# Patient Record
Sex: Female | Born: 1975 | Race: White | Hispanic: No | Smoking: Current every day smoker
Health system: Southern US, Community
[De-identification: ages and names within clinical notes are randomized; demographics above are authoritative.]

## PROBLEM LIST (undated history)

## (undated) DIAGNOSIS — F419 Anxiety disorder, unspecified: Secondary | ICD-10-CM

## (undated) DIAGNOSIS — J449 Chronic obstructive pulmonary disease, unspecified: Secondary | ICD-10-CM

## (undated) DIAGNOSIS — K219 Gastro-esophageal reflux disease without esophagitis: Secondary | ICD-10-CM

## (undated) DIAGNOSIS — J45909 Unspecified asthma, uncomplicated: Secondary | ICD-10-CM

## (undated) DIAGNOSIS — F32A Depression, unspecified: Secondary | ICD-10-CM

## (undated) DIAGNOSIS — I639 Cerebral infarction, unspecified: Secondary | ICD-10-CM

## (undated) HISTORY — PX: BLADDER SURGERY: SHX569

## (undated) HISTORY — PX: CHOLECYSTECTOMY: SHX55

---

## 2002-03-06 ENCOUNTER — Other Ambulatory Visit: Admission: RE | Admit: 2002-03-06 | Discharge: 2002-03-06 | Payer: Self-pay

## 2006-07-25 ENCOUNTER — Ambulatory Visit: Payer: Self-pay | Admitting: Cardiology

## 2006-12-26 ENCOUNTER — Inpatient Hospital Stay (HOSPITAL_COMMUNITY): Admission: EM | Admit: 2006-12-26 | Discharge: 2006-12-30 | Payer: Self-pay | Admitting: Emergency Medicine

## 2006-12-27 ENCOUNTER — Encounter (INDEPENDENT_AMBULATORY_CARE_PROVIDER_SITE_OTHER): Payer: Self-pay | Admitting: Cardiology

## 2006-12-27 ENCOUNTER — Encounter: Payer: Self-pay | Admitting: Vascular Surgery

## 2007-07-04 ENCOUNTER — Ambulatory Visit: Payer: Self-pay | Admitting: Cardiology

## 2010-03-17 ENCOUNTER — Emergency Department (HOSPITAL_COMMUNITY): Admission: EM | Admit: 2010-03-17 | Discharge: 2010-03-17 | Payer: Self-pay | Admitting: Emergency Medicine

## 2010-04-19 ENCOUNTER — Emergency Department (HOSPITAL_COMMUNITY): Admission: EM | Admit: 2010-04-19 | Discharge: 2010-04-19 | Payer: Self-pay | Admitting: Emergency Medicine

## 2010-05-23 ENCOUNTER — Emergency Department (HOSPITAL_COMMUNITY): Admission: EM | Admit: 2010-05-23 | Discharge: 2010-05-23 | Payer: Self-pay | Admitting: Emergency Medicine

## 2010-06-02 ENCOUNTER — Emergency Department (HOSPITAL_COMMUNITY): Admission: EM | Admit: 2010-06-02 | Discharge: 2010-06-02 | Payer: Self-pay | Admitting: Emergency Medicine

## 2010-07-21 ENCOUNTER — Emergency Department (HOSPITAL_COMMUNITY): Admission: EM | Admit: 2010-07-21 | Discharge: 2010-07-21 | Payer: Self-pay | Admitting: Emergency Medicine

## 2011-02-18 ENCOUNTER — Emergency Department (HOSPITAL_COMMUNITY)
Admission: EM | Admit: 2011-02-18 | Discharge: 2011-02-18 | Disposition: A | Discharge status: Home / Self Care | Payer: Medicaid Other | Attending: Emergency Medicine | Admitting: Emergency Medicine

## 2011-02-18 DIAGNOSIS — K089 Disorder of teeth and supporting structures, unspecified: Secondary | ICD-10-CM | POA: Insufficient documentation

## 2011-02-18 DIAGNOSIS — K029 Dental caries, unspecified: Secondary | ICD-10-CM | POA: Insufficient documentation

## 2011-02-18 DIAGNOSIS — J45909 Unspecified asthma, uncomplicated: Secondary | ICD-10-CM | POA: Insufficient documentation

## 2011-02-18 DIAGNOSIS — R51 Headache: Secondary | ICD-10-CM | POA: Insufficient documentation

## 2011-04-30 NOTE — H&P (Signed)
[name], [name] NO.:  [id number]   MEDICAL RECORD NO.:  [id number]          PATIENT TYPE:  EMS   LOCATION:  ED                           FACILITY:  [facility]   PHYSICIAN:  Marcellus Scott, MD     DATE OF BIRTH:  [birth year]   DATE OF ADMISSION:  12/26/2006  DATE OF DISCHARGE:                              HISTORY & PHYSICAL   PRIMARY CARE PHYSICIAN:  Dr. Garner Nash in [place]; hence, is unassigned to  the [facility].   CHIEF COMPLAINT:  Left-sided numbness, weakness, and headache.   HISTORY OF PRESENT ILLNESS:  [name] is a 35 year old pleasant  Caucasian female with a past medical history of asthma and anxiety  disorder, who was in her usual state of health until approximately  midday yesterday.  She noticed a sudden onset of numbness beginning  initially in her left fingers, then gradual progression to involve the  entire left half of the body, neck down.  Initially, this was  intermittent numbness, but subsequently noticed persistent numbness in  the left side of the body associated with weakness, says the left lower  extremity was weaker than the left upper extremity and was dragging her  feet.  She says she also noticed slurring of the speech but denies any  twisting of her mouth.  Also, since last night, she has noticed a severe  10/10 headache of the vertex, which is nonradiating.  It is associated  with nausea.  Patient denies any visual disturbances, difficulty  swallowing.   PAST MEDICAL HISTORY:  1. Asthma/chronic bronchitis.  2. Anxiety disorder.   PAST SURGICAL HISTORY:  1. Status post cholecystectomy in 2006.  2. Bladder lift surgery.   ALLERGIES:  No known drug allergies.   MEDICATIONS:  1. Multivitamins.  2. Advair 1 puff daily.  3. Albuterol inhaler p.r.n.   FAMILY HISTORY:  Dad with history of diabetes.  Mother with history of  CVA and seizure disorder.   SOCIAL HISTORY:  Patient works at [employer] as a Conservation officer, nature.  She  lives  with her boyfriend and two sons, 5 and 45 years old.  The patient  smokes one pack per day since her age of 68.  Social alcohol intake.  Patient denies any substance abuse; however, her urine tox is positive  for tetrahydrocannabinol.   REVIEW OF SYSTEMS:  HEENT:  Patient with headache.  Denies any earache,  sore throat, dysphagia.  No visual disturbances.  The patient with  slurred speech but no mouth twisting.  Patient with no numbness or  tingling of the face.  GENERAL:  Patient feels cold but no fever or  rigors.  RESPIRATORY:  Patient without any dyspnea, cough, wheezing,  chest tightness.  Her asthma attacks are infrequent.  CARDIOVASCULAR:  Patient without any chest pain, palpitations, orthopnea, PND.  ABDOMEN/GI:  Patient with nausea but no vomiting.  No abdominal pain,  constipation, or diarrhea.  GENITOURINARY:  Patient without any  frequency, urgency, dysuria.  CENTRAL NERVOUS SYSTEM:  Per the history  of present illness.  EXTREMITIES:  Per the history of present illness.  MUSCULOSKELETAL:  Without any pain.  SKIN:  Without any rashes.  PSYCHIATRIC:  Without any delusions, hallucinations, suicidal or  homicidal ideations.   PHYSICAL EXAMINATION:  GENERAL:  [name] is a young, moderately  built and nourished female.  Patient in no obvious distress.  VITAL SIGNS:  Temperature 97.5 degrees Fahrenheit, pulse 70 per minute  and regular, respirations 20 per minute, blood pressure 126/66.  HEENT:  Head is normocephalic and atraumatic.  Pupils are symmetrical,  round, and 2 mm equally reacting to light and accommodation.  Extraocular muscle movements are intact.  Visual fields are preserved.  Patient's mucosa is pink and moist.  Anicteric.  Oral cavity has few  missing teeth.  No pharyngeal erythema.  Uvula is midline.  Gag reflex  is preserved.  There is a subtle facial asymmetry with right nasolabial  fold more prominent than the left.  NECK:  There is no JVD, carotid  bruit, lymphadenopathy, or goiter.  RESPIRATORY:  Clear to auscultation bilaterally.  CARDIOVASCULAR:  First and second heart sounds heard, regular.  No third  or fourth heart sounds.  No murmurs, rubs, gallops, or clicks.  ABDOMEN:  Lower abdominal striae.  Nondistended, nontender.  No  organomegaly or mass.  Bowel sounds are preserved.  CENTRAL NERVOUS SYSTEM:  Patient is awake, alert and oriented x3.  Cranial nerves, as indicated above.  EXTREMITIES:  There is no clubbing, cyanosis or edema.  Peripheral  pulses are symmetrical felt.  Patient with normal bulk and tone  bilaterally.  Power is bilateral, 5/5.  There are symmetrical 2+  reflexes in the upper extremities and 3+ in the lower extremities.  Bilateral plantars are flexor.  Patient has a subtle left pronator sign.  There is diminished sensation in the left half of the body from the neck  down, to crude touch as well as fine touch.  SKIN:  Without any rashes.   LAB DATA:  CBC is with hemoglobin of 12.8, hematocrit 40.6, white cells  10.1, platelets 312, MCV 67.  Complete metabolic panel, which is  essentially normal.  Urine toxicology screen, positive for  benzodiazepines and tetrahydrocannabinol.   CT scan of the head, noncontrast, with no acute abnormalities.   ASSESSMENT/PLAN:  Ms. Dalby is a 35 year old female with a history of  asthma, anxiety disorder, who presents with complaints of fairly acute  onset followed by a gradual progression of left-sided numbness and  weakness, with some slurred speech and persistent headache.  1. Rule out cerebrovascular accident:  Will admit the patient to      telemetry.  Will obtain an MRI of the head.  Will obtain neurology      consult.  Will place the patient on aspirin.  Will have neuro      checks q.4h.  Will obtain an echo, carotid doppler, PT & OT eval      and speech therapy eval. 2. Asthma, which is stable.  Continue patient's Advair and p.r.n.      bronchodilator nebs.   3. Anxiety disorder:  Patient at this time does not look anxious.  To      continue her Xanax p.r.n.  4. Substance abuse:  For substance abuse counseling.  5. Tobacco abuse, for nicotine patch as well as tobacco cessation      counseling.  6. Microcytosis: Iron studies.      Marcellus Scott, MD  Electronically Signed     AH/MEDQ  D:  12/26/2006  T:  12/26/2006  Job:  [id number]  cc:   Donzetta Sprung  Fax: [phone]

## 2011-04-30 NOTE — Discharge Summary (Signed)
NAMEGRACYN, [name] NO.:  [id number]   MEDICAL RECORD NO.:  [id number]          PATIENT TYPE:  INP   LOCATION:  1433                         FACILITY:  [facility]   PHYSICIAN:  Michaelyn Barter, M.D. DATE OF BIRTH:  1976/08/28   DATE OF ADMISSION:  12/26/2006  DATE OF DISCHARGE:  12/30/2006                               DISCHARGE SUMMARY   PRIMARY CARE PHYSICIAN:  Dr. Donzetta Sprung of [place].   FINAL DIAGNOSIS:  1. Acute onset left upper and lower extremity weakness with slight      dysarthria most likely secondary to a small-vessel ischemic      cerebrovascular accident.  2. Headache acute on chronic.  3. Episodes of emesis.   PROCEDURES:  1. A CT scan of the head without contrast completed on December 26, 2006.  2. Carotid duplex studies completed December 27, 2006.  3. A 2-D echocardiogram completed December 27, 2006.  4. MRI MRA of the brain completed December 27, 2006.  5. MRI of the cervical spine with and without contrast December 28, 2006.   CONSULTATIONS:  Neurology with Dr. Deneen Harts.   HISTORY OF PRESENT ILLNESS:  [name] is a 35 year old female stated  that she was in a usual state of health until the day prior to this  admission.  She developed sudden onset numbness that started in her left  fingers; and progressed to involve the entire left half of her body from  her neck downwards.  The numbness was intermittent initially, but became  persistent.  She also noticed slurring of her speech.  She went on to  develop a severe headache which was accompanied by nausea.   PAST MEDICAL HISTORY:  Please see that dictated by Dr. Marcellus Scott.   HOSPITAL COURSE BY PROBLEM:  Problem #1:  LEFT SIDE HEMIPLEGIA.  The  etiology of this is most likely secondary to a probable small vessel  CVA/ischemia.  A CT scan of the patient's head was completed on January  14.  It revealed no acute or focal intracranial abnormalities.  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  A MRI and MRA were  completed on December 27, 2006.  The MRI revealed a solitary 5-mm  hyperintensity in the right frontal matter.  This was noted to be  nonspecific; and could be seen with migraine headaches or chronic  ischemia.  The lesion was not typical from MS.  Chronic sinusitis was  also seen.  The MRA revealed no large vessel occlusion or stenosis.  No  aneurysm was detected.  Carotid duplex studies were done on January 15.  They revealed bilateral no significant plaque visualized.  A 60-80% ICA  stenosis by velocities.  Plaque morphology was not consistent with  stenosis.  A 3-D echocardiogram was completed on December 27, 2006.  It  revealed overall left ventricular systolic function to be normal.  Left  ventricular EF was estimated to range between 60-70%.  There was no  diagnostic evidence of left ventricular regional wall motion  abnormalities.  Finally an MRI of the cervical spine without and with  contrast was done on January 16.  This was a negative MRI of cervical  spinal stenosis.   Again, Dr. Nash Shearer, the neurologist, followed the patient.  His  impression on January 18 was that the patient probably had a small  vessel CVA/ischemia.  The patient had been started on aspirin during the  course of this hospitalization.  Her symptoms have improved over the  course of her hospitalization.  Physical therapy has been following the  patient during the course of her hospitalization.  She has regained a  significant amount of strength back on the left side of her body; and  she has actually been noted to be walking up and down the halls; without  a significant amount of difficulties, although she has been using a cane  occasionally.   Problem #2.  HEADACHES.  The etiology of this is questionable.  The  patient has received pain medication on a p.r.n. basis throughout the  course of the hospitalization.  Her headaches have been accompanied by   nausea and vomiting; but, again, these symptoms have been treated with a  course of pain medication and with Phenergan.   CONDITION AT THE TIME OF DISCHARGE:  The patient currently indicates  that she feels better.  Her headaches are better, although not  completely resolved.  Her vitals:  Temperature is 98.3; heart rate 90-  94; respirations 16-20; blood pressure 95/59.  O2 saturation 97% on room  air.   LABS:  Sodium 139, potassium 3.6, chloride 105, CO2 26, BUN 10,  creatinine 0.58, glucose 94, calcium 8.7.   The decision has been made to discharge the patient from the hospital.   DISCHARGED HOME ON THE FOLLOWING MEDICATIONS:  1. Advair 1 puff b.i.d.  2. Combivent 2 puffs q.6 h.  3. Dilaudid 2 mg 1 tablet p.o. q.8 h. p.r.n.  4. Phenergan 25 mg 1 tablet p.o. q.8 h.   The patient will be told to followup with her primary care physician  within the next 1-2 weeks.  She will also be told to call Dr. Nash Shearer  for a followup appointment at [phone] within the next 1-2 months.  If  her headaches do not completely resolve, [facility].  Will  provide the patient with home health PT as well as a cane.      Michaelyn Barter, M.D.  Electronically Signed     OR/MEDQ  D:  12/30/2006  T:  12/30/2006  Job:  [id number]   cc:   Donzetta Sprung  Fax: [phone]

## 2011-04-30 NOTE — Consult Note (Signed)
NAMEAERILYNN, [name] NO.:  [id number]   MEDICAL RECORD NO.:  [id number]          PATIENT TYPE:  INP   LOCATION:  0104                         FACILITY:  [facility]   PHYSICIAN:  Bevelyn Buckles. Champey, M.D.DATE OF BIRTH:  22-Aug-1976   DATE OF CONSULTATION:  DATE OF DISCHARGE:                                 CONSULTATION   REQUESTING PHYSICIAN:  Marcellus Scott, MD.   REASON FOR CONSULTATION:  Stroke.   HISTORY OF PRESENT ILLNESS:  [name] is a 35 year old Caucasian  female with a past medical history of asthma and smoking, who presents  with an overall one day history of left upper extremity greater than  lower extremity weakness and numbness.  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  She also felt that her speech was slurry at that time and had  a slight bifrontal headache.  She denies any visual changes, swallowing  problems, chewing problems, dizziness, vertigo, falls, or loss of  consciousness.   PAST MEDICAL HISTORY:  Positive for asthma.   CURRENT MEDICATIONS:  Include albuterol and Advair.   ALLERGIES:  No known drug allergies.   FAMILY HISTORY:  Positive for stroke, diabetes, and seizures.   SOCIAL HISTORY:  Patient lives with boyfriend and children.  Smokes a  half pack of cigarettes per day.  Denies any alcohol or drug use.   REVIEW OF SYSTEMS:  Positive as per HPI, also for shortness of breath.  Review of systems is negative as per HPI and greater than eight other  organ systems.   PHYSICAL EXAMINATION:  VITALS:  Temperature is 97.5, pulse 70,  respirations 20, blood pressure 126/66.  HEENT:  Normocephalic and atraumatic.  Extraocular muscles are intact.  Pupils are equal, round and reactive to light.  NECK:  Supple.  No carotid  bruits.  HEART:  Regular.  LUNGS:  Clear.  ABDOMEN:  Soft and nontender.  EXTREMITIES:  Good pulses.  NEUROLOGIC:  Patient is awake, alert and oriented.  Language is fluent.  Memory and knowledge are within normal limits.  Cranial nerves II-XII  are grossly intact.  Motor examination shows 5/5 strength on the right,  +4/5 strength on the left.  Patient has normal tone and a slight left  drift.  Sensory examination:  Patient has decreased sensation, left, to  light touch.  When compared to the right, reflexes are +2-3 throughout.  Toes are neutral bilaterally.  Cerebellar function is within normal  limits on finger to nose.  Gait is not assessed secondary to safety.   CT of the head showed no acute findings.   LABS:  CBC, CMET all negative.  Urine drug screen was positive for  benzodiazepines and tetrahydrocannabinol.   IMPRESSION:  This is a 35 year old with left-sided numbness and  weakness, possible small lacunar infarct in the right hemisphere.  Patient is not a candidate for intravenous t-PA, as she  is greater than  three hours out from her onset of symptoms.  I recommend getting an  MRI/MRA of the brain, 2D echocardiogram, carotid Dopplers.  Recommend  starting an aspirin a day, check lipids, homocysteine, and hemoglobin  A1C.  Will get PT/OT, and speech consults.  We will follow patient while  she is in the hospital.      Bevelyn Buckles. Nash Shearer, M.D.  Electronically Signed     DRC/MEDQ  D:  12/26/2006  T:  12/26/2006  Job:  [id number]

## 2011-10-14 ENCOUNTER — Emergency Department (HOSPITAL_COMMUNITY): Payer: Self-pay

## 2011-10-14 ENCOUNTER — Emergency Department (HOSPITAL_COMMUNITY)
Admission: EM | Admit: 2011-10-14 | Discharge: 2011-10-15 | Disposition: A | Discharge status: Home / Self Care | Payer: Self-pay | Attending: Emergency Medicine | Admitting: Emergency Medicine

## 2011-10-14 ENCOUNTER — Encounter: Payer: Self-pay | Admitting: *Deleted

## 2011-10-14 DIAGNOSIS — F17200 Nicotine dependence, unspecified, uncomplicated: Secondary | ICD-10-CM | POA: Insufficient documentation

## 2011-10-14 DIAGNOSIS — R109 Unspecified abdominal pain: Secondary | ICD-10-CM | POA: Insufficient documentation

## 2011-10-14 DIAGNOSIS — R319 Hematuria, unspecified: Secondary | ICD-10-CM | POA: Insufficient documentation

## 2011-10-14 DIAGNOSIS — Z87442 Personal history of urinary calculi: Secondary | ICD-10-CM | POA: Insufficient documentation

## 2011-10-14 DIAGNOSIS — M129 Arthropathy, unspecified: Secondary | ICD-10-CM | POA: Insufficient documentation

## 2011-10-14 DIAGNOSIS — Z8673 Personal history of transient ischemic attack (TIA), and cerebral infarction without residual deficits: Secondary | ICD-10-CM | POA: Insufficient documentation

## 2011-10-14 HISTORY — DX: Cerebral infarction, unspecified: I63.9

## 2011-10-14 LAB — URINALYSIS, ROUTINE W REFLEX MICROSCOPIC
Glucose, UA: NEGATIVE mg/dL
Ketones, ur: NEGATIVE mg/dL
pH: 5.5 (ref 5.0–8.0)

## 2011-10-14 LAB — URINE MICROSCOPIC-ADD ON

## 2011-10-14 LAB — PREGNANCY, URINE: Preg Test, Ur: NEGATIVE

## 2011-10-14 MED ORDER — FENTANYL CITRATE 0.05 MG/ML IJ SOLN
100.0000 ug | Freq: Once | INTRAMUSCULAR | Status: AC
  Administered 2011-10-14: 100 ug via INTRAVENOUS
  Filled 2011-10-14: qty 2

## 2011-10-14 MED ORDER — SODIUM CHLORIDE 0.9 % IJ SOLN
3.0000 mL | Freq: Two times a day (BID) | INTRAMUSCULAR | Status: DC
  Administered 2011-10-14: 3 mL via INTRAVENOUS
  Filled 2011-10-14: qty 3

## 2011-10-14 MED ORDER — KETOROLAC TROMETHAMINE 30 MG/ML IJ SOLN
30.0000 mg | Freq: Once | INTRAMUSCULAR | Status: AC
  Administered 2011-10-14: 30 mg via INTRAVENOUS
  Filled 2011-10-14: qty 1

## 2011-10-14 MED ORDER — HYDROMORPHONE HCL PF 1 MG/ML IJ SOLN
1.0000 mg | Freq: Once | INTRAMUSCULAR | Status: AC
  Administered 2011-10-14: 1 mg via INTRAVENOUS
  Filled 2011-10-14: qty 1

## 2011-10-14 MED ORDER — OXYCODONE-ACETAMINOPHEN 5-325 MG PO TABS: 2.0000 | ORAL_TABLET | ORAL | Status: AC | PRN

## 2011-10-14 MED ORDER — ONDANSETRON HCL 4 MG/2ML IJ SOLN
4.0000 mg | Freq: Once | INTRAMUSCULAR | Status: AC
  Administered 2011-10-14: 4 mg via INTRAVENOUS
  Filled 2011-10-14: qty 2

## 2011-10-14 NOTE — ED Notes (Signed)
Pt reports burning w/ urination & a sharp pain in her back. Pt denies any n/v or fevers.

## 2011-10-14 NOTE — ED Provider Notes (Addendum)
Scribed for Nicole Shi, MD, the patient was seen in room APA16A/APA16A . This chart was scribed by Ellie Lunch.   CSN: [id number] Arrival date & time: 10/14/2011  9:45 PM   First MD Initiated Contact with Patient 10/14/11 2204      Chief Complaint  Patient presents with  . Flank Pain    (Consider location/radiation/quality/duration/timing/severity/associated sxs/prior treatment) HPI [name] is a 35 y.o. female who presents to the Emergency Department complaining of left flank pain starting 3 days ago. Pain is described as constant and rated 10/10 in severity.  Denies fever, n/v/s.  H/o of kidney stone. Pt has treated with 1200 mg of IB profen with no improvement at 16:00 ~ 6 hours ago. Pt denies any abdominal issues.    Past Medical History  Diagnosis Date  . Arthritis   . Stroke     Past Surgical History  Procedure Date  . Cholecystectomy     No family history on file.  History  Substance Use Topics  . Smoking status: Current Everyday Smoker  . Smokeless tobacco: Not on file  . Alcohol Use: No    Review of Systems 10 Systems reviewed and are negative for acute change except as noted in the HPI.   Allergies  Review of patient's allergies indicates no known allergies.  Home Medications   Current Outpatient Rx  Name Route Sig Dispense Refill  . COUGH DM PO Oral Take 5-10 mLs by mouth as needed. For cold--NAME UNKNOWN       BP 128/73  Pulse 92  Temp(Src) 97.9 F (36.6 C) (Oral)  Ht 4\' 9"  (1.448 m)  Wt 114 lb (51.71 kg)  BMI 24.67 kg/m2  SpO2 99%  LMP 10/10/2011  Physical Exam  Nursing note and vitals reviewed. Constitutional: She is oriented to person, place, and time. She appears well-developed and well-nourished.  HENT:  Head: Normocephalic and atraumatic.  Eyes: Conjunctivae are normal. No scleral icterus.  Neck: Neck supple.  Cardiovascular: Normal rate, regular rhythm and normal heart sounds.   Pulmonary/Chest: Effort normal and  breath sounds normal.  Abdominal: Soft. There is no tenderness.       No localized tenderness.   Musculoskeletal: Normal range of motion. She exhibits no tenderness.  Neurological: She is alert and oriented to person, place, and time.  Skin: Skin is warm and dry.  Psychiatric: She has a normal mood and affect.    ED Course  Procedures (including critical care time) OTHER DATA REVIEWED: Nursing notes, vital signs, and past medical records reviewed.   DIAGNOSTIC STUDIES: Oxygen Saturation is 99% on room air, normal by my interpretation.     Labs Reviewed  URINALYSIS, ROUTINE W REFLEX MICROSCOPIC - Abnormal; Notable for the following:    Specific Gravity, Urine >1.030 (*)    Hgb urine dipstick LARGE (*)    Leukocytes, UA TRACE (*)    All other components within normal limits  URINE MICROSCOPIC-ADD ON - Abnormal; Notable for the following:    Squamous Epithelial / LPF MANY (*)    Bacteria, UA FEW (*)    All other components within normal limits  PREGNANCY, URINE   No results found.  ED MEDICATIONS  Medications  fentaNYL (SUBLIMAZE) injection 100 mcg   ondansetron (ZOFRAN) injection 4 mg   sodium chloride 0.9 % injection 3 mL   ketorolac (TORADOL) 30 MG/ML injection 30 mg    No diagnosis found.    MDM  I personally performed the services described in this  documentation, which was scribed in my presence. The recorded information has been reviewed and considered.        Nicole Shi, MD 10/14/11 [id number]  Nicole Shi, MD 10/14/11 2224

## 2011-10-14 NOTE — ED Notes (Signed)
Pt states pain medication has not helped at this time.

## 2011-10-14 NOTE — ED Notes (Signed)
Pt reports bilateral flank pain x 2 days

## 2011-10-15 NOTE — ED Notes (Signed)
Pt given discharge instructions, paperwork & prescription(s), pt verbalized understanding.   

## 2011-11-23 ENCOUNTER — Emergency Department (HOSPITAL_COMMUNITY): Payer: Self-pay

## 2011-11-23 ENCOUNTER — Emergency Department (HOSPITAL_COMMUNITY)
Admission: EM | Admit: 2011-11-23 | Discharge: 2011-11-23 | Disposition: A | Discharge status: Home / Self Care | Payer: Self-pay | Attending: Emergency Medicine | Admitting: Emergency Medicine

## 2011-11-23 ENCOUNTER — Encounter (HOSPITAL_COMMUNITY): Payer: Self-pay

## 2011-11-23 DIAGNOSIS — F17200 Nicotine dependence, unspecified, uncomplicated: Secondary | ICD-10-CM | POA: Insufficient documentation

## 2011-11-23 DIAGNOSIS — Z8673 Personal history of transient ischemic attack (TIA), and cerebral infarction without residual deficits: Secondary | ICD-10-CM | POA: Insufficient documentation

## 2011-11-23 DIAGNOSIS — R109 Unspecified abdominal pain: Secondary | ICD-10-CM | POA: Insufficient documentation

## 2011-11-23 LAB — URINALYSIS, ROUTINE W REFLEX MICROSCOPIC: Leukocytes, UA: NEGATIVE

## 2011-11-23 LAB — URINE MICROSCOPIC-ADD ON

## 2011-11-23 MED ORDER — HYDROMORPHONE HCL PF 1 MG/ML IJ SOLN
1.0000 mg | Freq: Once | INTRAMUSCULAR | Status: AC
  Administered 2011-11-23: 1 mg via INTRAVENOUS
  Filled 2011-11-23: qty 1

## 2011-11-23 MED ORDER — SODIUM CHLORIDE 0.9 % IV SOLN
INTRAVENOUS | Status: DC
  Administered 2011-11-23: 15:00:00 via INTRAVENOUS

## 2011-11-23 MED ORDER — ONDANSETRON HCL 4 MG PO TABS: 4.0000 mg | ORAL_TABLET | Freq: Four times a day (QID) | ORAL | Status: AC

## 2011-11-23 MED ORDER — SODIUM CHLORIDE 0.9 % IV BOLUS (SEPSIS)
1000.0000 mL | Freq: Once | INTRAVENOUS | Status: AC
  Administered 2011-11-23: 1000 mL via INTRAVENOUS

## 2011-11-23 MED ORDER — OXYCODONE-ACETAMINOPHEN 5-325 MG PO TABS: 2.0000 | ORAL_TABLET | ORAL | Status: AC | PRN

## 2011-11-23 MED ORDER — ONDANSETRON HCL 4 MG/2ML IJ SOLN
4.0000 mg | Freq: Once | INTRAMUSCULAR | Status: AC
  Administered 2011-11-23: 4 mg via INTRAVENOUS
  Filled 2011-11-23: qty 2

## 2011-11-23 MED ORDER — KETOROLAC TROMETHAMINE 30 MG/ML IJ SOLN
30.0000 mg | Freq: Once | INTRAMUSCULAR | Status: AC
  Administered 2011-11-23: 30 mg via INTRAVENOUS
  Filled 2011-11-23: qty 1

## 2011-11-23 NOTE — ED Provider Notes (Signed)
History    This chart was scribed for Nicole Hutching, MD, MD by Smitty Pluck. The patient was seen in room APA11 and the patient's care was started at 12:06PM.   CSN: [id number] Arrival date & time: 11/23/2011 11:33 AM   First MD Initiated Contact with Patient 11/23/11 1138      Chief Complaint  Patient presents with  . Flank Pain    (Consider location/radiation/quality/duration/timing/severity/associated sxs/prior treatment) Patient is a 35 y.o. female presenting with flank pain. The history is provided by the patient.  Flank Pain   FEDORA KNISELY is a 35 y.o. female who presents to the Emergency Department complaining of moderate left flank pain onset 1 day but pain is worse today. Pt reports having dysuria and decreased urination. Pt denies nausea, vomiting and diarrhea.  Nothing makes pain better or worse.  Pain is cramping    Past Medical History  Diagnosis Date  . Stroke     Past Surgical History  Procedure Date  . Cholecystectomy     No family history on file.  History  Substance Use Topics  . Smoking status: Current Everyday Smoker  . Smokeless tobacco: Not on file  . Alcohol Use: No    OB History    Grav Para Term Preterm Abortions TAB SAB Ect Mult Living                  Review of Systems  Genitourinary: Positive for flank pain.  All other systems reviewed and are negative.    Allergies  Review of patient's allergies indicates no known allergies.  Home Medications   Current Outpatient Rx  Name Route Sig Dispense Refill  . COUGH DM PO Oral Take 5-10 mLs by mouth as needed. For cold--NAME UNKNOWN       BP 120/70  Pulse 140  Temp(Src) 98.3 F (36.8 C) (Oral)  Resp 20  Ht 4\' 9"  (1.448 m)  Wt 114 lb (51.71 kg)  BMI 24.67 kg/m2  SpO2 100%  LMP 11/03/2011  Physical Exam  Nursing note and vitals reviewed. Constitutional: She is oriented to person, place, and time. She appears well-developed and well-nourished.  HENT:  Head: Normocephalic  and atraumatic.  Eyes: Conjunctivae and EOM are normal. Pupils are equal, round, and reactive to light.  Neck: Normal range of motion. Neck supple.  Cardiovascular: Normal rate and regular rhythm.   Pulmonary/Chest: Effort normal and breath sounds normal.  Abdominal: Soft. Bowel sounds are normal. There is no tenderness.  Musculoskeletal: Normal range of motion. Tenderness: left flank.  Neurological: She is alert and oriented to person, place, and time.  Skin: Skin is warm and dry.  Psychiatric: She has a normal mood and affect.    ED Course  Procedures (including critical care time)  DIAGNOSTIC STUDIES: Oxygen Saturation is 100% on room air, normal by my interpretation.    COORDINATION OF CARE: Pt will receive CT abdomen, pain medication and lab tests. Ddx kidney stones  2:08 PM: recheck. Pt is still feeling pain. EDP ordered CT abd  Labs Reviewed  URINALYSIS, ROUTINE W REFLEX MICROSCOPIC - Abnormal; Notable for the following:    Hgb urine dipstick MODERATE (*)    Ketones, ur TRACE (*)    Protein, ur TRACE (*)    All other components within normal limits  URINE MICROSCOPIC-ADD ON - Abnormal; Notable for the following:    Squamous Epithelial / LPF MANY (*)    All other components within normal limits  PREGNANCY, URINE   Ct  Abdomen Pelvis Wo Contrast  11/23/2011  *RADIOLOGY REPORT*  Clinical Data: Rule out left kidney stone.  Left flank pain for 1 day.  No prior history of stones.  CT ABDOMEN AND PELVIS WITHOUT CONTRAST  Technique:  Multidetector CT imaging of the abdomen and pelvis was performed following the standard protocol without intravenous contrast.  Comparison: 10/14/2011  Findings: The lung bases are negative.  No renal or intrarenal calculi identified.  No focal abnormality identified within the liver, spleen, pancreas, adrenal glands, or kidneys.  The patient has had a cholecystectomy. No evidence for dilated intrahepatic biliary ducts.  Stomach and visualized bowel  loops are normal in caliber and wall thickness.  No evidence for aortic aneurysm. The appendix is well seen and has a normal appearance.  The uterus is present.  There are bilateral fallopian tube clips. No evidence for free pelvic fluid or pelvic adenopathy. Visualized osseous structures have a normal appearance.  IMPRESSION:  1.  No evidence for urinary tract obstruction. 2.  No evidence for acute abnormality in the abdomen pelvis.  Original Report Authenticated By: Patterson Hammersmith, M.D.     No diagnosis found.    MDM  History and physical consistent with kidney stone. Urinalysis shows hemoglobin.  See CT scan shows no obvious stone.  Feeling better after pain medicine. I personally performed the services described in this documentation, which was scribed in my presence. The recorded information has been reviewed and considered.       Nicole Hutching, MD 11/23/11 [phone]

## 2011-11-23 NOTE — ED Notes (Signed)
Pt presents with left flank pain and decreased urination x 2 days. Pt denies n/v/d.

## 2011-12-21 ENCOUNTER — Emergency Department (HOSPITAL_COMMUNITY)
Admission: EM | Admit: 2011-12-21 | Discharge: 2011-12-21 | Disposition: A | Discharge status: Home / Self Care | Payer: Self-pay | Attending: Emergency Medicine | Admitting: Emergency Medicine

## 2011-12-21 ENCOUNTER — Emergency Department (HOSPITAL_COMMUNITY): Payer: Self-pay

## 2011-12-21 ENCOUNTER — Encounter (HOSPITAL_COMMUNITY): Payer: Self-pay

## 2011-12-21 DIAGNOSIS — R10812 Left upper quadrant abdominal tenderness: Secondary | ICD-10-CM | POA: Insufficient documentation

## 2011-12-21 DIAGNOSIS — F17200 Nicotine dependence, unspecified, uncomplicated: Secondary | ICD-10-CM | POA: Insufficient documentation

## 2011-12-21 DIAGNOSIS — Z87442 Personal history of urinary calculi: Secondary | ICD-10-CM | POA: Insufficient documentation

## 2011-12-21 DIAGNOSIS — R319 Hematuria, unspecified: Secondary | ICD-10-CM | POA: Insufficient documentation

## 2011-12-21 DIAGNOSIS — Z9889 Other specified postprocedural states: Secondary | ICD-10-CM | POA: Insufficient documentation

## 2011-12-21 DIAGNOSIS — Z7982 Long term (current) use of aspirin: Secondary | ICD-10-CM | POA: Insufficient documentation

## 2011-12-21 DIAGNOSIS — R109 Unspecified abdominal pain: Secondary | ICD-10-CM | POA: Insufficient documentation

## 2011-12-21 DIAGNOSIS — Z8673 Personal history of transient ischemic attack (TIA), and cerebral infarction without residual deficits: Secondary | ICD-10-CM | POA: Insufficient documentation

## 2011-12-21 LAB — BASIC METABOLIC PANEL
BUN: 11 mg/dL (ref 6–23)
Calcium: 9.7 mg/dL (ref 8.4–10.5)
GFR calc non Af Amer: 82 mL/min — ABNORMAL LOW (ref >90)
Glucose, Bld: 96 mg/dL (ref 70–99)

## 2011-12-21 LAB — DIFFERENTIAL
Eosinophils Absolute: 0.3 10*3/uL (ref 0.0–0.7)
Eosinophils Relative: 4 % (ref 0–5)
Lymphs Abs: 3 10*3/uL (ref 0.7–4.0)
Monocytes Absolute: 0.7 10*3/uL (ref 0.1–1.0)
Monocytes Relative: 8 % (ref 3–12)

## 2011-12-21 LAB — URINALYSIS, ROUTINE W REFLEX MICROSCOPIC
Bilirubin Urine: NEGATIVE
Ketones, ur: NEGATIVE mg/dL
Leukocytes, UA: NEGATIVE
Protein, ur: NEGATIVE mg/dL
Urobilinogen, UA: 0.2 mg/dL (ref 0.0–1.0)

## 2011-12-21 LAB — CBC
Hemoglobin: 10.6 g/dL — ABNORMAL LOW (ref 12.0–15.0)
MCH: 20.7 pg — ABNORMAL LOW (ref 26.0–34.0)
MCV: 63.9 fL — ABNORMAL LOW (ref 78.0–100.0)
RBC: 5.12 MIL/uL — ABNORMAL HIGH (ref 3.87–5.11)

## 2011-12-21 MED ORDER — TRAMADOL HCL 50 MG PO TABS: 50.0000 mg | ORAL_TABLET | Freq: Four times a day (QID) | ORAL | Status: AC | PRN

## 2011-12-21 MED ORDER — PROMETHAZINE HCL 25 MG PO TABS: 25.0000 mg | ORAL_TABLET | Freq: Four times a day (QID) | ORAL | Status: DC | PRN

## 2011-12-21 MED ORDER — MORPHINE SULFATE 4 MG/ML IJ SOLN
4.0000 mg | Freq: Once | INTRAMUSCULAR | Status: AC
  Administered 2011-12-21: 4 mg via INTRAVENOUS
  Filled 2011-12-21: qty 1

## 2011-12-21 MED ORDER — ONDANSETRON HCL 4 MG/2ML IJ SOLN
4.0000 mg | Freq: Once | INTRAMUSCULAR | Status: AC
  Administered 2011-12-21: 4 mg via INTRAVENOUS
  Filled 2011-12-21: qty 2

## 2011-12-21 MED ORDER — SODIUM CHLORIDE 0.9 % IV BOLUS (SEPSIS)
500.0000 mL | Freq: Once | INTRAVENOUS | Status: AC
  Administered 2011-12-21: 500 mL via INTRAVENOUS

## 2011-12-21 MED ORDER — KETOROLAC TROMETHAMINE 30 MG/ML IJ SOLN
30.0000 mg | Freq: Once | INTRAMUSCULAR | Status: AC
  Administered 2011-12-21: 30 mg via INTRAVENOUS
  Filled 2011-12-21: qty 1

## 2011-12-21 NOTE — ED Notes (Signed)
Patient with no complaints at this time. Respirations even and unlabored. Skin warm/dry. Discharge instructions reviewed with patient at this time. Patient given opportunity to voice concerns/ask questions. IV removed per policy and band-aid applied to site. Patient discharged at this time and left Emergency Department with steady gait.  

## 2011-12-21 NOTE — ED Notes (Signed)
Patient c/o 8/10 pain on NPS after medications. EDP aware. Order for morphine obtained.

## 2011-12-21 NOTE — ED Notes (Signed)
Patient to CT at this time. Will reassess pain when patient returns.

## 2011-12-21 NOTE — ED Provider Notes (Signed)
History   Scribed for Nicholes Stairs, MD, the patient was seen in APA05/APA05. The chart was scribed by Gilman Schmidt. The patients care was started at 7:54 AM.   CSN: [id number]  Arrival date & time 12/21/11  0743   First MD Initiated Contact with Patient 12/21/11 0750      Chief Complaint  Patient presents with  . Flank Pain    (Consider location/radiation/quality/duration/timing/severity/associated sxs/prior treatment) HPI [name] is a 36 y.o. female with a history of stroke and kidney stones who presents to the Emergency Department complaining of left sided flank pain onset last night. Pt reports being seen in the ED on 12/11 with kidney stone. States she passed it here but now has another one. Notes passing kidney stone yesterday morning. Also notes hematuria. Denies any nausea, vomiting, or fever. Denies any other medical conditions. There are no other associated symptoms and no other alleviating or aggravating factors.   Past Medical History  Diagnosis Date  . Stroke     Past Surgical History  Procedure Date  . Cholecystectomy     No family history on file.  History  Substance Use Topics  . Smoking status: Current Everyday Smoker  . Smokeless tobacco: Not on file  . Alcohol Use: No    OB History    Grav Para Term Preterm Abortions TAB SAB Ect Mult Living                  Review of Systems  Constitutional: Negative for fever.  Gastrointestinal: Negative for nausea and vomiting.  Genitourinary: Positive for hematuria and flank pain.  All other systems reviewed and are negative.    Allergies  Review of patient's allergies indicates no known allergies.  Home Medications   Current Outpatient Rx  Name Route Sig Dispense Refill  . ALBUTEROL SULFATE HFA 108 (90 BASE) MCG/ACT IN AERS Inhalation Inhale 2 puffs into the lungs every 6 (six) hours as needed. Asthma     . ASPIRIN EC 81 MG PO TBEC Oral Take 81 mg by mouth daily.      . IBUPROFEN 200 MG PO  TABS Oral Take 800 mg by mouth every 6 (six) hours as needed. Pain     . THERA M PLUS PO TABS Oral Take 1 tablet by mouth daily.        LMP 11/03/2011  Physical Exam  Nursing note and vitals reviewed. Constitutional: She is oriented to person, place, and time. She appears well-developed and well-nourished.  Non-toxic appearance. She does not have a sickly appearance.  HENT:  Head: Normocephalic and atraumatic.  Eyes: Conjunctivae, EOM and lids are normal. Pupils are equal, round, and reactive to light. No scleral icterus.  Neck: Trachea normal and normal range of motion. Neck supple.  Cardiovascular: Normal rate, regular rhythm and normal heart sounds.   No murmur heard. Pulmonary/Chest: Effort normal and breath sounds normal.  Abdominal: Soft. Normal appearance. There is tenderness (mild) in the left upper quadrant. There is no rebound, no guarding and no CVA tenderness.  Musculoskeletal: Normal range of motion.  Neurological: She is alert and oriented to person, place, and time. She has normal strength.  Skin: Skin is warm, dry and intact. No rash noted.    ED Course  Procedures (including critical care time) The patient is a 36 year old female, with a history of kidney stones.  She presents with left-sided flank pain, hematuria, nausea since yesterday.  She appears to be mildly uncomfortable with mild left-sided abdominal  pain.  We will establish an IV give her analgesics, and perform laboratory testing, and a CAT scan  Labs Reviewed - No data to display No results found.   No diagnosis found.  DIAGNOSTIC STUDIES: Oxygen Saturation is 99% on room air, normal by my interpretation.    Results for orders placed during the hospital encounter of 12/21/11  URINALYSIS, ROUTINE W REFLEX MICROSCOPIC      Component Value Range   Color, Urine YELLOW  YELLOW    APPearance CLEAR  CLEAR    Specific Gravity, Urine 1.010  1.005 - 1.030    pH 6.0  5.0 - 8.0    Glucose, UA NEGATIVE   NEGATIVE (mg/dL)   Hgb urine dipstick MODERATE (*) NEGATIVE    Bilirubin Urine NEGATIVE  NEGATIVE    Ketones, ur NEGATIVE  NEGATIVE (mg/dL)   Protein, ur NEGATIVE  NEGATIVE (mg/dL)   Urobilinogen, UA 0.2  0.0 - 1.0 (mg/dL)   Nitrite NEGATIVE  NEGATIVE    Leukocytes, UA NEGATIVE  NEGATIVE   CBC      Component Value Range   WBC 8.2  4.0 - 10.5 (K/uL)   RBC 5.12 (*) 3.87 - 5.11 (MIL/uL)   Hemoglobin 10.6 (*) 12.0 - 15.0 (g/dL)   HCT 16.1 (*) 09.6 - 46.0 (%)   MCV 63.9 (*) 78.0 - 100.0 (fL)   MCH 20.7 (*) 26.0 - 34.0 (pg)   MCHC 32.4  30.0 - 36.0 (g/dL)   RDW 04.5  40.9 - 81.1 (%)   Platelets 316  150 - 400 (K/uL)  DIFFERENTIAL      Component Value Range   Neutrophils Relative 51  43 - 77 (%)   Neutro Abs 4.2  1.7 - 7.7 (K/uL)   Lymphocytes Relative 37  12 - 46 (%)   Lymphs Abs 3.0  0.7 - 4.0 (K/uL)   Monocytes Relative 8  3 - 12 (%)   Monocytes Absolute 0.7  0.1 - 1.0 (K/uL)   Eosinophils Relative 4  0 - 5 (%)   Eosinophils Absolute 0.3  0.0 - 0.7 (K/uL)   Basophils Relative 1  0 - 1 (%)   Basophils Absolute 0.0  0.0 - 0.1 (K/uL)  BASIC METABOLIC PANEL      Component Value Range   Sodium 138  135 - 145 (mEq/L)   Potassium 3.4 (*) 3.5 - 5.1 (mEq/L)   Chloride 102  96 - 112 (mEq/L)   CO2 28  19 - 32 (mEq/L)   Glucose, Bld 96  70 - 99 (mg/dL)   BUN 11  6 - 23 (mg/dL)   Creatinine, Ser 9.14  0.50 - 1.10 (mg/dL)   Calcium 9.7  8.4 - 78.2 (mg/dL)   GFR calc non Af Amer 82 (*) >90 (mL/min)   GFR calc Af Amer >90  >90 (mL/min)  PREGNANCY, URINE      Component Value Range   Preg Test, Ur NEGATIVE    URINE MICROSCOPIC-ADD ON      Component Value Range   Squamous Epithelial / LPF MANY (*) RARE    WBC, UA 3-6  <3 (WBC/hpf)   RBC / HPF 21-50  <3 (RBC/hpf)   Bacteria, UA FEW (*) RARE    Radiology: CT Abdomen Pelvis Wo Contrast. Reviewed by me. IMPRESSION: No renal, ureteral, or bladder calculi. No hydronephrosis. No CT findings to account for the patient's abdominal  pain. Original Report Authenticated By: Charline Bills, M.D.   COORDINATION OF CARE: 7:54am:  Patient evaluated by ED  physician, Zofran, Morphine, Toradol, CT Abdomen, Pregnancy urine, CBC, Diff, BMP, UA ordered  9:41 AM sxs improved.    MDM  Ureteral colic No stones, remained.  No signs of urinary tract infection.  Symptoms improved, not toxic.  No renal insufficiency  I personally performed the services described in this documentation, which was scribed in my presence. The recorded information has been reviewed and considered.         Nicholes Stairs, MD 12/21/11 [phone]

## 2011-12-21 NOTE — ED Notes (Signed)
Patient medicated for pain per MD orders. Patient informed of CT scan. Denies any other needs at present. Will reassess pain at 0845.

## 2011-12-21 NOTE — ED Notes (Signed)
Pt reports being here on 12/11 with kidney stone.  Pt reports "passing" it here but now has another one.  Pt reports severe pain to left flank and hematuria since last night.

## 2013-09-18 ENCOUNTER — Emergency Department (HOSPITAL_COMMUNITY)
Admission: EM | Admit: 2013-09-18 | Discharge: 2013-09-18 | Disposition: A | Discharge status: Home / Self Care | Payer: Self-pay | Attending: Emergency Medicine | Admitting: Emergency Medicine

## 2013-09-18 ENCOUNTER — Encounter (HOSPITAL_COMMUNITY): Payer: Self-pay | Admitting: *Deleted

## 2013-09-18 DIAGNOSIS — Z8673 Personal history of transient ischemic attack (TIA), and cerebral infarction without residual deficits: Secondary | ICD-10-CM | POA: Insufficient documentation

## 2013-09-18 DIAGNOSIS — K029 Dental caries, unspecified: Secondary | ICD-10-CM | POA: Insufficient documentation

## 2013-09-18 DIAGNOSIS — Z87891 Personal history of nicotine dependence: Secondary | ICD-10-CM | POA: Insufficient documentation

## 2013-09-18 DIAGNOSIS — K0889 Other specified disorders of teeth and supporting structures: Secondary | ICD-10-CM

## 2013-09-18 DIAGNOSIS — Z7982 Long term (current) use of aspirin: Secondary | ICD-10-CM | POA: Insufficient documentation

## 2013-09-18 DIAGNOSIS — Z79899 Other long term (current) drug therapy: Secondary | ICD-10-CM | POA: Insufficient documentation

## 2013-09-18 DIAGNOSIS — H9209 Otalgia, unspecified ear: Secondary | ICD-10-CM | POA: Insufficient documentation

## 2013-09-18 MED ORDER — HYDROCODONE-ACETAMINOPHEN 5-325 MG PO TABS: 1.0000 | ORAL_TABLET | ORAL | Status: DC | PRN

## 2013-09-18 MED ORDER — PENICILLIN V POTASSIUM 500 MG PO TABS: 1000.0000 mg | ORAL_TABLET | Freq: Two times a day (BID) | ORAL | Status: DC

## 2013-09-18 NOTE — ED Provider Notes (Signed)
CSN: [id number]     Arrival date & time 09/18/13  7829 History   This chart was scribed for Audree Camel, MD, by Yevette Edwards, ED Scribe. This patient was seen in room APA03/APA03 and the patient's care was started at 7:25 AM. First MD Initiated Contact with Patient 09/18/13 [phone]     Chief Complaint  Patient presents with  . Dental Pain    The history is provided by the patient. No language interpreter was used.   HPI Comments: [name] is a 37 y.o. female who presents to the Emergency Department complaining of constant, "aching" right-sided dental pain to her lower teeth which began two days ago. She rates the pain as 10/10 stating the pain has interfered with her ability to eat. However, she states she is able to open her mouth without difficulty. She reports the pain seems to radiate to her right ear. She denies any fever, chills, drainage from the site, or facial swelling. The pt has a h/o abscesses to her teeth and this feels the same. She is a former smoker.   She does not have a primary dentist.    Past Medical History  Diagnosis Date  . Stroke    Past Surgical History  Procedure Laterality Date  . Cholecystectomy    . Bladder surgery     No family history on file. History  Substance Use Topics  . Smoking status: Former Smoker    Quit date: 06/18/2013  . Smokeless tobacco: Not on file  . Alcohol Use: No   No OB history provided.  Review of Systems  Constitutional: Negative for fever and chills.  HENT: Positive for ear pain and dental problem. Negative for facial swelling, trouble swallowing and voice change.   Respiratory: Negative for shortness of breath.   All other systems reviewed and are negative.    Allergies  Review of patient's allergies indicates no known allergies.  Home Medications   Current Outpatient Rx  Name  Route  Sig  Dispense  Refill  . albuterol (PROVENTIL HFA;VENTOLIN HFA) 108 (90 BASE) MCG/ACT inhaler   Inhalation   Inhale 2  puffs into the lungs every 6 (six) hours as needed. For shortness of breath         . aspirin EC 81 MG tablet   Oral   Take 81 mg by mouth daily.           Marland Kitchen ibuprofen (ADVIL,MOTRIN) 200 MG tablet   Oral   Take 800 mg by mouth every 6 (six) hours as needed. Pain          . Multiple Vitamin (MULITIVITAMIN WITH MINERALS) TABS   Oral   Take 1 tablet by mouth daily.            Triage Vitals: BP 120/80  Pulse 95  Temp(Src) 97.7 F (36.5 C) (Oral)  Resp 16  Ht 4\' 9"  (1.448 m)  Wt 125 lb (56.7 kg)  BMI 27.04 kg/m2  SpO2 100%  LMP 09/16/2013  Physical Exam  Nursing note and vitals reviewed. Constitutional: She is oriented to person, place, and time. She appears well-developed and well-nourished. No distress.  HENT:  Head: Normocephalic and atraumatic.  Right Ear: External ear normal.  Left Ear: External ear normal.  Nose: Nose normal.  Mouth/Throat: Uvula is midline and oropharynx is clear and moist. No trismus in the jaw. Abnormal dentition. Dental caries present. No dental abscesses or edematous. No oropharyngeal exudate.  All maxillary teeth have been removed.  She has had all of her mandibular teeth removed. She has tenderness to right mandibular gum line near her 2 most posterior teet, but no swelling, induration or fluctuance. No signs of drainable abscess  Eyes: EOM are normal.  Neck: Neck supple. No tracheal deviation present.  Cardiovascular: Normal rate, regular rhythm and normal heart sounds.   No murmur heard. Pulmonary/Chest: Effort normal and breath sounds normal. No respiratory distress. She has no wheezes.  Abdominal: She exhibits no distension.  Lymphadenopathy:    She has no cervical adenopathy.  Neurological: She is alert and oriented to person, place, and time.  Skin: Skin is warm and dry.  Psychiatric: She has a normal mood and affect. Her behavior is normal.    ED Course  Procedures (including critical care time)  DIAGNOSTIC STUDIES: Oxygen  Saturation is  100% on room air, normal by my interpretation.    COORDINATION OF CARE:  7:31 AM- Discussed treatment plan with patient, and the patient agreed to the plan.   Labs Review Labs Reviewed - No data to display Imaging Review No results found.  MDM   1. Pain, dental   2. Dental caries    Patient is describing symptoms of an early dental abscess, but no drainable collection. She has no trismus, or change, or signs of Ludwig angina. Denies systemic symptoms and is otherwise well appearing. Discussed the importance of getting a dentist. Will cover with antibiotics and short course of pain medicines. I offered her a dental block which she declined.  I personally performed the services described in this documentation, which was scribed in my presence. The recorded information has been reviewed and is accurate.    Audree Camel, MD 09/18/13 [phone]

## 2013-09-18 NOTE — ED Notes (Signed)
Dental pain and swelling to right lower side since yesterday.

## 2013-09-18 NOTE — Discharge Instructions (Signed)
Dental Assistance If unable to pay or uninsured, contact:  [facility]. to become qualified for the adult dental clinic.  Patients with Medicaid: [facility] [phone] [address], [phone] [address] [phone]  If unable to pay, or uninsured, contact [facility] [phone] in [place], [phone] in [facility]) to become qualified for the adult dental clinic  [facility] [address] [place], [place] [address] [phone] [email or web address]  Other [employer] Services: - [facility]- [address] [place], [place], [place], [address], [phone], Ext. [phone], 2nd and 4th Thursday of the month at 6:30am.  10 clients each day by appointment, can sometimes see walk-in patients if someone does not show for an appointment. [facility]- [address] [place], [place], [address], [phone] - [facility] [address] [place], [place], [address], [phone] - [place]- [phone] [facility] Department- [phone] [facility] Department- [phone]    [facility]  [employer] [facility] Dept. [address]                 [address]         [address] [place] [address]  Blondell Reveal Phone:  [phone]                                  Phone:  [phone]                   Phone:  [phone]  [facility], [phone] - [facility]- [phone]       -     [facility] in [place], [address],             [phone], Insurance  [place] Child Abuse Hotline 5171876477 or [phone] (After Hours)    Dental Pain A tooth ache may be caused by cavities (tooth decay). Cavities expose the nerve of the  tooth to air and hot or cold temperatures. It may come from an infection or abscess (also called a boil or furuncle) around your tooth. It is also often caused by dental caries (tooth decay). This causes the pain you are having. DIAGNOSIS  Your caregiver can diagnose this problem by exam. TREATMENT   If caused by an infection, it may be treated with medications which kill germs (antibiotics) and pain medications as prescribed by your caregiver. Take medications as directed.  Only take over-the-counter or prescription medicines for pain, discomfort, or fever as directed by your caregiver.  Whether the tooth ache today is caused by infection or dental disease, you should see your dentist as soon as possible for further care. SEEK MEDICAL CARE IF: The exam and treatment you received today has been provided on an emergency basis only. This is not a substitute for complete medical or  dental care. If your problem worsens or new problems (symptoms) appear, and you are unable to meet with your dentist, call or return to this location. SEEK IMMEDIATE MEDICAL CARE IF:   You have a fever.  You develop redness and swelling of your face, jaw, or neck.  You are unable to open your mouth.  You have severe pain uncontrolled by pain medicine. MAKE SURE YOU:   Understand these instructions.  Will watch your condition.  Will get help right away if you are not doing well or get worse. Document Released: 11/29/2005 Document Revised: 02/21/2012 Document Reviewed: 07/17/2008 [facility] Patient Information 2014 [place], [place].

## 2014-02-22 ENCOUNTER — Emergency Department (HOSPITAL_COMMUNITY)
Admission: EM | Admit: 2014-02-22 | Discharge: 2014-02-22 | Disposition: A | Discharge status: Home / Self Care | Payer: Self-pay | Attending: Emergency Medicine | Admitting: Emergency Medicine

## 2014-02-22 ENCOUNTER — Encounter (HOSPITAL_COMMUNITY): Payer: Self-pay | Admitting: Emergency Medicine

## 2014-02-22 DIAGNOSIS — K0889 Other specified disorders of teeth and supporting structures: Secondary | ICD-10-CM

## 2014-02-22 DIAGNOSIS — Z8673 Personal history of transient ischemic attack (TIA), and cerebral infarction without residual deficits: Secondary | ICD-10-CM | POA: Insufficient documentation

## 2014-02-22 DIAGNOSIS — K1370 Unspecified lesions of oral mucosa: Secondary | ICD-10-CM | POA: Insufficient documentation

## 2014-02-22 DIAGNOSIS — J45909 Unspecified asthma, uncomplicated: Secondary | ICD-10-CM | POA: Insufficient documentation

## 2014-02-22 DIAGNOSIS — K089 Disorder of teeth and supporting structures, unspecified: Secondary | ICD-10-CM | POA: Insufficient documentation

## 2014-02-22 DIAGNOSIS — K029 Dental caries, unspecified: Secondary | ICD-10-CM | POA: Insufficient documentation

## 2014-02-22 HISTORY — DX: Unspecified asthma, uncomplicated: J45.909

## 2014-02-22 MED ORDER — AMOXICILLIN 500 MG PO CAPS: 500.0000 mg | ORAL_CAPSULE | Freq: Three times a day (TID) | ORAL | Status: DC

## 2014-02-22 MED ORDER — TRAMADOL HCL 50 MG PO TABS: 50.0000 mg | ORAL_TABLET | Freq: Four times a day (QID) | ORAL | Status: DC | PRN

## 2014-02-22 NOTE — Discharge Instructions (Signed)
Dental Pain °Toothache is pain in or around a tooth. It may get worse with chewing or with cold or heat.  °HOME CARE °· Your dentist may use a numbing medicine during treatment. If so, you may need to avoid eating until the medicine wears off. Ask your dentist about this. °· Only take medicine as told by your dentist or doctor. °· Avoid chewing food near the painful tooth until after all treatment is done. Ask your dentist about this. °GET HELP RIGHT AWAY IF:  °· The problem gets worse or new problems appear. °· You have a fever. °· There is redness and puffiness (swelling) of the face, jaw, or neck. °· You cannot open your mouth. °· There is pain in the jaw. °· There is very bad pain that is not helped by medicine. °MAKE SURE YOU:  °· Understand these instructions. °· Will watch your condition. °· Will get help right away if you are not doing well or get worse. °Document Released: 05/17/2008 Document Revised: 02/21/2012 Document Reviewed: 05/17/2008 °ExitCare® Patient Information ©2014 [employer]. ° ° °Emergency Department Resource Guide °1) Find a Doctor and Pay Out of Pocket °Although you won't have to find out who is covered by your insurance plan, it is a good idea to ask around and get recommendations. You will then need to call the office and see if the doctor you have chosen will accept you as a new patient and what types of options they offer for patients who are self-pay. Some doctors offer discounts or will set up payment plans for their patients who do not have insurance, but you will need to ask so you aren't surprised when you get to your appointment. ° °2) Contact Your Local Health Department °Not all health departments have doctors that can see patients for sick visits, but many do, so it is worth a call to see if yours does. If you don't know where your local health department is, you can check in your phone book. The CDC also has a tool to help you locate your state's health department, and many  state websites also have listings of all of their local health departments. ° °3) Find a Walk-in Clinic °If your illness is not likely to be very severe or complicated, you may want to try a walk in clinic. These are popping up all over the country in pharmacies, drugstores, and shopping centers. They're usually staffed by nurse practitioners or physician assistants that have been trained to treat common illnesses and complaints. They're usually fairly quick and inexpensive. However, if you have serious medical issues or chronic medical problems, these are probably not your best option. ° °No Primary Care Doctor: °- Call [facility] at  832-8000 - they can help you locate a primary care doctor that  accepts your insurance, provides certain services, etc. °- Physician Referral Service- 1-800-533-3463 ° °Chronic Pain Problems: °Organization         Address  Phone   Notes  °[facility]  (336) 297-2271 Patients need to be referred by their primary care doctor.  ° °Medication Assistance: °Organization         Address  Phone   Notes  °[place] [facility] [address] °, [place] [address] (336) 641-8030 --Must be a resident of [place] °-- Must have NO insurance coverage whatsoever (no Medicaid/ Medicare, etc.) °-- The pt. MUST have a primary care doctor that directs their care regularly and follows   them in the community °  °[facility]  (866) 331-1348   °[employer]  (888) 892-1162   ° °Agencies that provide inexpensive medical care: °Organization         Address  Phone   Notes  °[facility]  (336) 832-8035   °[facility]    (336) 832-7272   °[facility] [address] °[place], [place] [address] (336) 832-4777   °[facility] [address], °[place] (336) 271-4999   °[facility]    (336) 373-0678   °[facility]    (336) 272-1050   °[facility] ° [address], [place] Phone:  (336) 832-4444, Fax:  (336) 832-4440 Hours of Operation:  9 am - 6 pm, M-F.  Also accepts Medicaid/Medicare and self-pay.  ° [facility] ° [address], [place] Phone: (336) 832-3150, Fax: (336) 832-3151. Hours of Operation:  8:30 am - 5:30 pm, M-F.  Also accepts Medicaid and self-pay.  °[facility] [place] [address], [place] Phone: (336) 878-6027   °[facility] [address], [place], [place] (336)723-1848, Ext. 123 Mondays & Thursdays: 7-9 AM.  First 15 patients are seen on a first come, first serve basis. °  ° °Medicaid-accepting [place] Providers: ° °Organization         Address  Phone   Notes  °[facility] [address], [place] (336) 641-2100 Also accepts self-pay patients.  °[facility] [address], [place] ° (336) 856-9996   °[facility] [address], [place] (336) 288-8857   °[facility] [address] [place] [address], [place] (336) 299-7000   °Veita Bland [address], [place]  ° (336) 373-1557 Only accepts [place] Access Medicaid patients after they have their name applied to their card.  ° °Self-Pay (no insurance) in [place]: ° °Organization         Address  Phone   Notes  °Sickle Cell Patients, [facility] [address], [place] (336) 832-1970   °[facility] [address], [place] (336) 832-4400   °[facility] ° [address], [place] (336) 992-4800   °[facility]/Dr. Osei-Bonsu ° [address] [place] [address], [place] or [address], [place] (336) 841-8500 Phone number for both [place] and [place] locations is the same.  °[facility] [address], [place] (336) 299-0000   °[facility] [address]  [place] [address], [place] or [address] (336) 852-7530 °(336) 878-2260   °[facility] [address], [place] (336) 350-1642, phone; (336) 294-5005, fax Sees patients 1st and 3rd Saturday of every month.  Must not qualify for public or private insurance (i.e. Medicaid, Medicare,  Health Choice, Veterans' Benefits) • Household income should be no more than 200% of the poverty level •The clinic cannot treat you if you are pregnant or think you are pregnant • Sexually transmitted diseases are not treated at the clinic.  ° ° °Dental Care: °Organization         Address  Phone  Notes  °[facility] [facility] [address],  (336) 641-6152 Accepts children up to age 21 who are enrolled   in Medicaid or [place] Health Choice; pregnant women with a Medicaid card; and children who have applied for Medicaid or [place] Health Choice, but were declined, whose parents can pay a reduced fee at time of service.  °[facility] [place]  [address], [place] (336) 641-7733 Accepts children up to age 21 who are enrolled in Medicaid or [place] Health Choice; pregnant women with a Medicaid card; and children who have applied for Medicaid or [place] Health Choice, but were declined, whose parents can pay a reduced fee at time of service.  °[facility] ° [address], [place] (336) 641-4533 Patients are seen by appointment only. Walk-ins are not accepted. [facility] will see patients 18 years of age and older. °Monday - Tuesday (8am-5pm) °Most Wednesdays (8:30-5pm) °$30 per visit, cash only  °[facility] ° [address], [place] (336) 641-4533 Patients are seen by appointment only. Walk-ins are not accepted. [facility] will see patients 18 years of age and older. °One Wednesday Evening (Monthly: Volunteer Based).  $30 per visit, cash only  °[facility]  (919) 537-3737 for adults; Children under age 4, call Graduate Pediatric Dentistry at (919) [phone]. Children aged 4-14, please call (919) [phone] to request a pediatric application. ° Dental services are provided in all areas of dental care including fillings, crowns and bridges, complete and partial dentures, implants, gum treatment, root canals, and extractions. Preventive care is also provided. Treatment is provided to both adults and children. °Patients are selected via a lottery and there is often a waiting list. °  °[facility] [address], °[place] ° (336) 763-8833 [email or web address] °  °[facility] [address], [place], [place] (336)723-1848, Ext. 123 Second and Fourth Thursday of each month, opens at 6:30 AM; Clinic ends at 9 AM.  Patients are seen on a first-come first-served basis, and a limited number are seen during each clinic.  ° °[facility] ° [address], [place], [place] (336) 723-7904   Eligibility Requirements °You must have lived in [place], [place], or [place] counties for at least the last three months. °  You cannot be eligible for state or federal sponsored healthcare insurance, including Veterans Administration, Medicaid, or Medicare. °  You generally cannot be eligible for healthcare insurance through your employer.  °  How to apply: °Eligibility screenings are held every Tuesday and Wednesday afternoon from 1:00 pm until 4:00 pm. You do not need an appointment for the interview!  °[facility] [address], [place], [place] 336-631-2330   °[facility] [facility]  336-342-8273   °[facility]  336-703-3100   °[facility]  336-570-6415   ° °Behavioral Health Resources in the Community: °Intensive Outpatient Programs °Organization         Address  Phone  Notes  °[facility] [address], [place], [place]  336-878-6098   °[facility] [address], [place], [place] 336-832-9800   °[facility] [address], [place], [place] ° 336-882-2125   °[facility] [address],  °[place], [place] 1-800-853-5163 or 336-641-4981   °Substance Abuse Resources °Organization         Address  Phone  Notes  °[facility]  336-882-2125   °[facility]  336-784-9470   °  [facility]  336-285-9073   °[facility]  336-845-3988   °Residential & Outpatient Substance Abuse Program  1-800-659-3381   °Psychological Services °Organization         Address  Phone  Notes  °[facility]  336- 832-9600   °[facility]  336- 378-7881   °[facility] [address], [place] 1-800-853-5163 or 336-641-4981   ° °Mobile Crisis Teams °Organization         Address  Phone  Notes  °[facility], Mobile Crisis Care Unit  1-877-626-1772   °Assertive °[facility] ° [address] [place], [place] 336-834-9664   °Sharon DeEsch [address] °[place] 336-554-5454   ° °Self-Help/Support Groups °Organization         Address  Phone             Notes  °[facility] - variety of support groups  336- 373-1402 Call for more information  °Narcotics Anonymous (NA), [facility] [address], °[place]  2 meetings at this location  ° °Residential Treatment Programs °Organization         Address  Phone  Notes  °[facility] [address],    °[place]  1-866-801-8205   °[facility] ° [address], [place], [place] 704-293-8524   °[facility] [address], [place] 336-845-3988 Admissions: 8am-3pm M-F  °[facility] [address]    °[place], [place] 336-841-1104   °[facility] [address], [place], [place] 336-379-7146   °[facility] [address]    °[place], [place] 336-285-9073   °[facility] Intensive Outpatient [address], [place], [place] 336-852-3033   °[facility] [address]  °[place], [place] 1-877-615-2722 or 336-784-9470   °[facility] [address] [place], [place] 336-227-7417 Accepts Medicaid  °[facility] [address]  °[place] 1-800-659-3381 Substance Abuse/Addiction Treatment  ° °[place] Behavioral Health Resources °Organization         Address  Phone  Notes  °[facility]  (888) 581-9988   °Julie Brannon, PhD [address] [place], [place]   (336) 349-5553 or (336) 951-0000   °[facility]   [address] °[place], [place] (336) 349-4454   °[facility] [address], [place], [place] (336) 342-8316 Insurance/Medicaid/sponsorship through [facility]  °[facility] [address]                                    [place], [place] (336) 342-8316 Therapy/tele-psych/case  °[facility] [address]  ° [place], [place] (336) 349-2233    °Dr. Arfeen  (336) 349-4544   °[facility] [place]  [employer] [place] [facility] 1) [address], [place] °2) [address], [place] °3)  [address], [place] (336) 349-3220 °(336) 342-7768 ° °(336) 342-8140   °[place] [facility] (336) 342-1394 or (336) 342-3537 (After Hours)    ° ° ° °

## 2014-02-22 NOTE — ED Provider Notes (Signed)
CSN: [id number]     Arrival date & time 02/22/14  1149 History   First MD Initiated Contact with Patient 02/22/14 1207     Chief Complaint  Patient presents with  . Dental Pain     (Consider location/radiation/quality/duration/timing/severity/associated sxs/prior Treatment) Patient is a 38 y.o. female presenting with tooth pain. The history is provided by the patient.  Dental Pain Location:  Lower Lower teeth location:  24/LL central incisor, 25/RL central incisor, 27/RL cuspid, 26/RL lateral incisor, 22/LL cuspid and 23/LL lateral incisor Quality:  Throbbing and aching Severity:  Moderate Onset quality:  Gradual Duration:  3 days Timing:  Constant Progression:  Unchanged Chronicity:  New Context: dental caries and poor dentition   Context: not abscess and not trauma   Relieved by:  Nothing Worsened by:  Hot food/drink and cold food/drink Ineffective treatments:  NSAIDs Associated symptoms: gum swelling   Associated symptoms: no congestion, no difficulty swallowing, no facial pain, no facial swelling, no fever, no headaches, no neck pain, no neck swelling, no oral bleeding and no trismus   Risk factors: lack of dental care and periodontal disease   Risk factors: no diabetes and no smoking     Past Medical History  Diagnosis Date  . Stroke   . Asthma    Past Surgical History  Procedure Laterality Date  . Cholecystectomy    . Bladder surgery     History reviewed. No pertinent family history. History  Substance Use Topics  . Smoking status: Former Smoker    Quit date: 06/18/2013  . Smokeless tobacco: Not on file  . Alcohol Use: No   OB History   Grav Para Term Preterm Abortions TAB SAB Ect Mult Living                 Review of Systems  Constitutional: Negative for fever and appetite change.  HENT: Positive for dental problem. Negative for congestion, facial swelling, sore throat and trouble swallowing.   Eyes: Negative for pain and visual disturbance.   Musculoskeletal: Negative for neck pain and neck stiffness.  Neurological: Negative for dizziness, facial asymmetry and headaches.  Hematological: Negative for adenopathy.  All other systems reviewed and are negative.      Allergies  Review of patient's allergies indicates no known allergies.  Home Medications   Current Outpatient Rx  Name  Route  Sig  Dispense  Refill  . ibuprofen (ADVIL,MOTRIN) 200 MG tablet   Oral   Take 800 mg by mouth every 6 (six) hours as needed. Pain           BP 143/93  Pulse 74  Temp(Src) 97.8 F (36.6 C) (Oral)  Resp 18  Wt 124 lb (56.246 kg)  SpO2 100%  LMP 02/16/2014 Physical Exam  Nursing note and vitals reviewed. Constitutional: She is oriented to person, place, and time. She appears well-developed and well-nourished. No distress.  HENT:  Head: Normocephalic and atraumatic.  Right Ear: Tympanic membrane and ear canal normal.  Left Ear: Tympanic membrane and ear canal normal.  Mouth/Throat: Uvula is midline, oropharynx is clear and moist and mucous membranes are normal. No trismus in the jaw. Dental caries present. No dental abscesses or uvula swelling.  Patient is edentulous except for #21-27 teeth.  Remaining teeth are all black in color and enamel is eroding.  No facial swelling, obvious dental abscess, trismus, or sublingual abnml.    Neck: Normal range of motion. Neck supple.  Cardiovascular: Normal rate, regular rhythm and normal heart sounds.  Exam reveals no friction rub.   No murmur heard. Pulmonary/Chest: Effort normal and breath sounds normal. No respiratory distress.  Musculoskeletal: Normal range of motion.  Lymphadenopathy:    She has no cervical adenopathy.  Neurological: She is alert and oriented to person, place, and time. She exhibits normal muscle tone. Coordination normal.  Skin: Skin is warm and dry.    ED Course  Procedures (including critical care time) Labs Review Labs Reviewed - No data to display Imaging  Review No results found.   EKG Interpretation None      MDM   Final diagnoses:  None    patietn is well appearing.  No concerning sx's for dental abscess or infection to the floor of the mouth.  [facility].  Appears stable for discharge.  Agrees to amoxil and ultram  The patient appears reasonably screened and/or stabilized for discharge and I doubt any other medical condition or other [facility] requiring further screening, evaluation, or treatment in the ED at this time prior to discharge.    Angela Vazguez L. Trisha Mangleriplett, PA-C 02/22/14 1311

## 2014-02-22 NOTE — ED Notes (Signed)
Toothache times 3 days.  Called dentist yesterday and has appointment for April.

## 2014-02-23 NOTE — ED Provider Notes (Signed)
Medical screening examination/treatment/procedure(s) were performed by non-physician practitioner and as supervising physician I was immediately available for consultation/collaboration.   EKG Interpretation None        Ledarrius Beauchaine M Iran Rowe, DO 02/23/14 1250 

## 2014-02-28 ENCOUNTER — Emergency Department (HOSPITAL_COMMUNITY): Payer: Self-pay

## 2014-02-28 ENCOUNTER — Encounter (HOSPITAL_COMMUNITY): Payer: Self-pay | Admitting: Emergency Medicine

## 2014-02-28 ENCOUNTER — Emergency Department (HOSPITAL_COMMUNITY)
Admission: EM | Admit: 2014-02-28 | Discharge: 2014-02-28 | Disposition: A | Discharge status: Home / Self Care | Payer: Self-pay | Attending: Emergency Medicine | Admitting: Emergency Medicine

## 2014-02-28 DIAGNOSIS — Z87891 Personal history of nicotine dependence: Secondary | ICD-10-CM | POA: Insufficient documentation

## 2014-02-28 DIAGNOSIS — R209 Unspecified disturbances of skin sensation: Secondary | ICD-10-CM | POA: Insufficient documentation

## 2014-02-28 DIAGNOSIS — R5381 Other malaise: Secondary | ICD-10-CM | POA: Insufficient documentation

## 2014-02-28 DIAGNOSIS — M7989 Other specified soft tissue disorders: Secondary | ICD-10-CM | POA: Insufficient documentation

## 2014-02-28 DIAGNOSIS — J45909 Unspecified asthma, uncomplicated: Secondary | ICD-10-CM | POA: Insufficient documentation

## 2014-02-28 DIAGNOSIS — R51 Headache: Secondary | ICD-10-CM | POA: Insufficient documentation

## 2014-02-28 DIAGNOSIS — Z8673 Personal history of transient ischemic attack (TIA), and cerebral infarction without residual deficits: Secondary | ICD-10-CM | POA: Insufficient documentation

## 2014-02-28 DIAGNOSIS — R519 Headache, unspecified: Secondary | ICD-10-CM

## 2014-02-28 DIAGNOSIS — Z792 Long term (current) use of antibiotics: Secondary | ICD-10-CM | POA: Insufficient documentation

## 2014-02-28 DIAGNOSIS — K529 Noninfective gastroenteritis and colitis, unspecified: Secondary | ICD-10-CM

## 2014-02-28 DIAGNOSIS — R109 Unspecified abdominal pain: Secondary | ICD-10-CM | POA: Insufficient documentation

## 2014-02-28 DIAGNOSIS — M549 Dorsalgia, unspecified: Secondary | ICD-10-CM | POA: Insufficient documentation

## 2014-02-28 DIAGNOSIS — R200 Anesthesia of skin: Secondary | ICD-10-CM

## 2014-02-28 DIAGNOSIS — K5289 Other specified noninfective gastroenteritis and colitis: Secondary | ICD-10-CM | POA: Insufficient documentation

## 2014-02-28 DIAGNOSIS — R5383 Other fatigue: Secondary | ICD-10-CM

## 2014-02-28 LAB — COMPREHENSIVE METABOLIC PANEL
ALBUMIN: 4.6 g/dL (ref 3.5–5.2)
ALT: 29 U/L (ref 0–35)
AST: 25 U/L (ref 0–37)
Alkaline Phosphatase: 76 U/L (ref 39–117)
BILIRUBIN TOTAL: 0.6 mg/dL (ref 0.3–1.2)
BUN: 7 mg/dL (ref 6–23)
CALCIUM: 9.9 mg/dL (ref 8.4–10.5)
CHLORIDE: 100 meq/L (ref 96–112)
CO2: 30 meq/L (ref 19–32)
CREATININE: 0.72 mg/dL (ref 0.50–1.10)
GFR calc Af Amer: >90 mL/min (ref >90)
Glucose, Bld: 109 mg/dL — ABNORMAL HIGH (ref 70–99)
Potassium: 3.7 mEq/L (ref 3.7–5.3)
Sodium: 142 mEq/L (ref 137–147)
Total Protein: 8.2 g/dL (ref 6.0–8.3)

## 2014-02-28 LAB — CBC WITH DIFFERENTIAL/PLATELET
BASOS ABS: 0 10*3/uL (ref 0.0–0.1)
BASOS PCT: 0 % (ref 0–1)
EOS ABS: 0 10*3/uL (ref 0.0–0.7)
Eosinophils Relative: 0 % (ref 0–5)
HCT: 36.7 % (ref 36.0–46.0)
HEMOGLOBIN: 11.5 g/dL — AB (ref 12.0–15.0)
Lymphocytes Relative: 28 % (ref 12–46)
Lymphs Abs: 2.4 10*3/uL (ref 0.7–4.0)
MCH: 20 pg — AB (ref 26.0–34.0)
MCHC: 31.3 g/dL (ref 30.0–36.0)
MCV: 63.8 fL — ABNORMAL LOW (ref 78.0–100.0)
MONO ABS: 0.4 10*3/uL (ref 0.1–1.0)
Monocytes Relative: 5 % (ref 3–12)
NEUTROS PCT: 67 % (ref 43–77)
Neutro Abs: 5.9 10*3/uL (ref 1.7–7.7)
Platelets: 387 10*3/uL (ref 150–400)
RBC: 5.75 MIL/uL — ABNORMAL HIGH (ref 3.87–5.11)
RDW: 14.7 % (ref 11.5–15.5)
WBC: 8.7 10*3/uL (ref 4.0–10.5)

## 2014-02-28 LAB — LIPASE, BLOOD: LIPASE: 20 U/L (ref 11–59)

## 2014-02-28 MED ORDER — HYDROMORPHONE HCL PF 1 MG/ML IJ SOLN
1.0000 mg | Freq: Once | INTRAMUSCULAR | Status: AC
  Administered 2014-02-28: 1 mg via INTRAVENOUS
  Filled 2014-02-28: qty 1

## 2014-02-28 MED ORDER — SODIUM CHLORIDE 0.9 % IV BOLUS (SEPSIS)
1000.0000 mL | Freq: Once | INTRAVENOUS | Status: AC
  Administered 2014-02-28: 1000 mL via INTRAVENOUS

## 2014-02-28 MED ORDER — IOHEXOL 300 MG/ML  SOLN
100.0000 mL | Freq: Once | INTRAMUSCULAR | Status: AC | PRN
  Administered 2014-02-28: 100 mL via INTRAVENOUS

## 2014-02-28 MED ORDER — ONDANSETRON 4 MG PO TBDP: 4.0000 mg | ORAL_TABLET | Freq: Three times a day (TID) | ORAL | Status: DC | PRN

## 2014-02-28 MED ORDER — ONDANSETRON HCL 4 MG/2ML IJ SOLN
4.0000 mg | Freq: Once | INTRAMUSCULAR | Status: AC
  Administered 2014-02-28: 4 mg via INTRAVENOUS
  Filled 2014-02-28: qty 2

## 2014-02-28 MED ORDER — PROMETHAZINE HCL 25 MG PO TABS: 25.0000 mg | ORAL_TABLET | Freq: Four times a day (QID) | ORAL | Status: DC | PRN

## 2014-02-28 MED ORDER — SODIUM CHLORIDE 0.9 % IV SOLN
INTRAVENOUS | Status: DC
  Administered 2014-02-28: 16:00:00 via INTRAVENOUS

## 2014-02-28 MED ORDER — HYDROCODONE-ACETAMINOPHEN 5-325 MG PO TABS: 1.0000 | ORAL_TABLET | Freq: Four times a day (QID) | ORAL | Status: DC | PRN

## 2014-02-28 NOTE — ED Notes (Signed)
Patient transported to MRI 

## 2014-02-28 NOTE — ED Notes (Signed)
[facility]

## 2014-02-28 NOTE — ED Provider Notes (Addendum)
CSN: [id number]     Arrival date & time 02/28/14  1211 History   This chart was scribed for Nicole Jakes, MD by Tana Conch, ED Scribe. This patient was seen in room [facility]04/[facility]04 and the patient's care was started at 1:50 PM .     Chief Complaint  Patient presents with  . Emesis     Patient is a 38 y.o. female presenting with vomiting. The history is provided by the patient. No language interpreter was used.  Emesis Severity:  Moderate Duration:  3 days Timing:  Rare Number of daily episodes:  3 Quality:  Unable to specify Progression:  Worsening Chronicity:  Recurrent Context: not post-tussive and not self-induced   Relieved by:  Nothing Worsened by:  Nothing tried Ineffective treatments:  None tried Associated symptoms: abdominal pain, diarrhea and headaches (started this morning)   Associated symptoms: no sore throat     HPI Comments: [name] is a 38 y.o. female who presents to the Emergency Department complaining of n/v/d that began Tuesday . Pt reports that she has vomited 3 times today and had one BM this morning. No melena or hematemesis. Pt reports associated abdominal pain that began in the past 24 hours. The abdominal pain is rated 8/10 and is a sharp pain that radiates. Pt reports that nothing relieves pain. Pt is on amoxicillin, she did not take any today.   Past Medical History  Diagnosis Date  . Stroke   . Asthma    Past Surgical History  Procedure Laterality Date  . Cholecystectomy    . Bladder surgery     History reviewed. No pertinent family history. History  Substance Use Topics  . Smoking status: Former Smoker    Quit date: 06/18/2013  . Smokeless tobacco: Not on file  . Alcohol Use: No   OB History   Grav Para Term Preterm Abortions TAB SAB Ect Mult Living                 Review of Systems  Constitutional: Negative for fever and appetite change.  HENT: Negative for sore throat.   Eyes: Negative for visual disturbance.   Respiratory: Negative for cough.   Cardiovascular: Positive for chest pain and leg swelling.  Gastrointestinal: Positive for nausea, vomiting, abdominal pain and diarrhea.  Genitourinary: Negative for hematuria and decreased urine volume.  Musculoskeletal: Positive for back pain and gait problem (started last night, left foot weakness). Negative for joint swelling.  Skin: Negative for rash.  Neurological: Positive for weakness (left foot), numbness (left hand) and headaches (started this morning).  Psychiatric/Behavioral: Negative for confusion.      Allergies  Review of patient's allergies indicates no known allergies.  Home Medications   Current Outpatient Rx  Name  Route  Sig  Dispense  Refill  . amoxicillin (AMOXIL) 500 MG capsule   Oral   Take 1 capsule (500 mg total) by mouth 3 (three) times daily. For 10 days   30 capsule   0   . ibuprofen (ADVIL,MOTRIN) 200 MG tablet   Oral   Take 800 mg by mouth every 6 (six) hours as needed. Pain          . traMADol (ULTRAM) 50 MG tablet   Oral   Take 1 tablet (50 mg total) by mouth every 6 (six) hours as needed.   20 tablet   0   . HYDROcodone-acetaminophen (NORCO/VICODIN) 5-325 MG per tablet   Oral   Take 1-2 tablets by mouth  every 6 (six) hours as needed for moderate pain.   20 tablet   0   . ondansetron (ZOFRAN ODT) 4 MG disintegrating tablet   Oral   Take 1 tablet (4 mg total) by mouth every 8 (eight) hours as needed.   12 tablet   1   . EXPIRED: promethazine (PHENERGAN) 25 MG tablet   Oral   Take 1 tablet (25 mg total) by mouth every 6 (six) hours as needed for nausea.   10 tablet   0   . promethazine (PHENERGAN) 25 MG tablet   Oral   Take 1 tablet (25 mg total) by mouth every 6 (six) hours as needed for nausea or vomiting.   12 tablet   1    BP 150/86  Pulse 95  Temp(Src) 98.3 F (36.8 C) (Oral)  Resp 18  Ht 4\' 9"  (1.448 m)  Wt 124 lb (56.246 kg)  BMI 26.83 kg/m2  SpO2 100%  LMP  02/16/2014 Physical Exam  Vitals reviewed. Constitutional: She is oriented to person, place, and time.  Cardiovascular: Normal rate, regular rhythm and normal heart sounds.   Pulmonary/Chest: Effort normal and breath sounds normal.  Abdominal: Bowel sounds are normal. There is tenderness (epigastric RUQ and LUQ). There is no guarding.  Neurological: She is alert and oriented to person, place, and time. No cranial nerve deficit. She exhibits normal muscle tone. Coordination normal.    ED Course  Procedures (including critical care time)  DIAGNOSTIC STUDIES: Oxygen Saturation is 100% on RA, normal by my interpretation.    COORDINATION OF CARE:  1:55 PM-Discussed treatment plan which includes labs,EKG, CT scan of head and abdomen with pt at bedside and pt agreed to plan. Medications  0.9 %  sodium chloride infusion ( Intravenous New Bag/Given 02/28/14 1550)  sodium chloride 0.9 % bolus 1,000 mL (0 mLs Intravenous Stopped 02/28/14 1550)  ondansetron (ZOFRAN) injection 4 mg (4 mg Intravenous Given 02/28/14 1432)  HYDROmorphone (DILAUDID) injection 1 mg (1 mg Intravenous Given 02/28/14 1433)  iohexol (OMNIPAQUE) 300 MG/ML solution 100 mL (100 mLs Intravenous Contrast Given 02/28/14 1505)  HYDROmorphone (DILAUDID) injection 1 mg (1 mg Intravenous Given 02/28/14 1548)  HYDROmorphone (DILAUDID) injection 1 mg (1 mg Intravenous Given 02/28/14 1735)  ondansetron (ZOFRAN) injection 4 mg (4 mg Intravenous Given 02/28/14 1735)       Results for orders placed during the hospital encounter of 02/28/14  CBC WITH DIFFERENTIAL      Result Value Ref Range   WBC 8.7  4.0 - 10.5 K/uL   RBC 5.75 (*) 3.87 - 5.11 MIL/uL   Hemoglobin 11.5 (*) 12.0 - 15.0 g/dL   HCT 16.1  09.6 - 04.5 %   MCV 63.8 (*) 78.0 - 100.0 fL   MCH 20.0 (*) 26.0 - 34.0 pg   MCHC 31.3  30.0 - 36.0 g/dL   RDW 40.9  81.1 - 91.4 %   Platelets 387  150 - 400 K/uL   Neutrophils Relative % 67  43 - 77 %   Lymphocytes Relative 28  12 - 46 %    Monocytes Relative 5  3 - 12 %   Eosinophils Relative 0  0 - 5 %   Basophils Relative 0  0 - 1 %   Neutro Abs 5.9  1.7 - 7.7 K/uL   Lymphs Abs 2.4  0.7 - 4.0 K/uL   Monocytes Absolute 0.4  0.1 - 1.0 K/uL   Eosinophils Absolute 0.0  0.0 - 0.7 K/uL  Basophils Absolute 0.0  0.0 - 0.1 K/uL   RBC Morphology POLYCHROMASIA PRESENT     WBC Morphology ATYPICAL LYMPHOCYTES     Smear Review LARGE PLATELETS PRESENT    COMPREHENSIVE METABOLIC PANEL      Result Value Ref Range   Sodium 142  137 - 147 mEq/L   Potassium 3.7  3.7 - 5.3 mEq/L   Chloride 100  96 - 112 mEq/L   CO2 30  19 - 32 mEq/L   Glucose, Bld 109 (*) 70 - 99 mg/dL   BUN 7  6 - 23 mg/dL   Creatinine, Ser 1.61  0.50 - 1.10 mg/dL   Calcium 9.9  8.4 - 09.6 mg/dL   Total Protein 8.2  6.0 - 8.3 g/dL   Albumin 4.6  3.5 - 5.2 g/dL   AST 25  0 - 37 U/L   ALT 29  0 - 35 U/L   Alkaline Phosphatase 76  39 - 117 U/L   Total Bilirubin 0.6  0.3 - 1.2 mg/dL   GFR calc non Af Amer >90  >90 mL/min   GFR calc Af Amer >90  >90 mL/min  LIPASE, BLOOD      Result Value Ref Range   Lipase 20  11 - 59 U/L     Results for orders placed during the hospital encounter of 02/28/14  CBC WITH DIFFERENTIAL      Result Value Ref Range   WBC 8.7  4.0 - 10.5 K/uL   RBC 5.75 (*) 3.87 - 5.11 MIL/uL   Hemoglobin 11.5 (*) 12.0 - 15.0 g/dL   HCT 04.5  40.9 - 81.1 %   MCV 63.8 (*) 78.0 - 100.0 fL   MCH 20.0 (*) 26.0 - 34.0 pg   MCHC 31.3  30.0 - 36.0 g/dL   RDW 91.4  78.2 - 95.6 %   Platelets 387  150 - 400 K/uL   Neutrophils Relative % 67  43 - 77 %   Lymphocytes Relative 28  12 - 46 %   Monocytes Relative 5  3 - 12 %   Eosinophils Relative 0  0 - 5 %   Basophils Relative 0  0 - 1 %   Neutro Abs 5.9  1.7 - 7.7 K/uL   Lymphs Abs 2.4  0.7 - 4.0 K/uL   Monocytes Absolute 0.4  0.1 - 1.0 K/uL   Eosinophils Absolute 0.0  0.0 - 0.7 K/uL   Basophils Absolute 0.0  0.0 - 0.1 K/uL   RBC Morphology POLYCHROMASIA PRESENT     WBC Morphology ATYPICAL  LYMPHOCYTES     Smear Review LARGE PLATELETS PRESENT    COMPREHENSIVE METABOLIC PANEL      Result Value Ref Range   Sodium 142  137 - 147 mEq/L   Potassium 3.7  3.7 - 5.3 mEq/L   Chloride 100  96 - 112 mEq/L   CO2 30  19 - 32 mEq/L   Glucose, Bld 109 (*) 70 - 99 mg/dL   BUN 7  6 - 23 mg/dL   Creatinine, Ser 2.13  0.50 - 1.10 mg/dL   Calcium 9.9  8.4 - 08.6 mg/dL   Total Protein 8.2  6.0 - 8.3 g/dL   Albumin 4.6  3.5 - 5.2 g/dL   AST 25  0 - 37 U/L   ALT 29  0 - 35 U/L   Alkaline Phosphatase 76  39 - 117 U/L   Total Bilirubin 0.6  0.3 - 1.2 mg/dL  GFR calc non Af Amer >90  >90 mL/min   GFR calc Af Amer >90  >90 mL/min  LIPASE, BLOOD      Result Value Ref Range   Lipase 20  11 - 59 U/L   Ct Head Wo Contrast  02/28/2014   CLINICAL DATA:  Headache, ongoing attempts to withdrawal from pain 10 L  EXAM: CT HEAD WITHOUT CONTRAST  TECHNIQUE: Contiguous axial images were obtained from the base of the skull through the vertex without intravenous contrast.  COMPARISON:  CT HEAD W/O CM dated 03/06/2012  FINDINGS: The ventricles are normal in size and position. There is no intracranial hemorrhage or intracranial mass effect. There is no evidence of an evolving ischemic infarction. There are no abnormal intracranial calcifications. There is a stable a 5 mm hypodensity just posterior and to the left of the fourth ventricle on image 8.  At bone window settings the observed portions of the paranasal sinuses and mastoid air cells are clear with exception of chronic inflammatory change in the sphenoid sinuses. There is no evidence of an acute skull fracture.  IMPRESSION: 1. There is no acute intracranial abnormality. 2. There is persistent mucoperiosteal thickening within the sphenoid sinus consistent with chronic inflammation.   Electronically Signed   By: David  [place]   On: 02/28/2014 15:17   Mr Brain Wo Contrast  02/28/2014   CLINICAL DATA:  Left arm numbness. Left leg dragging. History of stroke in the  past. Emesis.  EXAM: MRI HEAD WITHOUT CONTRAST  TECHNIQUE: Multiplanar, multiecho pulse sequences of the brain and surrounding structures were obtained without intravenous contrast.  COMPARISON:  CT HEAD W/O CM dated 02/28/2014; MR HEAD W/O CM dated 12/27/2006  FINDINGS: There is no acute infarct or intracranial hemorrhage. There are approximately 10 small foci of T2 hyperintensity within the subcortical and deep cerebral white matter bilaterally, increased from prior. There is no evidence of mass, midline shaft, or extra-axial fluid collection. Ventricles and sulci are within normal limits.  Orbits are unremarkable. Left maxillary sinus mucous retention cyst is present. There is moderate circumferential right sphenoid sinus mucosal thickening. Mastoid air cells are clear. Major intracranial vascular flow voids are preserved.  IMPRESSION: Scattered foci of white matter T2 signal abnormality, increased from the prior study. These are nonspecific, with considerations including small vessel ischemia, migraines, demyelinating disease, sequelae of trauma, hypercoagulable state, vasculitis, and prior infection.   Electronically Signed   By: Sebastian Ache   On: 02/28/2014 17:08   Ct Abdomen Pelvis W Contrast  02/28/2014   CLINICAL DATA:  Vomiting for 3 days history of previous stroke, possible new CNS deficits  EXAM: CT ABDOMEN AND PELVIS WITH CONTRAST  TECHNIQUE: Multidetector CT imaging of the abdomen and pelvis was performed using the standard protocol following bolus administration of intravenous contrast.  CONTRAST:  OMNIPAQUE IOHEXOL 300 MG/ML SOLN intravenously ; the patient also received oral contrast material.  COMPARISON:  CT ABD/PELV WO CM dated 12/21/2011  FINDINGS: There is mild stable intrahepatic ductal dilation. There is no focal hepatic mass. There is marked dilation of the common bile duct which measures 12 mm which is stable. The pancreas exhibits no focal mass. The pancreatic duct is not abnormally  dilated. The spleen, adrenal glands, and kidneys are normal in appearance. The caliber of the abdominal aorta is normal. There is no periaortic or pericaval lymphadenopathy.  The stomach is distended with oral contrast. Contrast has traversed the duodenum and loops of the upper small bowel but has  not reached the ileum or colon. There is no evidence of a small bowel obstruction nor of an ileus type pattern. The unopacified loops of ileum exhibit no abnormal wall thickening. The stool and gas pattern within the colon is normal in appearance. The appendix is not discretely demonstrated. No pericecal inflammatory change is demonstrated. There is no evidence of acute diverticulitis. There is no inguinal or umbilical hernia.  The uterus and adnexal structures exhibit no definite acute abnormality. There may be a small cyst in the right adnexal region on image 65 of series 2. There are metallic clips likely from previous tubal ligation. The urinary bladder is partially distended and grossly normal. There is free fluid in the pelvis.  The lumbar vertebral bodies are preserved in height. The bony pelvis exhibits no acute abnormality.  The lung bases are clear.  IMPRESSION: 1. There is no evidence of a small or large bowel obstruction. There is no objective evidence of enteritis or colitis. 2. There is a small amount of free fluid in the pelvis. I cannot exclude a cystic left correction right adnexal process. Pelvic ultrasound would be of value. 3. There is chronic dilation of the common bile ducts and intrahepatic ducts. There is history of previous cholecystectomy. 4. There is no acute urinary tract abnormality. 5. There is no intra-abdominal or pelvic lymphadenopathy.   Electronically Signed   By: David  [place]   On: 02/28/2014 15:26        EKG Interpretation   Date/Time:  Thursday February 28 2014 17:45:06 EDT Ventricular Rate:  70 PR Interval:  156 QRS Duration: 82 QT Interval:  450 QTC Calculation: 486 R  Axis:   59 Text Interpretation:  Normal sinus rhythm Prolonged QT Abnormal ECG When  compared with ECG of 26-Dec-2006 13:50, Nonspecific T wave abnormality now  evident in Lateral leads Confirmed by Karron Alvizo  MD, Nuri Larmer ([id number]) on  02/28/2014 5:53:09 PM      MDM   Final diagnoses:  Gastroenteritis  Headache  Numbness    I personally performed the services described in this documentation, which was scribed in my presence. The recorded information has been reviewed and is accurate.   Patient's abdominal pain and the vomiting and diarrhea most likely a gastroenteritis most likely viral. Patient's headache and left arm numbness in the left foot weakness negative for evidence of stroke patient had MRI and head CT. Will treat with antinausea medicine and pain medication. Referral to neurology for the neuro symptoms. Again no evidence of acute stroke. Patient without a significant leukocytosis no sniffing or electrolyte abnormalities no evidence of pancreatitis no significant anemia.    Nicole JakesScott W. Gwendloyn Forsee, MD 02/28/14 1753  Nicole JakesScott W. Hartwell Vandiver, MD 02/28/14 1754

## 2014-02-28 NOTE — ED Notes (Addendum)
Vomiting for 3 days, diarrhea.  Pt is taking amoxicillin for dental infection.  Alert,    Lt arm has been numb since yesterday.   Pt says her "lt leg is dragging " and feels  Her speech is different than normal.    Pt has hx of stroke affecting lt side.  2010.  Abd pain.

## 2014-02-28 NOTE — Care Management Note (Signed)
ED/CM noted patient did not have health insurance and/or PCP listed in the computer.  Patient was given the [place] resource handout with information on the clinics, food pantries, and the handout for new health insurance sign-up.  Patient expressed appreciation for information received. 

## 2014-02-28 NOTE — Discharge Instructions (Signed)
Head CT and MRI here today was negative. Abdominal symptoms most likely related to a gastroenteritis. CT scan of the abdomen without any significant findings. If symptoms persist would consider ultrasound of pelvis. Return if not improved in the next 2-3 days. Return earlier if worse. For the numbness in the left arm in the weakness in the left foot which does not appear to be consistent with a stroke based on MRI. Would recommend followup in neurology. Referral information provided. Close also resource guide to help you find a record Dr. Zachery Conch pain medicine and antinausea medicine as directed.   Emergency Department Resource Guide 1) Find a Doctor and Pay Out of Pocket Although you won't have to find out who is covered by your insurance plan, it is a good idea to ask around and get recommendations. You will then need to call the office and see if the doctor you have chosen will accept you as a new patient and what types of options they offer for patients who are self-pay. Some doctors offer discounts or will set up payment plans for their patients who do not have insurance, but you will need to ask so you aren't surprised when you get to your appointment.  2) Contact Your Local Health Department Not all health departments have doctors that can see patients for sick visits, but many do, so it is worth a call to see if yours does. If you don't know where your local health department is, you can check in your phone book. The CDC also has a tool to help you locate your state's health department, and many state websites also have listings of all of their local health departments.  3) Find a Walk-in Clinic If your illness is not likely to be very severe or complicated, you may want to try a walk in clinic. These are popping up all over the country in pharmacies, drugstores, and shopping centers. They're usually staffed by nurse practitioners or physician assistants that have been trained to treat common  illnesses and complaints. They're usually fairly quick and inexpensive. However, if you have serious medical issues or chronic medical problems, these are probably not your best option.  No Primary Care Doctor: - Call Health Connect at  [phone] - they can help you locate a primary care doctor that  accepts your insurance, provides certain services, etc. - Physician Referral Service- [phone]  Chronic Pain Problems: Organization         Address  Phone   Notes  [facility]  [phone] Patients need to be referred by their primary care doctor.   Medication Assistance: Organization         Address  Phone   Notes  [facility] Med[facility] [address] [place] [place], [place] [address] [phone] --Must be a resident of [facility] -- Must have NO insurance coverage whatsoever (no Medicaid/ Medicare, etc.) -- The pt. MUST have a primary care doctor that directs their care regularly and follows them in the community   [facility]ist  [phone]   [employer]  [phone]    Agencies that provide inexpensive medical care: Organization         Address  Phone   Notes  [facility]  [phone]   [facility]    [phone]   [facility] [address] [place], [place] [address] [phone]   [facility] [address]  [address] [place] [phone]   Planned Parenthood    [phone]   [facility]    [phone]   Community [facility] and [facility]  [address], [place] Phone:  [phone], Fax:  [phone] Hours of Operation:  9 am - 6 pm, M-F.  Also accepts Medicaid/Medicare and self-pay.  [facility]  [address], [place] Phone: [phone], Fax: [phone]. Hours of Operation:  8:30 am - 5:30 pm, M-F.  Also accepts Medicaid and self-pay.  [facility] [place]  [address], [place] Phone: [phone]   [facility] [address] Natasha Bence [place], [place] [phone], Ext. [phone] Mondays & Thursdays: 7-9 AM.  First 15 patients are seen on a first come, first serve basis.    Medicaid-accepting [facility] Providers:  Organization         Address  Phone   Notes  [facility] [address], [place] [phone] Also accepts self-pay patients.  [facility] [address] Laurell Josephs [place], [place]  [phone]   [facility] [address], [place] [phone]   [facility] [address], [place] [phone]   Renaye Rakers [address], [place]   [phone] Only accepts [place] patients after they have their name applied to their card.   Self-Pay (no insurance) in [facility]:  Organization         Address  Phone   Notes  Sickle Cell Patients, [facility] [address] [place], [place] [phone]   [facility] [address] [place], [place] [phone]   [facility] [place]  [address], [place] [phone]   [facility]/Dr. Osei-Bonsu  [address], [place] or [address], [place] [phone] Phone number for both [place] and [place] locations is the same.  [facility] [address] [place] [phone]   [facility] [address] [place] or [address] [phone]   [facility] [address], [place] [phone], phone; [phone], fax Sees patients 1st and 3rd Saturday of every month.  Must not qualify for public or private insurance (i.e. Medicaid, Medicare, [place] Health Choice, Veterans' Benefits)  Household income should be no more than 200% of the  poverty level The clinic cannot treat you if you are pregnant or think you are pregnant  Sexually transmitted diseases are not treated at the clinic.    Dental Care: Organization         Address  Phone  Notes  [facility] of [facility] [address] [place], [place] [phone] Accepts children up to age 51 who are enrolled in [place] or [place] Health Choice; pregnant women with a Medicaid card; and children who have applied for Medicaid or [place] Health Choice, but were declined, whose parents can pay a reduced fee at time of service.  [facility] Department of [facility]  [address], [place] [phone] Accepts children up to age 10 who are enrolled in [place] or [place] Health Choice; pregnant women with a Medicaid card; and children who have applied for Medicaid  or [place] Health Choice, but were declined, whose parents can pay a reduced fee at time of service.  [facility]  [address] [place], [place] [phone] Patients are seen by appointment only. Walk-ins are not accepted. [facility] will see patients 78 years of age and older. Monday - Tuesday (8am-5pm) Most Wednesdays (8:30-5pm) $30 per visit, cash only  [facility]  [address], [facility] [phone] Patients are seen by appointment only. Walk-ins are not accepted. [facility] will see patients 50 years of age and older. One Wednesday Evening (Monthly: Volunteer Based).  $30 per visit, cash only  [employer]  [phone] for adults; Children under age 68, call Graduate Pediatric Dentistry at [phone]. Children aged 52-14, please call [phone] to request a pediatric application.  Dental services are provided in all areas of dental care including fillings, crowns and bridges, complete and partial dentures, implants, gum treatment, root canals, and extractions.  Preventive care is also provided. Treatment is provided to both adults and children. Patients are selected via a lottery and there is often a waiting list.   [facility] [address], [place]  [phone] [email or web address]   [facility] [address] [place], [place] [phone], Ext. [phone] Second and Fourth Thursday of each month, opens at 6:30 AM; Clinic ends at 9 AM.  Patients are seen on a first-come first-served basis, and a limited number are seen during each clinic.   [facility]  [address] Ether Griffins [place], [place] [phone]   Eligibility Requirements You must have lived in [place], [place], or [place] counties for at least the last three months.   You cannot be eligible for state or federal sponsored [place], including [employer], [place], or [employer].   You generally cannot be eligible for healthcare insurance through your employer.    How to apply: Eligibility screenings are held every Tuesday and Wednesday afternoon from 1:00 pm until 4:00 pm. You do not need an appointment for the interview!  [facility] [address], [place], [place] [phone]   [facility] Health Department  [phone]   [facility] Health Department  [phone]   [facility] Health Department  [phone]    Behavioral Health Resources in the Community: Intensive Outpatient Programs Organization         Address  Phone  Notes  [facility] [address], [place], [place] [phone]   [facility] [address] [place], [place] [phone]   [facility] [address] [place], [place]  [phone]   [facility] [address]  [place], [place] [phone] or [phone]   Substance Abuse Resources Organization         Address  Phone  Notes  [facility] Drug Services  [phone]   Addiction  [facility]  [phone]   [facility]  [phone]   Floydene Flock  [phone]   Residential & Outpatient Substance Abuse Program  [phone]   Psychological Services Organization         Address  Phone  Notes  [facility]  [phone]   [facility]  [phone]   [facility] [address], [place] [phone] or [phone]    Mobile Crisis Teams Organization         Address  Phone  Notes  [facility], Mobile Crisis Care Unit  [phone]   [facility]  [address] [place], [place] [phone]   [facility] [address] [place] [phone]    Self-Help/Support Groups Organization         Address  Phone             Notes  [facility] [place] - variety of support groups  [phone] Call for more information  Narcotics Anonymous (NA), [facility] [address], [place]  2 meetings at this location   Statisticianesidential Treatment Programs Organization         Address  Phone  Notes  [facility] [address],    [place]  [phone]   [facility]  [address], [place] [phone], [place], [place] [phone]   [facility] [address], [place] [phone] Admissions: 8am-3pm M-F  [facility] [address]    [place], [place] [phone]   [facility] [address], [place], [place] [phone]   [facility] [address]  [place], [place] [phone]   [facility] - Intensive Outpatient [address], [place], [place] [phone]   [facility] ([facility] [address]  [place], [place] [phone] or [phone]   [facility] ([facility]) [address] [place], [place] [phone] Accepts Medicaid  [facility] [address]  [place]  [phone] Substance Abuse/Addiction Treatment   [facility]al Health Resources Organization         Address  Phone  Notes  [facility]  [phone]   Angie FavaJulie Brannon, PhD [address] [place], [place]   [phone] or [phone]   [facility]   [address] [place], [place] [phone]) [phone]   [facility] [address], [place], [place] [phone]) [phone] Insurance/Medicaid/sponsorship through [facility]  [facility] [address]                                    [place], [place] [phone](336) [phone] Therapy/tele-psych/case  [facility] [address]   [place], [place] [phone](336) [phone]    Dr. Lolly MustacheArfeen  [phone]   [facility]  [employer] [facility] 1) [address], [place] 2) [address], [place] 3)  [address], [place] [phone](336) [phone](336) [phone]  [phone](336) [phone]   [facility] Child Abuse Hotline [phone]) [phone] or [phone]) [phone] (After Hours)

## 2014-02-28 NOTE — ED Notes (Signed)
Pt remains in MRI 

## 2014-04-07 ENCOUNTER — Inpatient Hospital Stay (HOSPITAL_COMMUNITY)
Admission: EM | Admit: 2014-04-07 | Discharge: 2014-04-14 | DRG: 640 | Disposition: A | Discharge status: Home / Self Care | Payer: Self-pay | Attending: Family Medicine | Admitting: Family Medicine

## 2014-04-07 ENCOUNTER — Encounter (HOSPITAL_COMMUNITY): Payer: Self-pay | Admitting: Emergency Medicine

## 2014-04-07 ENCOUNTER — Emergency Department (HOSPITAL_COMMUNITY): Payer: Self-pay

## 2014-04-07 DIAGNOSIS — Z8249 Family history of ischemic heart disease and other diseases of the circulatory system: Secondary | ICD-10-CM

## 2014-04-07 DIAGNOSIS — Z833 Family history of diabetes mellitus: Secondary | ICD-10-CM

## 2014-04-07 DIAGNOSIS — Z8673 Personal history of transient ischemic attack (TIA), and cerebral infarction without residual deficits: Secondary | ICD-10-CM

## 2014-04-07 DIAGNOSIS — D539 Nutritional anemia, unspecified: Secondary | ICD-10-CM

## 2014-04-07 DIAGNOSIS — G8929 Other chronic pain: Secondary | ICD-10-CM | POA: Diagnosis present

## 2014-04-07 DIAGNOSIS — K859 Acute pancreatitis without necrosis or infection, unspecified: Secondary | ICD-10-CM

## 2014-04-07 DIAGNOSIS — R112 Nausea with vomiting, unspecified: Secondary | ICD-10-CM

## 2014-04-07 DIAGNOSIS — Z800 Family history of malignant neoplasm of digestive organs: Secondary | ICD-10-CM

## 2014-04-07 DIAGNOSIS — K219 Gastro-esophageal reflux disease without esophagitis: Secondary | ICD-10-CM | POA: Diagnosis present

## 2014-04-07 DIAGNOSIS — Z23 Encounter for immunization: Secondary | ICD-10-CM

## 2014-04-07 DIAGNOSIS — K2960 Other gastritis without bleeding: Secondary | ICD-10-CM | POA: Diagnosis present

## 2014-04-07 DIAGNOSIS — R197 Diarrhea, unspecified: Secondary | ICD-10-CM

## 2014-04-07 DIAGNOSIS — Z9089 Acquired absence of other organs: Secondary | ICD-10-CM

## 2014-04-07 DIAGNOSIS — Z823 Family history of stroke: Secondary | ICD-10-CM

## 2014-04-07 DIAGNOSIS — J45909 Unspecified asthma, uncomplicated: Secondary | ICD-10-CM

## 2014-04-07 DIAGNOSIS — E876 Hypokalemia: Secondary | ICD-10-CM

## 2014-04-07 DIAGNOSIS — D509 Iron deficiency anemia, unspecified: Secondary | ICD-10-CM | POA: Diagnosis present

## 2014-04-07 DIAGNOSIS — Z87891 Personal history of nicotine dependence: Secondary | ICD-10-CM

## 2014-04-07 DIAGNOSIS — J45901 Unspecified asthma with (acute) exacerbation: Secondary | ICD-10-CM | POA: Diagnosis present

## 2014-04-07 DIAGNOSIS — J9600 Acute respiratory failure, unspecified whether with hypoxia or hypercapnia: Secondary | ICD-10-CM | POA: Diagnosis present

## 2014-04-07 DIAGNOSIS — R109 Unspecified abdominal pain: Secondary | ICD-10-CM

## 2014-04-07 DIAGNOSIS — E860 Dehydration: Principal | ICD-10-CM | POA: Diagnosis present

## 2014-04-07 LAB — URINALYSIS, ROUTINE W REFLEX MICROSCOPIC
BILIRUBIN URINE: NEGATIVE
Glucose, UA: NEGATIVE mg/dL
Ketones, ur: NEGATIVE mg/dL
Leukocytes, UA: NEGATIVE
Nitrite: NEGATIVE
Protein, ur: NEGATIVE mg/dL
Urobilinogen, UA: 0.2 mg/dL (ref 0.0–1.0)
pH: 6 (ref 5.0–8.0)

## 2014-04-07 LAB — URINE MICROSCOPIC-ADD ON

## 2014-04-07 LAB — CBC WITH DIFFERENTIAL/PLATELET
Basophils Absolute: 0.1 10*3/uL (ref 0.0–0.1)
Basophils Relative: 1 % (ref 0–1)
Eosinophils Absolute: 1 10*3/uL — ABNORMAL HIGH (ref 0.0–0.7)
Eosinophils Relative: 11 % — ABNORMAL HIGH (ref 0–5)
HCT: 32.7 % — ABNORMAL LOW (ref 36.0–46.0)
Hemoglobin: 10.6 g/dL — ABNORMAL LOW (ref 12.0–15.0)
LYMPHS ABS: 3.4 10*3/uL (ref 0.7–4.0)
LYMPHS PCT: 38 % (ref 12–46)
MCH: 20.7 pg — ABNORMAL LOW (ref 26.0–34.0)
MCHC: 32.4 g/dL (ref 30.0–36.0)
MCV: 63.9 fL — ABNORMAL LOW (ref 78.0–100.0)
Monocytes Absolute: 0.7 10*3/uL (ref 0.1–1.0)
Monocytes Relative: 8 % (ref 3–12)
NEUTROS ABS: 3.9 10*3/uL (ref 1.7–7.7)
NEUTROS PCT: 43 % (ref 43–77)
PLATELETS: 359 10*3/uL (ref 150–400)
RBC: 5.12 MIL/uL — AB (ref 3.87–5.11)
RDW: 15.5 % (ref 11.5–15.5)
WBC: 9 10*3/uL (ref 4.0–10.5)

## 2014-04-07 LAB — COMPREHENSIVE METABOLIC PANEL
ALK PHOS: 69 U/L (ref 39–117)
ALT: 24 U/L (ref 0–35)
AST: 39 U/L — ABNORMAL HIGH (ref 0–37)
Albumin: 4.1 g/dL (ref 3.5–5.2)
BILIRUBIN TOTAL: 0.5 mg/dL (ref 0.3–1.2)
BUN: 10 mg/dL (ref 6–23)
CHLORIDE: 99 meq/L (ref 96–112)
CO2: 27 meq/L (ref 19–32)
Calcium: 9.1 mg/dL (ref 8.4–10.5)
Creatinine, Ser: 0.98 mg/dL (ref 0.50–1.10)
GFR calc non Af Amer: 72 mL/min — ABNORMAL LOW (ref >90)
GFR, EST AFRICAN AMERICAN: 84 mL/min — AB (ref >90)
GLUCOSE: 84 mg/dL (ref 70–99)
POTASSIUM: 2.9 meq/L — AB (ref 3.7–5.3)
SODIUM: 139 meq/L (ref 137–147)
Total Protein: 7.2 g/dL (ref 6.0–8.3)

## 2014-04-07 LAB — PREGNANCY, URINE: Preg Test, Ur: NEGATIVE

## 2014-04-07 LAB — TRIGLYCERIDES: Triglycerides: 141 mg/dL (ref <150)

## 2014-04-07 LAB — MAGNESIUM: MAGNESIUM: 2 mg/dL (ref 1.5–2.5)

## 2014-04-07 LAB — LIPASE, BLOOD: Lipase: 123 U/L — ABNORMAL HIGH (ref 11–59)

## 2014-04-07 MED ORDER — ALBUTEROL SULFATE (2.5 MG/3ML) 0.083% IN NEBU
2.5000 mg | INHALATION_SOLUTION | Freq: Four times a day (QID) | RESPIRATORY_TRACT | Status: DC
  Administered 2014-04-08 – 2014-04-09 (×5): 2.5 mg via RESPIRATORY_TRACT
  Filled 2014-04-07 (×4): qty 3

## 2014-04-07 MED ORDER — ACETAMINOPHEN 650 MG RE SUPP: 650.0000 mg | Freq: Four times a day (QID) | RECTAL | Status: DC | PRN

## 2014-04-07 MED ORDER — PANTOPRAZOLE SODIUM 40 MG IV SOLR
40.0000 mg | Freq: Once | INTRAVENOUS | Status: AC
Start: 2014-04-07 — End: 2014-04-07
  Administered 2014-04-07: 40 mg via INTRAVENOUS
  Filled 2014-04-07: qty 40

## 2014-04-07 MED ORDER — ALBUTEROL SULFATE (2.5 MG/3ML) 0.083% IN NEBU
2.5000 mg | INHALATION_SOLUTION | Freq: Once | RESPIRATORY_TRACT | Status: AC
  Administered 2014-04-07: 2.5 mg via RESPIRATORY_TRACT
  Filled 2014-04-07: qty 3

## 2014-04-07 MED ORDER — ACETAMINOPHEN 325 MG PO TABS: 650.0000 mg | ORAL_TABLET | Freq: Four times a day (QID) | ORAL | Status: DC | PRN

## 2014-04-07 MED ORDER — ASPIRIN 81 MG PO CHEW
81.0000 mg | CHEWABLE_TABLET | Freq: Every day | ORAL | Status: DC
  Administered 2014-04-08 – 2014-04-14 (×7): 81 mg via ORAL
  Filled 2014-04-07 (×7): qty 1

## 2014-04-07 MED ORDER — IOHEXOL 300 MG/ML  SOLN
100.0000 mL | Freq: Once | INTRAMUSCULAR | Status: AC | PRN
  Administered 2014-04-07: 100 mL via INTRAVENOUS

## 2014-04-07 MED ORDER — ONDANSETRON HCL 4 MG/2ML IJ SOLN
4.0000 mg | Freq: Four times a day (QID) | INTRAMUSCULAR | Status: DC | PRN
  Administered 2014-04-07 – 2014-04-12 (×7): 4 mg via INTRAVENOUS
  Filled 2014-04-07 (×6): qty 2

## 2014-04-07 MED ORDER — POTASSIUM CHLORIDE IN NACL 20-0.9 MEQ/L-% IV SOLN
INTRAVENOUS | Status: DC
  Administered 2014-04-07: 23:00:00 via INTRAVENOUS
  Administered 2014-04-08: 75 mL/h via INTRAVENOUS
  Administered 2014-04-09 – 2014-04-11 (×5): via INTRAVENOUS

## 2014-04-07 MED ORDER — ONDANSETRON HCL 4 MG PO TABS
4.0000 mg | ORAL_TABLET | Freq: Four times a day (QID) | ORAL | Status: DC | PRN
  Administered 2014-04-13 – 2014-04-14 (×4): 4 mg via ORAL
  Filled 2014-04-07 (×4): qty 1

## 2014-04-07 MED ORDER — GUAIFENESIN-DM 100-10 MG/5ML PO SYRP: 5.0000 mL | ORAL_SOLUTION | ORAL | Status: DC | PRN

## 2014-04-07 MED ORDER — PANTOPRAZOLE SODIUM 40 MG IV SOLR
40.0000 mg | Freq: Two times a day (BID) | INTRAVENOUS | Status: DC
  Administered 2014-04-08 – 2014-04-10 (×5): 40 mg via INTRAVENOUS
  Filled 2014-04-07 (×5): qty 40

## 2014-04-07 MED ORDER — ZOLPIDEM TARTRATE 5 MG PO TABS
5.0000 mg | ORAL_TABLET | Freq: Every evening | ORAL | Status: DC | PRN
  Administered 2014-04-07 – 2014-04-13 (×4): 5 mg via ORAL
  Filled 2014-04-07 (×4): qty 1

## 2014-04-07 MED ORDER — HYDROMORPHONE HCL PF 1 MG/ML IJ SOLN
1.0000 mg | Freq: Once | INTRAMUSCULAR | Status: AC
  Administered 2014-04-07: 1 mg via INTRAVENOUS
  Filled 2014-04-07: qty 1

## 2014-04-07 MED ORDER — ENOXAPARIN SODIUM 40 MG/0.4ML ~~LOC~~ SOLN
40.0000 mg | SUBCUTANEOUS | Status: DC
  Administered 2014-04-08 – 2014-04-12 (×5): 40 mg via SUBCUTANEOUS
  Filled 2014-04-07 (×6): qty 0.4

## 2014-04-07 MED ORDER — POTASSIUM CHLORIDE 10 MEQ/100ML IV SOLN
10.0000 meq | Freq: Once | INTRAVENOUS | Status: AC
  Administered 2014-04-07: 10 meq via INTRAVENOUS
  Filled 2014-04-07: qty 100

## 2014-04-07 MED ORDER — POTASSIUM CHLORIDE 10 MEQ/100ML IV SOLN
10.0000 meq | INTRAVENOUS | Status: AC
  Administered 2014-04-07 – 2014-04-08 (×4): 10 meq via INTRAVENOUS
  Filled 2014-04-07 (×4): qty 100

## 2014-04-07 MED ORDER — ALBUTEROL SULFATE (2.5 MG/3ML) 0.083% IN NEBU: 2.5000 mg | INHALATION_SOLUTION | RESPIRATORY_TRACT | Status: DC | PRN

## 2014-04-07 MED ORDER — ONDANSETRON HCL 4 MG/2ML IJ SOLN
4.0000 mg | Freq: Once | INTRAMUSCULAR | Status: AC
  Administered 2014-04-07: 4 mg via INTRAVENOUS
  Filled 2014-04-07: qty 2

## 2014-04-07 MED ORDER — OXYCODONE HCL 5 MG PO TABS
5.0000 mg | ORAL_TABLET | ORAL | Status: DC | PRN
  Administered 2014-04-09 – 2014-04-14 (×11): 5 mg via ORAL
  Filled 2014-04-07 (×11): qty 1

## 2014-04-07 MED ORDER — HYDROMORPHONE HCL PF 1 MG/ML IJ SOLN
0.5000 mg | INTRAMUSCULAR | Status: DC | PRN
  Administered 2014-04-07 – 2014-04-12 (×31): 0.5 mg via INTRAVENOUS
  Filled 2014-04-07 (×32): qty 1

## 2014-04-07 MED ORDER — IOHEXOL 300 MG/ML  SOLN
50.0000 mL | Freq: Once | INTRAMUSCULAR | Status: AC | PRN
  Administered 2014-04-07: 50 mL via ORAL

## 2014-04-07 NOTE — ED Provider Notes (Signed)
CSN: [id number]     Arrival date & time 04/07/14  1731 History  This chart was scribed for Nicole LennertJoseph L Adra Shepler, MD by Evon Slackerrance Branch, ED Scribe. This patient was seen in room APA08/APA08 and the patient's care was started at 5:43 PM.      Chief Complaint  Patient presents with  . Abdominal Pain   Patient is a 38 y.o. female presenting with abdominal pain. The history is provided by the patient. No language interpreter was used.  Abdominal Pain Pain location:  Epigastric Pain radiates to:  Does not radiate Pain severity:  Moderate Onset quality:  Gradual Duration:  3 days Timing:  Constant Progression:  Unchanged Chronicity:  New Relieved by:  None tried Worsened by:  Nothing tried Ineffective treatments:  None tried Associated symptoms: diarrhea and vomiting   Associated symptoms: no chest pain, no cough, no fatigue, no hematemesis and no hematuria    HPI Comments: [name] is a 38 y.o. female with h/o cholecystectomy who presents to the Emergency Department complaining of constant  abdominal pain onset 3 days ago. Pt states associated symptoms of 3 episodes of emesis and 2 episodes of diarrhea today. Pt states she has a h/o of similar abdominal pain. She denies any episodes of hematemesis.   Past Medical History  Diagnosis Date  . Stroke   . Asthma    Past Surgical History  Procedure Laterality Date  . Cholecystectomy    . Bladder surgery     History reviewed. No pertinent family history. History  Substance Use Topics  . Smoking status: Former Smoker    Quit date: 06/18/2013  . Smokeless tobacco: Not on file  . Alcohol Use: No   OB History   Grav Para Term Preterm Abortions TAB SAB Ect Mult Living                 Review of Systems  Constitutional: Negative for appetite change and fatigue.  HENT: Negative for congestion, ear discharge and sinus pressure.   Eyes: Negative for discharge.  Respiratory: Negative for cough.   Cardiovascular: Negative for chest  pain.  Gastrointestinal: Positive for vomiting, abdominal pain and diarrhea. Negative for hematemesis.  Genitourinary: Negative for frequency and hematuria.  Musculoskeletal: Negative for back pain.  Skin: Negative for rash.  Neurological: Negative for seizures and headaches.  Psychiatric/Behavioral: Negative for hallucinations.      Allergies  Review of patient's allergies indicates no known allergies.  Home Medications   Prior to Admission medications   Medication Sig Start Date End Date Taking? Authorizing Provider  amoxicillin (AMOXIL) 500 MG capsule Take 1 capsule (500 mg total) by mouth 3 (three) times daily. For 10 days 02/22/14   Tammy L. Triplett, PA-C  HYDROcodone-acetaminophen (NORCO/VICODIN) 5-325 MG per tablet Take 1-2 tablets by mouth every 6 (six) hours as needed for moderate pain. 02/28/14   Shelda JakesScott W. Zackowski, MD  ibuprofen (ADVIL,MOTRIN) 200 MG tablet Take 800 mg by mouth every 6 (six) hours as needed. Pain     Historical Provider, MD  ondansetron (ZOFRAN ODT) 4 MG disintegrating tablet Take 1 tablet (4 mg total) by mouth every 8 (eight) hours as needed. 02/28/14   Shelda JakesScott W. Zackowski, MD  promethazine (PHENERGAN) 25 MG tablet Take 1 tablet (25 mg total) by mouth every 6 (six) hours as needed for nausea. 12/21/11 12/28/11  Cheri GuppyJeffrey Caporossi, MD  promethazine (PHENERGAN) 25 MG tablet Take 1 tablet (25 mg total) by mouth every 6 (six) hours as needed for nausea  or vomiting. 02/28/14   Shelda JakesScott W. Zackowski, MD  traMADol (ULTRAM) 50 MG tablet Take 1 tablet (50 mg total) by mouth every 6 (six) hours as needed. 02/22/14   Tammy L. Triplett, PA-C   Triage Vitals: BP 144/85  Pulse 89  Temp(Src) 97.7 F (36.5 C) (Oral)  Resp 16  Ht 4\' 9"  (1.448 m)  Wt 124 lb (56.246 kg)  BMI 26.83 kg/m2  SpO2 100%  LMP 03/31/2014  Physical Exam  Nursing note and vitals reviewed. Constitutional: She is oriented to person, place, and time. She appears well-developed.  HENT:  Head: Normocephalic.   Eyes: Conjunctivae and EOM are normal. No scleral icterus.  Neck: Neck supple. No thyromegaly present.  Cardiovascular: Normal rate and regular rhythm.  Exam reveals no gallop and no friction rub.   No murmur heard. Pulmonary/Chest: No stridor. She has no wheezes. She has no rales. She exhibits no tenderness.  Abdominal: She exhibits no distension. There is tenderness. There is no rebound.  Moderate epigastric tenderness  Musculoskeletal: Normal range of motion. She exhibits no edema.  Lymphadenopathy:    She has no cervical adenopathy.  Neurological: She is oriented to person, place, and time. She exhibits normal muscle tone. Coordination normal.  Skin: No rash noted. No erythema.  Psychiatric: She has a normal mood and affect. Her behavior is normal.    ED Course  Procedures (including critical care time) DIAGNOSTIC STUDIES: Oxygen Saturation is 100% on RA, normal by my interpretation.    COORDINATION OF CARE: 5:50 PM-Discussed treatment plan which includes medications, X-ray, CBC panel, and CMP with pt at bedside and pt agreed to plan.     Labs Review Labs Reviewed  CBC WITH DIFFERENTIAL - Abnormal; Notable for the following:    RBC 5.12 (*)    Hemoglobin 10.6 (*)    HCT 32.7 (*)    MCV 63.9 (*)    MCH 20.7 (*)    Eosinophils Relative 11 (*)    Eosinophils Absolute 1.0 (*)    All other components within normal limits  COMPREHENSIVE METABOLIC PANEL - Abnormal; Notable for the following:    Potassium 2.9 (*)    AST 39 (*)    GFR calc non Af Amer 72 (*)    GFR calc Af Amer 84 (*)    All other components within normal limits  LIPASE, BLOOD - Abnormal; Notable for the following:    Lipase 123 (*)    All other components within normal limits  URINALYSIS, ROUTINE W REFLEX MICROSCOPIC - Abnormal; Notable for the following:    APPearance CLOUDY (*)    Specific Gravity, Urine >1.030 (*)    Hgb urine dipstick MODERATE (*)    All other components within normal limits  URINE  MICROSCOPIC-ADD ON - Abnormal; Notable for the following:    Squamous Epithelial / LPF MANY (*)    Bacteria, UA MANY (*)    All other components within normal limits  PREGNANCY, URINE    Imaging Review Dg Abd Acute W/chest  04/07/2014   CLINICAL DATA:  Abdominal pain with nausea vomiting and diarrhea for the preceding 5 days  EXAM: ACUTE ABDOMEN SERIES (ABDOMEN 2 VIEW & CHEST 1 VIEW)  COMPARISON:  DG CHEST 1V PORT dated 03/04/2014; CT ABD - PELV W/ CM dated 02/28/2014  FINDINGS: The lungs are well-expanded and clear. The cardiopericardial silhouette is normal in size. The mediastinum is normal in width. There is no pleural effusion. The observed portions of the bony thorax appear normal.  Within the abdomen there is a moderate stool burden within the colon. There is no evidence of a small or large bowel obstruction or ileus. There are surgical clips in the gallbladder fossa. There are calcifications within the pelvis likely reflecting phleboliths. No abnormal calcifications project over either kidney. The lumbar spine and bony pelvis appear normal where visualized.  IMPRESSION: 1. The bowel gas pattern may reflect clinical constipation. There is no evidence of ileus, obstruction, or perforation of the bowel. 2. There is no evidence of active cardiopulmonary disease.   Electronically Signed   By: David  [place]   On: 04/07/2014 18:48     EKG Interpretation None      MDM   Final diagnoses:  None   Admit for abd pain,  Hypokalemia The chart was scribed for me under my direct supervision.  I personally performed the history, physical, and medical decision making and all procedures in the evaluation of this patient.Nicole Lennert, MD 04/07/14 2114

## 2014-04-07 NOTE — ED Notes (Signed)
CRITICAL VALUE ALERT  Critical value received:  Potassium 2.9  Date of notification:  04/07/14  Time of notification:  1852  Critical value read back:yes  Nurse who received alert:  c Kent Riendeau rn  MD notified (1st page):    Time of first page:    MD notified (2nd page):  Time of second page:  Responding MD:  Dr Estell HarpinZammit  Time MD responded:  [phone]

## 2014-04-07 NOTE — ED Notes (Addendum)
Pt reports bilateral upper quadrant abdominal pain x3 days. Pt reports pain began after eating. Pt reports several episodes of v/d in the last 24 hours. Pt reports has not been able to eat/drink since pain began. Pt denies any blood in stool or urine.

## 2014-04-07 NOTE — H&P (Signed)
[facility] History and Physical  [name]:[id number] DOB: [birth year] DOA: 04/07/2014   PCP: She is followed by the health department Specialists: None  Chief Complaint: Upper abdominal pain for 3 days  HPI: [name] is a 38 y.o. 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 There was no radiation. The pain was described as a sharp pain, 8/10 in intensity at its worst. Associated with nausea and multiple episodes of vomiting. She's also had diarrhea for the last 2 days. It has been watery without any blood. The emesis has also been nonbloody. Denies any fever. No chills. No sick contacts. No recent antibiotic use. Has not eaten out anywhere. Denies any weight loss. She's also had a cough and wheezing for the last couple of days. She also tells me that she's had abdominal pain in the same area on and off for the last 6 months. Denies any difficulty swallowing. Denies any history of heartburns. Denies any blood in the stool or black colored stools. Denies any vaginal discharge or bleeding.  Home Medications: Prior to Admission medications   Medication Sig Start Date End Date Taking? Authorizing Provider  acetaminophen (TYLENOL) 500 MG tablet Take 1,000 mg by mouth every 6 (six) hours as needed for mild pain.   Yes Historical Provider, MD  albuterol (PROVENTIL HFA;VENTOLIN HFA) 108 (90 BASE) MCG/ACT inhaler Inhale 2 puffs into the lungs every 6 (six) hours as needed for wheezing or shortness of breath.   Yes Historical Provider, MD  aspirin 81 MG chewable tablet Chew 81 mg by mouth daily.   Yes Historical Provider, MD  ranitidine (ZANTAC) 150 MG tablet Take 150 mg by mouth daily.   Yes Historical Provider, MD    Allergies: No Known Allergies  Past Medical History: Past  Medical History  Diagnosis Date  . Stroke   . Asthma     Past Surgical History  Procedure Laterality Date  . Cholecystectomy    . Bladder surgery      Social History: Lives in [place] with her husband. No smoking, alcohol use illicit drug use. Independent with daily activities usually.  Family History:  Family History  Problem Relation Age of Onset  . Pancreatic cancer Mother   . Stroke Mother   . Diabetes Father   . Hypertension Father      Review of Systems - History obtained from the patient General ROS: positive for  - fatigue Psychological ROS: positive for - anxiety Ophthalmic ROS: negative ENT ROS: negative Allergy and Immunology ROS: negative Hematological and Lymphatic ROS: negative Endocrine ROS: negative Respiratory ROS: as in hpi Cardiovascular ROS: no chest pain or dyspnea on exertion Gastrointestinal ROS: as in hpi Genito-Urinary ROS: no dysuria, trouble voiding, or hematuria Musculoskeletal ROS: negative Neurological ROS: no TIA or stroke symptoms Dermatological ROS: negative  Physical Examination  Filed Vitals:   04/07/14 1736 04/07/14 2010  BP: 144/85 124/74  Pulse: 89 72  Temp: 97.7 F (36.5 C)   TempSrc: Oral   Resp: 16 20  Height: 4' 9"  (1.448 m)   Weight: 56.246 kg (124 lb)   SpO2: 100% 99%    BP 124/74  Pulse 72  Temp(Src) 97.7 F (36.5 C) (Oral)  Resp 20  Ht 4' 9"  (1.448 m)  Wt 56.246 kg (124 lb)  BMI 26.83 kg/m2  SpO2 99%  LMP 03/31/2014  General appearance: alert, cooperative, appears stated age and no distress Head: Normocephalic, without obvious abnormality, atraumatic Eyes: conjunctivae/corneas clear. PERRL, EOM's intact.  Throat: very dry mm without oral lesions Neck: no adenopathy, no carotid bruit, no JVD, supple, symmetrical, trachea midline and thyroid not enlarged, symmetric, no tenderness/mass/nodules Resp: end expiratory wheezing bilaterally, without any crackles. Cardio: regular rate and rhythm, S1, S2 normal, no  murmur, click, rub or gallop GI: Abdomen is soft. Tenderness is present. The epigastric area without any rebound, rigidity, or guarding. No masses, organomegaly. Bowel sounds are present Extremities: extremities normal, atraumatic, no cyanosis or edema Pulses: 2+ and symmetric Skin: Skin color, texture, turgor normal. No rashes or lesions Lymph nodes: Cervical, supraclavicular, and axillary nodes normal. Neurologic: She is alert and oriented x3. No focal neurological deficits are noted.  Laboratory Data: Results for orders placed during the hospital encounter of 04/07/14 (from the past 48 hour(s))  CBC WITH DIFFERENTIAL     Status: Abnormal   Collection Time    04/07/14  5:53 PM      Result Value Ref Range   WBC 9.0  4.0 - 10.5 K/uL   RBC 5.12 (*) 3.87 - 5.11 MIL/uL   Hemoglobin 10.6 (*) 12.0 - 15.0 g/dL   HCT 32.7 (*) 36.0 - 46.0 %   MCV 63.9 (*) 78.0 - 100.0 fL   MCH 20.7 (*) 26.0 - 34.0 pg   MCHC 32.4  30.0 - 36.0 g/dL   RDW 15.5  11.5 - 15.5 %   Platelets 359  150 - 400 K/uL   Neutrophils Relative % 43  43 - 77 %   Neutro Abs 3.9  1.7 - 7.7 K/uL   Lymphocytes Relative 38  12 - 46 %   Lymphs Abs 3.4  0.7 - 4.0 K/uL   Monocytes Relative 8  3 - 12 %   Monocytes Absolute 0.7  0.1 - 1.0 K/uL   Eosinophils Relative 11 (*) 0 - 5 %   Eosinophils Absolute 1.0 (*) 0.0 - 0.7 K/uL   Basophils Relative 1  0 - 1 %   Basophils Absolute 0.1  0.0 - 0.1 K/uL  COMPREHENSIVE METABOLIC PANEL     Status: Abnormal   Collection Time    04/07/14  5:53 PM      Result Value Ref Range   Sodium 139  137 - 147 mEq/L   Potassium 2.9 (*) 3.7 - 5.3 mEq/L   Comment: CRITICAL RESULT CALLED TO, READ BACK BY AND VERIFIED WITH:     C.EDWARDS AT 1852 ON 04/07/14 BY S.VANHOORNE   Chloride 99  96 - 112 mEq/L   CO2 27  19 - 32 mEq/L   Glucose, Bld 84  70 - 99 mg/dL   BUN 10  6 - 23 mg/dL   Creatinine, Ser 0.98  0.50 - 1.10 mg/dL   Calcium 9.1  8.4 - 10.5 mg/dL   Total Protein 7.2  6.0 - 8.3 g/dL   Albumin  4.1  3.5 - 5.2 g/dL   AST 39 (*) 0 - 37 U/L   ALT 24  0 - 35 U/L   Alkaline Phosphatase 69  39 - 117 U/L   Total Bilirubin 0.5  0.3 - 1.2 mg/dL   GFR calc non Af Amer 72 (*) >90 mL/min   GFR calc Af Amer 84 (*) >90 mL/min   Comment: (NOTE)     The eGFR has been calculated using the  CKD EPI equation.     This calculation has not been validated in all clinical situations.     eGFR's persistently <90 mL/min signify possible Chronic Kidney     Disease.  LIPASE, BLOOD     Status: Abnormal   Collection Time    04/07/14  5:53 PM      Result Value Ref Range   Lipase 123 (*) 11 - 59 U/L  URINALYSIS, ROUTINE W REFLEX MICROSCOPIC     Status: Abnormal   Collection Time    04/07/14  7:11 PM      Result Value Ref Range   Color, Urine YELLOW  YELLOW   APPearance CLOUDY (*) CLEAR   Specific Gravity, Urine >1.030 (*) 1.005 - 1.030   pH 6.0  5.0 - 8.0   Glucose, UA NEGATIVE  NEGATIVE mg/dL   Hgb urine dipstick MODERATE (*) NEGATIVE   Bilirubin Urine NEGATIVE  NEGATIVE   Ketones, ur NEGATIVE  NEGATIVE mg/dL   Protein, ur NEGATIVE  NEGATIVE mg/dL   Urobilinogen, UA 0.2  0.0 - 1.0 mg/dL   Nitrite NEGATIVE  NEGATIVE   Leukocytes, UA NEGATIVE  NEGATIVE  PREGNANCY, URINE     Status: None   Collection Time    04/07/14  7:11 PM      Result Value Ref Range   Preg Test, Ur NEGATIVE  NEGATIVE   Comment:            THE SENSITIVITY OF THIS     METHODOLOGY IS >20 mIU/mL.  URINE MICROSCOPIC-ADD ON     Status: Abnormal   Collection Time    04/07/14  7:11 PM      Result Value Ref Range   Squamous Epithelial / LPF MANY (*) RARE   WBC, UA 0-2  <3 WBC/hpf   RBC / HPF 3-6  <3 RBC/hpf   Bacteria, UA MANY (*) RARE    Radiology Reports: Ct Abdomen Pelvis W Contrast  04/07/2014   CLINICAL DATA:  Abdominal pain.  EXAM: CT ABDOMEN AND PELVIS WITH CONTRAST  TECHNIQUE: Multidetector CT imaging of the abdomen and pelvis was performed using the standard protocol following bolus administration of intravenous  contrast.  CONTRAST:  50 mL OMNIPAQUE IOHEXOL 300 MG/ML SOLN, 141m OMNIPAQUE IOHEXOL 300 MG/ML SOLN  COMPARISON:  CT abdomen and pelvis 02/28/2014 and 12/21/2011.  FINDINGS: The lung bases are clear.  No pleural or pericardial effusion.  The patient is status post cholecystectomy. Intra and extrahepatic biliary ductal dilatation seen on prior examinations is stable. No focal liver lesion is identified. The adrenal glands, spleen, pancreas and kidneys appear normal. Uterus, adnexa and urinary bladder are unremarkable. Stomach and small and large bowel appear normal. The appendix is well seen and normal in appearance. There is no lymphadenopathy or fluid. No focal bony abnormality is identified.  IMPRESSION: No acute finding abdomen or pelvis. No finding to explain the patient's symptoms.  Status post cholecystectomy.   Electronically Signed   By: TInge RiseM.D.   On: 04/07/2014 20:37   Dg Abd Acute W/chest  04/07/2014   CLINICAL DATA:  Abdominal pain with nausea vomiting and diarrhea for the preceding 5 days  EXAM: ACUTE ABDOMEN SERIES (ABDOMEN 2 VIEW & CHEST 1 VIEW)  COMPARISON:  DG CHEST 1V PORT dated 03/04/2014; CT ABD - PELV W/ CM dated 02/28/2014  FINDINGS: The lungs are well-expanded and clear. The cardiopericardial silhouette is normal in size. The mediastinum is normal in width. There is no pleural effusion. The observed portions  of the bony thorax appear normal.  Within the abdomen there is a moderate stool burden within the colon. There is no evidence of a small or large bowel obstruction or ileus. There are surgical clips in the gallbladder fossa. There are calcifications within the pelvis likely reflecting phleboliths. No abnormal calcifications project over either kidney. The lumbar spine and bony pelvis appear normal where visualized.  IMPRESSION: 1. The bowel gas pattern may reflect clinical constipation. There is no evidence of ileus, obstruction, or perforation of the bowel. 2. There is no  evidence of active cardiopulmonary disease.   Electronically Signed   By: David  [place]   On: 04/07/2014 18:48    Problem List  Principal Problem:   Acute pancreatitis Active Problems:   Abdominal pain   Nausea and vomiting in adult   Acute diarrhea   Asthma   Assessment: This is a 38 year old, Caucasian female, with a past medical history remarkable for stroke in 2008 without any residual deficits, who presents with upper abdominal pain, which has been worse over the last 3 days. There is a chronic component to this pain as well. She is noted to have elevated lipase, and hypokalemia, and is clearly dehydrated. She could have acute pancreatitis. However, she's also had nausea, vomiting, and diarrhea and could have acute gastroenteritis as well. AST is mildly elevated and will be repeated in the morning. UA is abnormal, but appears to be a contaminated sample.  Plan: #1 abdominal pain with elevated lipase, possible acute pancreatitis: Lipase level will be repeated in the morning. She'll be kept on ice chips tonight. She'll be given IV fluids. Pain medications. Triglycerides will be checked. CT did not show any concerning findings. She is status post cholecystectomy. Etiology for pancreatitis, if present, is not clear. Denies any alcohol intake.  #2 nausea, vomiting, and diarrhea: This could suggest acute gastroenteritis. Stool will be checked for C. difficile.  #3 microcytic anemia: Last menstrual period was a week ago. Does report some heavy bleeding at times. Check anemia panel. Check stool for occult blood.  #4 chronic component to her abdominal pain: We will put her on a PPI for now. Denies any weight loss. CT does not show any concerning findings. This can be further addressed as an outpatient.  #5 history of asthma with mild exacerbation: She'll be placed on albuterol nebulizers. X-ray does not show any abnormality in the lungs. Hold off on steroids for now and consider if she does not  improve with nebulizer treatments.   DVT Prophylaxis: Lovenox Code Status: Full code Family Communication: Discussed with the patient  Disposition Plan: Observe to Medsurg  Further management decisions will depend on results of further testing and patient's response to treatment.   Bonnielee Haff  [facility] Pager [phone]  If 7PM-7AM, please contact night-coverage [email or web address] Password [facility]  04/07/2014, 9:32 PM

## 2014-04-08 ENCOUNTER — Observation Stay (HOSPITAL_COMMUNITY): Payer: Self-pay

## 2014-04-08 DIAGNOSIS — J45909 Unspecified asthma, uncomplicated: Secondary | ICD-10-CM

## 2014-04-08 DIAGNOSIS — E876 Hypokalemia: Secondary | ICD-10-CM

## 2014-04-08 DIAGNOSIS — I509 Heart failure, unspecified: Secondary | ICD-10-CM

## 2014-04-08 DIAGNOSIS — R112 Nausea with vomiting, unspecified: Secondary | ICD-10-CM

## 2014-04-08 DIAGNOSIS — K859 Acute pancreatitis without necrosis or infection, unspecified: Secondary | ICD-10-CM

## 2014-04-08 DIAGNOSIS — R109 Unspecified abdominal pain: Secondary | ICD-10-CM

## 2014-04-08 LAB — COMPREHENSIVE METABOLIC PANEL
ALBUMIN: 3.4 g/dL — AB (ref 3.5–5.2)
ALK PHOS: 61 U/L (ref 39–117)
ALT: 17 U/L (ref 0–35)
AST: 26 U/L (ref 0–37)
BILIRUBIN TOTAL: 0.5 mg/dL (ref 0.3–1.2)
BUN: 7 mg/dL (ref 6–23)
CO2: 28 meq/L (ref 19–32)
Calcium: 8.1 mg/dL — ABNORMAL LOW (ref 8.4–10.5)
Chloride: 105 mEq/L (ref 96–112)
Creatinine, Ser: 0.91 mg/dL (ref 0.50–1.10)
GFR calc Af Amer: >90 mL/min (ref >90)
GFR, EST NON AFRICAN AMERICAN: 79 mL/min — AB (ref >90)
Glucose, Bld: 89 mg/dL (ref 70–99)
POTASSIUM: 4.1 meq/L (ref 3.7–5.3)
Sodium: 142 mEq/L (ref 137–147)
Total Protein: 6.1 g/dL (ref 6.0–8.3)

## 2014-04-08 LAB — IRON AND TIBC
IRON: 77 ug/dL (ref 42–135)
SATURATION RATIOS: 26 % (ref 20–55)
TIBC: 299 ug/dL (ref 250–470)
UIBC: 222 ug/dL (ref 125–400)

## 2014-04-08 LAB — CBC
HCT: 29.9 % — ABNORMAL LOW (ref 36.0–46.0)
Hemoglobin: 9.5 g/dL — ABNORMAL LOW (ref 12.0–15.0)
MCH: 20.4 pg — AB (ref 26.0–34.0)
MCHC: 31.8 g/dL (ref 30.0–36.0)
MCV: 64.3 fL — AB (ref 78.0–100.0)
PLATELETS: 311 10*3/uL (ref 150–400)
RBC: 4.65 MIL/uL (ref 3.87–5.11)
RDW: 15.5 % (ref 11.5–15.5)
WBC: 7.8 10*3/uL (ref 4.0–10.5)

## 2014-04-08 LAB — RETICULOCYTES
RBC.: 4.65 MIL/uL (ref 3.87–5.11)
RETIC COUNT ABSOLUTE: 55.8 10*3/uL (ref 19.0–186.0)
Retic Ct Pct: 1.2 % (ref 0.4–3.1)

## 2014-04-08 LAB — LIPASE, BLOOD: Lipase: 23 U/L (ref 11–59)

## 2014-04-08 LAB — PRO B NATRIURETIC PEPTIDE: Pro B Natriuretic peptide (BNP): 67.7 pg/mL (ref 0–125)

## 2014-04-08 LAB — VITAMIN B12: Vitamin B-12: 393 pg/mL (ref 211–911)

## 2014-04-08 LAB — CLOSTRIDIUM DIFFICILE BY PCR: CDIFFPCR: NEGATIVE

## 2014-04-08 LAB — TSH: TSH: 1.89 u[IU]/mL (ref 0.350–4.500)

## 2014-04-08 LAB — FERRITIN: FERRITIN: 26 ng/mL (ref 10–291)

## 2014-04-08 LAB — FOLATE: Folate: 5.4 ng/mL

## 2014-04-08 MED ORDER — PROMETHAZINE HCL 25 MG/ML IJ SOLN
12.5000 mg | INTRAMUSCULAR | Status: DC | PRN
  Administered 2014-04-08 – 2014-04-13 (×23): 12.5 mg via INTRAVENOUS
  Filled 2014-04-08 (×24): qty 1

## 2014-04-08 MED ORDER — IOHEXOL 350 MG/ML SOLN
100.0000 mL | Freq: Once | INTRAVENOUS | Status: AC | PRN
  Administered 2014-04-08: 100 mL via INTRAVENOUS

## 2014-04-08 MED ORDER — IOHEXOL 300 MG/ML  SOLN: 100.0000 mL | Freq: Once | INTRAMUSCULAR | Status: AC | PRN

## 2014-04-08 MED ORDER — METHYLPREDNISOLONE SODIUM SUCC 125 MG IJ SOLR
60.0000 mg | Freq: Four times a day (QID) | INTRAMUSCULAR | Status: DC
  Administered 2014-04-08 – 2014-04-10 (×9): 60 mg via INTRAVENOUS
  Filled 2014-04-08 (×9): qty 2

## 2014-04-08 NOTE — Progress Notes (Signed)
*  PRELIMINARY RESULTS* Echocardiogram 2D Echocardiogram has been performed.  Nicole PullerJohanna R Ameera Miles 04/08/2014, 11:18 AM

## 2014-04-08 NOTE — Progress Notes (Signed)
UR Completed.  [name] [name] [phone] 04/08/2014  

## 2014-04-08 NOTE — Progress Notes (Signed)
[facility] PROGRESS NOTE  [name]:[id number] DOB: Oct 11, 1976 DOA: 04/07/2014 PCP: No primary provider on file.  Assessment/Plan: 1. Dyspnea/hypoxia- likely due to asthma exacerbation, but she does not have significant wheezing. Will check CT angiogram for PE, also obtain 2-D echocardiogram. We'll start the patient on Solu-Medrol, continue albuterol nebulizer treatments. 2. Abdominal pain, mild pancreatitis- lipase is back to normal, will initiate clear liquid diet. Continue the IV fluids. 3. Microcytic anemia- hemoglobin today dropped to 9.5, likely due to the dilution effect of the IV fluids. Will monitor the CBC in a.m. 4. Hypokalemia- potassium replaced, and potassium was 4.1. 5. DVT prophylaxis- Lovenox  Code Status: Full code Family Communication: No family at bedside Disposition Plan: *Home when stable   Consultants:  None  Procedures:  *None  Antibiotics:  None  HPI/Subjective: 38 y.o. 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 There was no radiation. The pain was described as a sharp pain, 8/10 in intensity at its worst. Associated with nausea and multiple episodes of vomiting. She's also had diarrhea for the last 2 days. It has been watery without any blood. The emesis has also been nonbloody. Denies any fever. No chills. No sick contacts. No recent antibiotic use. Has not eaten out anywhere. Denies any weight loss. She's also had a cough and wheezing for the last couple of days. She also tells me that she's had abdominal pain in the same area on and off for the last 6 months. Denies any difficulty swallowing. Denies any history of heartburns. Denies any blood in the stool or black colored stools. Denies any vaginal discharge or bleeding  Patient became  hypoxic last night with O2 sats 80% on room air, she does feel short of breath on walking. She denies wheezing patient does have a history of asthma. At this time she is requiring 2 L oxygen via nasal cannula. She still has some nausea but denies vomiting. Lipase has come down to normal.  Objective: Filed Vitals:   04/08/14 0706  BP: 125/81  Pulse: 87  Temp: 98.1 F (36.7 C)  Resp: 20    Intake/Output Summary (Last 24 hours) at 04/08/14 0918 Last data filed at 04/08/14 0814  Gross per 24 hour  Intake      0 ml  Output   1800 ml  Net  -1800 ml   Filed Weights   04/07/14 1736 04/07/14 2226  Weight: 56.246 kg (124 lb) 58.7 kg (129 lb 6.6 oz)    Exam:  Physical Exam: Head: Normocephalic, atraumatic.  Eyes: No signs of jaundice, EOMI Nose: Mucous membranes dry.  Throat: Oropharynx nonerythematous, no exudate appreciated.  Neck: supple,No deformities, masses, or tenderness noted. Lungs: Decreased breath sounds at lung bases, scattered wheezing Heart: Regular RR. S1 and S2 normal  Abdomen: BS normoactive. Soft, Nondistended, non-tender.  Extremities: No pretibial edema, no erythema   Data Reviewed: Basic Metabolic Panel:  Recent Labs Lab 04/07/14 1753 04/08/14 0521  NA 139 142  K 2.9* 4.1  CL 99 105  CO2 27 28  GLUCOSE 84 89  BUN 10 7  CREATININE 0.98 0.91  CALCIUM 9.1 8.1*  MG 2.0  --    Liver Function Tests:  Recent Labs Lab 04/07/14 1753 04/08/14 0521  AST 39* 26  ALT 24 17  ALKPHOS 69 61  BILITOT 0.5 0.5  PROT 7.2 6.1  ALBUMIN 4.1 3.4*    Recent Labs Lab 04/07/14 1753 04/08/14 0521  LIPASE 123* 23   No results found for this basename: AMMONIA,  in the last 168 hours CBC:  Recent Labs Lab 04/07/14 1753 04/08/14 0521  WBC 9.0 7.8  NEUTROABS 3.9  --   HGB 10.6* 9.5*  HCT 32.7* 29.9*  MCV 63.9* 64.3*  PLT 359 311   Cardiac Enzymes: No results found for this basename: CKTOTAL, CKMB, CKMBINDEX, TROPONINI,  in the last 168 hours BNP  (last 3 results)  Recent Labs  04/08/14 0521  PROBNP 67.7   CBG: No results found for this basename: GLUCAP,  in the last 168 hours  No results found for this or any previous visit (from the past 240 hour(s)).   Studies: Ct Abdomen Pelvis W Contrast  04/07/2014   CLINICAL DATA:  Abdominal pain.  EXAM: CT ABDOMEN AND PELVIS WITH CONTRAST  TECHNIQUE: Multidetector CT imaging of the abdomen and pelvis was performed using the standard protocol following bolus administration of intravenous contrast.  CONTRAST:  50 mL OMNIPAQUE IOHEXOL 300 MG/ML SOLN, 100mL OMNIPAQUE IOHEXOL 300 MG/ML SOLN  COMPARISON:  CT abdomen and pelvis 02/28/2014 and 12/21/2011.  FINDINGS: The lung bases are clear.  No pleural or pericardial effusion.  The patient is status post cholecystectomy. Intra and extrahepatic biliary ductal dilatation seen on prior examinations is stable. No focal liver lesion is identified. The adrenal glands, spleen, pancreas and kidneys appear normal. Uterus, adnexa and urinary bladder are unremarkable. Stomach and small and large bowel appear normal. The appendix is well seen and normal in appearance. There is no lymphadenopathy or fluid. No focal bony abnormality is identified.  IMPRESSION: No acute finding abdomen or pelvis. No finding to explain the patient's symptoms.  Status post cholecystectomy.   Electronically Signed   By: Drusilla Kannerhomas  Dalessio M.D.   On: 04/07/2014 20:37   Dg Abd Acute W/chest  04/07/2014   CLINICAL DATA:  Abdominal pain with nausea vomiting and diarrhea for the preceding 5 days  EXAM: ACUTE ABDOMEN SERIES (ABDOMEN 2 VIEW & CHEST 1 VIEW)  COMPARISON:  DG CHEST 1V PORT dated 03/04/2014; CT ABD - PELV W/ CM dated 02/28/2014  FINDINGS: The lungs are well-expanded and clear. The cardiopericardial silhouette is normal in size. The mediastinum is normal in width. There is no pleural effusion. The observed portions of the bony thorax appear normal.  Within the abdomen there is a moderate  stool burden within the colon. There is no evidence of a small or large bowel obstruction or ileus. There are surgical clips in the gallbladder fossa. There are calcifications within the pelvis likely reflecting phleboliths. No abnormal calcifications project over either kidney. The lumbar spine and bony pelvis appear normal where visualized.  IMPRESSION: 1. The bowel gas pattern may reflect clinical constipation. There is no evidence of ileus, obstruction, or perforation of the bowel. 2. There is no evidence of active cardiopulmonary disease.   Electronically Signed   By: David  [place]   On: 04/07/2014 18:48    Scheduled Meds: . albuterol  2.5 mg Nebulization Q6H  . aspirin  81 mg Oral Daily  . enoxaparin (LOVENOX) injection  40 mg Subcutaneous Q24H  . pantoprazole (PROTONIX) IV  40 mg Intravenous Q12H   Continuous Infusions: . 0.9 % NaCl with KCl 20 mEq / L 75 mL/hr (04/08/14 [id number])    Principal Problem:   Acute pancreatitis Active  Problems:   Abdominal pain   Nausea and vomiting in adult   Acute diarrhea   Asthma    Time spent:  30 minutes   Meredeth IdeGagan S Jarah Pember  [facility] Pager [phone] If 7PM-7AM, please contact night-coverage at [email or web address], password [email or web address] 04/08/2014, 9:18 AM  LOS: 1 day

## 2014-04-09 ENCOUNTER — Inpatient Hospital Stay (HOSPITAL_COMMUNITY): Payer: MEDICAID

## 2014-04-09 LAB — OCCULT BLOOD X 1 CARD TO LAB, STOOL: FECAL OCCULT BLD: NEGATIVE

## 2014-04-09 LAB — LIPASE, BLOOD: Lipase: 15 U/L (ref 11–59)

## 2014-04-09 MED ORDER — ALPRAZOLAM ER 1 MG PO TB24: 1.0000 mg | ORAL_TABLET | Freq: Every day | ORAL | Status: DC

## 2014-04-09 MED ORDER — ALBUTEROL SULFATE (2.5 MG/3ML) 0.083% IN NEBU
2.5000 mg | INHALATION_SOLUTION | Freq: Two times a day (BID) | RESPIRATORY_TRACT | Status: DC
  Administered 2014-04-09 – 2014-04-11 (×5): 2.5 mg via RESPIRATORY_TRACT
  Filled 2014-04-09 (×5): qty 3

## 2014-04-09 MED ORDER — ALPRAZOLAM 0.5 MG PO TABS
0.5000 mg | ORAL_TABLET | Freq: Two times a day (BID) | ORAL | Status: DC
  Administered 2014-04-09 – 2014-04-11 (×5): 0.5 mg via ORAL
  Filled 2014-04-09 (×5): qty 1

## 2014-04-09 NOTE — Progress Notes (Signed)
[facility] PROGRESS NOTE  [name] WRU:[id number] DOB: 11-29-1976 DOA: 04/07/2014 PCP: No primary provider on file.  Assessment/Plan: 1. Dyspnea/hypoxia- likely due to asthma exacerbation, but she does not have significant wheezing. Will check CT angiogram for PE, also obtain 2-D echocardiogram. We'll start the patient on Solu-Medrol, continue albuterol nebulizer treatments. Echocardiogram EF of 60%, CT angiogram negative for any acute chest pathology 2. Prior CVA 2008 3. Abdominal pain, mild pancreatitis- initiate clear liquid diet. Still having abdominal pain. Patient also status post cholecystectomy. Unclear if this is retained CBD stone and would expect LFTs to be more elevated. Monitor and clear liquids. If not better in the morning will obtain ultrasound abdomen pelvis and consult GI. 4. Microcytic anemia- hemoglobin today dropped to 9.5, likely due to the dilution effect of the IV fluids. Will monitor the CBC in a.m. repeat labs in 5. Hypokalemia- potassium replaced, and potassium was 4.1. 6. DVT prophylaxis- Lovenox  Code Status: Full code Family Communication: Discussed with husband at bedside Disposition Plan: Home when ready   Consultants:  None  Procedures:  *None  Antibiotics:  None  HPI/Subjective: 38 y.o. 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 There was no radiation. The pain was described as a sharp pain, 8/10 in intensity at its worst. Associated with nausea and multiple episodes of vomiting. She's also had diarrhea for the last 2 days. It has been watery without any blood. The emesis has also been nonbloody. Denies any fever. No chills. No sick contacts. No recent antibiotic use. Has not eaten out anywhere. Denies any weight loss. She's also had a  cough and wheezing for the last couple of days. She also tells me that she's had abdominal pain in the same area on and off for the last 6 months. Denies any difficulty swallowing. Denies any history of heartburns. Denies any blood in the stool or black colored stools. Denies any vaginal discharge or bleeding  Patient may just be sitting at bedside. Could not sleep all night but this isn't present. Had one episode of diarrhea this morning No nausea vomiting or as a same with this. Abdominal pain 10/10 in her own estimation. Despite Dilaudid.  Objective: Filed Vitals:   04/09/14 0531  BP: 88/47  Pulse: 88  Temp: 97.5 F (36.4 C)  Resp: 20    Intake/Output Summary (Last 24 hours) at 04/09/14 0827 Last data filed at 04/09/14 0600  Gross per 24 hour  Intake 2877.5 ml  Output   2350 ml  Net  527.5 ml   Filed Weights   04/07/14 1736 04/07/14 2226 04/09/14 0531  Weight: 56.246 kg (124 lb) 58.7 kg (129 lb 6.6 oz) 57.1 kg (125 lb 14.1 oz)    Exam:  Physical Exam: Head: Normocephalic, atraumatic.  Eyes: No signs of jaundice, EOMI Nose: Mucous membranes dry.  Throat: Oropharynx nonerythematous, no exudate appreciated.  Neck: supple,No deformities, masses, or tenderness noted. Lungs: Decreased breath sounds at lung bases, scattered wheezing Heart: Regular RR. S1 and S2 normal  Abdomen: BS normoactive. Soft, Nondistended, non-tender.  Extremities: No pretibial edema, no erythema   Data Reviewed: Basic Metabolic Panel:  Recent Labs Lab 04/07/14 1753 04/08/14 0521  NA 139 142  K 2.9* 4.1  CL 99 105  CO2 27 28  GLUCOSE 84 89  BUN 10 7  CREATININE 0.98 0.91  CALCIUM 9.1 8.1*  MG 2.0  --    Liver Function Tests:  Recent Labs Lab 04/07/14 1753 04/08/14 0521  AST 39* 26  ALT 24 17  ALKPHOS 69 61  BILITOT 0.5 0.5  PROT 7.2 6.1  ALBUMIN 4.1 3.4*    Recent Labs Lab 04/07/14 1753 04/08/14 0521  LIPASE 123* 23   No results found for this basename: AMMONIA,  in  the last 168 hours CBC:  Recent Labs Lab 04/07/14 1753 04/08/14 0521  WBC 9.0 7.8  NEUTROABS 3.9  --   HGB 10.6* 9.5*  HCT 32.7* 29.9*  MCV 63.9* 64.3*  PLT 359 311   Cardiac Enzymes: No results found for this basename: CKTOTAL, CKMB, CKMBINDEX, TROPONINI,  in the last 168 hours BNP (last 3 results)  Recent Labs  04/08/14 0521  PROBNP 67.7   CBG: No results found for this basename: GLUCAP,  in the last 168 hours  Recent Results (from the past 240 hour(s))  CLOSTRIDIUM DIFFICILE BY PCR     Status: None   Collection Time    04/08/14  9:52 AM      Result Value Ref Range Status   C difficile by pcr NEGATIVE  NEGATIVE Final     Studies: Ct Angio Chest Pe W/cm &/or Wo Cm  04/08/2014   CLINICAL DATA:  Cough, question pulmonary embolism, upper mid abdominal pain, personal history of stroke, asthma, pancreatitis  EXAM: CT ANGIOGRAPHY CHEST WITH CONTRAST  TECHNIQUE: Multidetector CT imaging of the chest was performed using the standard protocol during bolus administration of intravenous contrast. Multiplanar CT image reconstructions and MIPs were obtained to evaluate the vascular anatomy.  CONTRAST:  100mL OMNIPAQUE IOHEXOL 350 MG/ML SOLN  COMPARISON:  None  FINDINGS: Aorta normal caliber without aneurysm or dissection.  No thoracic adenopathy.  Visualized upper abdomen significant only for prior cholecystectomy.  Pulmonary arteries well opacified and patent.  No evidence of pulmonary embolism.  Lungs clear.  No pleural effusion or pneumothorax.  Osseous structures normal appearance.  Review of the MIP images confirms the above findings.  IMPRESSION: Normal CTA chest.   Electronically Signed   By: Ulyses SouthwardMark  Boles M.D.   On: 04/08/2014 09:43   Ct Abdomen Pelvis W Contrast  04/07/2014   CLINICAL DATA:  Abdominal pain.  EXAM: CT ABDOMEN AND PELVIS WITH CONTRAST  TECHNIQUE: Multidetector CT imaging of the abdomen and pelvis was performed using the standard protocol following bolus administration  of intravenous contrast.  CONTRAST:  50 mL OMNIPAQUE IOHEXOL 300 MG/ML SOLN, 100mL OMNIPAQUE IOHEXOL 300 MG/ML SOLN  COMPARISON:  CT abdomen and pelvis 02/28/2014 and 12/21/2011.  FINDINGS: The lung bases are clear.  No pleural or pericardial effusion.  The patient is status post cholecystectomy. Intra and extrahepatic biliary ductal dilatation seen on prior examinations is stable. No focal liver lesion is identified. The adrenal glands, spleen, pancreas and kidneys appear normal. Uterus, adnexa and urinary bladder are unremarkable. Stomach and small and large bowel appear normal. The appendix is well seen and normal in appearance. There is no lymphadenopathy or fluid. No focal bony abnormality is identified.  IMPRESSION: No acute finding abdomen or pelvis. No finding to explain the patient's symptoms.  Status post cholecystectomy.   Electronically Signed   By: Drusilla Kannerhomas  Dalessio M.D.   On: 04/07/2014 20:37   Dg Abd Acute W/chest  04/07/2014   CLINICAL DATA:  Abdominal pain with nausea vomiting and diarrhea for the preceding 5  days  EXAM: ACUTE ABDOMEN SERIES (ABDOMEN 2 VIEW & CHEST 1 VIEW)  COMPARISON:  DG CHEST 1V PORT dated 03/04/2014; CT ABD - PELV W/ CM dated 02/28/2014  FINDINGS: The lungs are well-expanded and clear. The cardiopericardial silhouette is normal in size. The mediastinum is normal in width. There is no pleural effusion. The observed portions of the bony thorax appear normal.  Within the abdomen there is a moderate stool burden within the colon. There is no evidence of a small or large bowel obstruction or ileus. There are surgical clips in the gallbladder fossa. There are calcifications within the pelvis likely reflecting phleboliths. No abnormal calcifications project over either kidney. The lumbar spine and bony pelvis appear normal where visualized.  IMPRESSION: 1. The bowel gas pattern may reflect clinical constipation. There is no evidence of ileus, obstruction, or perforation of the bowel.  2. There is no evidence of active cardiopulmonary disease.   Electronically Signed   By: David  Swazil[place]Jordan   On: 04/07/2014 18:48    Scheduled Meds: . albuterol  2.5 mg Nebulization BID  . ALPRAZolam  1 mg Oral Daily  . aspirin  81 mg Oral Daily  . enoxaparin (LOVENOX) injection  40 mg Subcutaneous Q24H  . methylPREDNISolone (SOLU-MEDROL) injection  60 mg Intravenous Q6H  . pantoprazole (PROTONIX) IV  40 mg Intravenous Q12H   Continuous Infusions: . 0.9 % NaCl with KCl 20 mEq / L 75 mL/hr at 04/09/14 16100046    Principal Problem:   Acute pancreatitis Active Problems:   Abdominal pain   Nausea and vomiting in adult   Acute diarrhea   Asthma    Time spent:  30 minutes   Rhetta MuraJai-Gurmukh Neaveh Belanger  [facility] Pager [phone] 7AM, please contact night-coverage at [email or web address], password [email or web address] 04/09/2014, 8:27 AM  LOS: 2 days

## 2014-04-09 NOTE — Progress Notes (Signed)
Walked with pt in hallway. Pt stated that she went "twice as far as yesterday, but I better turn around". Pt did have some SOB while ambulating, but was able to recover quickly once back in her room and at rest. Pt then asked to stay up in chair by window. Call ball does not reach to chair when it is next to window so I explained importance of using room phone to call me directly on my phone when pt is ready to get up. Pt stated that she understood. Pt is alert and oriented x 4 and promised to call me once ready to get back in bed. Will keep door open at all times. Will continue to monitor.

## 2014-04-10 ENCOUNTER — Encounter (HOSPITAL_COMMUNITY)
Admission: EM | Disposition: A | Discharge status: Home / Self Care | Payer: Self-pay | Source: Home / Self Care | Attending: Family Medicine

## 2014-04-10 ENCOUNTER — Encounter (HOSPITAL_COMMUNITY): Payer: Self-pay | Admitting: *Deleted

## 2014-04-10 DIAGNOSIS — K2960 Other gastritis without bleeding: Secondary | ICD-10-CM

## 2014-04-10 DIAGNOSIS — K228 Other specified diseases of esophagus: Secondary | ICD-10-CM

## 2014-04-10 DIAGNOSIS — R1013 Epigastric pain: Secondary | ICD-10-CM

## 2014-04-10 DIAGNOSIS — K2289 Other specified disease of esophagus: Secondary | ICD-10-CM

## 2014-04-10 DIAGNOSIS — R112 Nausea with vomiting, unspecified: Secondary | ICD-10-CM

## 2014-04-10 DIAGNOSIS — K257 Chronic gastric ulcer without hemorrhage or perforation: Secondary | ICD-10-CM

## 2014-04-10 HISTORY — PX: ESOPHAGOGASTRODUODENOSCOPY: SHX5428

## 2014-04-10 LAB — COMPREHENSIVE METABOLIC PANEL
ALK PHOS: 68 U/L (ref 39–117)
ALT: 13 U/L (ref 0–35)
AST: 17 U/L (ref 0–37)
Albumin: 3.6 g/dL (ref 3.5–5.2)
BUN: 14 mg/dL (ref 6–23)
CALCIUM: 8.7 mg/dL (ref 8.4–10.5)
CHLORIDE: 104 meq/L (ref 96–112)
CO2: 29 meq/L (ref 19–32)
Creatinine, Ser: 0.73 mg/dL (ref 0.50–1.10)
GFR calc Af Amer: >90 mL/min (ref >90)
GFR calc non Af Amer: >90 mL/min (ref >90)
Glucose, Bld: 133 mg/dL — ABNORMAL HIGH (ref 70–99)
POTASSIUM: 4.1 meq/L (ref 3.7–5.3)
SODIUM: 143 meq/L (ref 137–147)
Total Bilirubin: 0.4 mg/dL (ref 0.3–1.2)
Total Protein: 6.7 g/dL (ref 6.0–8.3)

## 2014-04-10 SURGERY — EGD (ESOPHAGOGASTRODUODENOSCOPY)
Anesthesia: Moderate Sedation

## 2014-04-10 MED ORDER — MEPERIDINE HCL 50 MG/ML IJ SOLN
INTRAMUSCULAR | Status: AC
  Filled 2014-04-10: qty 1

## 2014-04-10 MED ORDER — MIDAZOLAM HCL 5 MG/5ML IJ SOLN
INTRAMUSCULAR | Status: AC
  Filled 2014-04-10: qty 10

## 2014-04-10 MED ORDER — MEPERIDINE HCL 50 MG/ML IJ SOLN
INTRAMUSCULAR | Status: DC | PRN
  Administered 2014-04-10 (×4): 25 mg via INTRAVENOUS

## 2014-04-10 MED ORDER — PROMETHAZINE HCL 25 MG/ML IJ SOLN
INTRAMUSCULAR | Status: AC
  Filled 2014-04-10: qty 1

## 2014-04-10 MED ORDER — BUTAMBEN-TETRACAINE-BENZOCAINE 2-2-14 % EX AERO
INHALATION_SPRAY | CUTANEOUS | Status: DC | PRN
  Administered 2014-04-10: 2 via TOPICAL

## 2014-04-10 MED ORDER — SUCRALFATE 1 GM/10ML PO SUSP
1.0000 g | Freq: Three times a day (TID) | ORAL | Status: DC
Start: 2014-04-10 — End: 2014-04-14
  Administered 2014-04-10 – 2014-04-14 (×16): 1 g via ORAL
  Filled 2014-04-10 (×16): qty 10

## 2014-04-10 MED ORDER — PANTOPRAZOLE SODIUM 40 MG PO TBEC
40.0000 mg | DELAYED_RELEASE_TABLET | Freq: Two times a day (BID) | ORAL | Status: DC
  Administered 2014-04-10 – 2014-04-14 (×8): 40 mg via ORAL
  Filled 2014-04-10 (×8): qty 1

## 2014-04-10 MED ORDER — MIDAZOLAM HCL 5 MG/5ML IJ SOLN
INTRAMUSCULAR | Status: DC | PRN
  Administered 2014-04-10: 3 mg via INTRAVENOUS
  Administered 2014-04-10: 2 mg via INTRAVENOUS
  Administered 2014-04-10: 3 mg via INTRAVENOUS
  Administered 2014-04-10 (×2): 2 mg via INTRAVENOUS
  Administered 2014-04-10: 3 mg via INTRAVENOUS

## 2014-04-10 MED ORDER — MIDAZOLAM HCL 5 MG/5ML IJ SOLN
INTRAMUSCULAR | Status: AC
  Filled 2014-04-10: qty 5

## 2014-04-10 MED ORDER — SODIUM CHLORIDE 0.9 % IJ SOLN
INTRAMUSCULAR | Status: AC
  Filled 2014-04-10: qty 10

## 2014-04-10 MED ORDER — PREDNISONE 20 MG PO TABS
60.0000 mg | ORAL_TABLET | Freq: Every day | ORAL | Status: AC
  Administered 2014-04-11 – 2014-04-14 (×4): 60 mg via ORAL
  Filled 2014-04-10 (×4): qty 3

## 2014-04-10 MED ORDER — PROMETHAZINE HCL 25 MG/ML IJ SOLN
INTRAMUSCULAR | Status: DC | PRN
  Administered 2014-04-10 (×2): 6.25 mg via INTRAVENOUS

## 2014-04-10 MED ORDER — SIMETHICONE 40 MG/0.6ML PO SUSP
ORAL | Status: DC | PRN
  Administered 2014-04-10: 16:00:00

## 2014-04-10 MED ORDER — SODIUM CHLORIDE 0.9 % IV SOLN
INTRAVENOUS | Status: DC
  Administered 2014-04-10: 13:00:00 via INTRAVENOUS

## 2014-04-10 MED ORDER — HYOSCYAMINE SULFATE 0.125 MG SL SUBL
0.1250 mg | SUBLINGUAL_TABLET | Freq: Three times a day (TID) | SUBLINGUAL | Status: DC
  Administered 2014-04-10 – 2014-04-11 (×2): 0.125 mg via SUBLINGUAL
  Filled 2014-04-10 (×3): qty 1

## 2014-04-10 NOTE — Progress Notes (Signed)
[facility] PROGRESS NOTE  [name] UJW:[id number]:[id number] DOB: [birth year] DOA: 04/07/2014 PCP: No primary provider on file.  Assessment/Plan: 1. Dyspnea/hypoxia- likely due to asthma exacerbation, but she does not have significant wheezing. Will check CT angiogram for PE, also obtain 2-D echocardiogram. Transitioned Solu-Medrol to prednisone 60 mg Echocardiogram EF of 60%, CT angiogram negative for any acute chest pathology 2. Prior CVA 2008 3. Abdominal pain, mild pancreatitis- initiate clear liquid diet. Still having abdominal pain. = Although is better. Now tells me that there is a definitive food pain relationship with worsening pain with food. She does take occasional over-the-counter ibuprofen gelcaps. I will keep her n.p.o. and have GI evaluate her for possible endoscopy although she has no alarm symptoms currently. She may be able to go home and have this done scheduled as an outpatient or empirically try Carafate and twice a day pantoprazole 4. Microcytic anemia- hemoglobin today dropped to 9.5, likely due to the dilution effect of the IV fluids. Repeat labs in 5. Hypokalemia- potassium replaced, and potassium was 4.1. 6. DVT prophylaxis- Lovenox  Code Status: Full code Family Communication: Discussed with husband at bedside Disposition Plan: Home when ready   Consultants:  None  Procedures:  *None  Antibiotics:  None  HPI/Subjective:  Patient feels a little better however still feels nauseous. She tells me that this is more with eating and drinking the soup She has no chest pain or vomiting currently no dark stools No stool this morning  Objective: Filed Vitals:   04/10/14 0545  BP: 97/60  Pulse: 67  Temp: 97.9 F (36.6 C)  Resp: 20    Intake/Output Summary (Last 24 hours) at 04/10/14 0905 Last data filed at 04/09/14 1927  Gross per 24 hour  Intake   1030 ml  Output    850 ml  Net    180 ml   Filed Weights   04/07/14 1736 04/07/14 2226 04/09/14  0531  Weight: 56.246 kg (124 lb) 58.7 kg (129 lb 6.6 oz) 57.1 kg (125 lb 14.1 oz)    Exam:  Physical Exam: Head: Normocephalic, atraumatic.  Eyes: No signs of jaundice, EOMI Nose: Mucous membranes dry.  Throat: Oropharynx nonerythematous, no exudate appreciated.  Neck: supple,No deformities, masses, or tenderness noted. Lungs: Decreased breath sounds at lung bases, scattered wheezing Heart: Regular RR. S1 and S2 normal  Abdomen: BS normoactive. Soft, Nondistended, tender epigastric area  Extremities: No pretibial edema, no erythema   Data Reviewed: Basic Metabolic Panel:  Recent Labs Lab 04/07/14 1753 04/08/14 0521 04/10/14 0523  NA 139 142 143  K 2.9* 4.1 4.1  CL 99 105 104  CO2 27 28 29   GLUCOSE 84 89 133*  BUN 10 7 14   CREATININE 0.98 0.91 0.73  CALCIUM 9.1 8.1* 8.7  MG 2.0  --   --    Liver Function Tests:  Recent Labs Lab 04/07/14 1753 04/08/14 0521 04/10/14 0523  AST 39* 26 17  ALT 24 17 13   ALKPHOS 69 61 68  BILITOT 0.5 0.5 0.4  PROT 7.2 6.1 6.7  ALBUMIN 4.1 3.4* 3.6    Recent Labs Lab 04/07/14 1753 04/08/14 0521 04/09/14 0849  LIPASE 123* 23 15   No results found for this basename: AMMONIA,  in the last 168 hours CBC:  Recent Labs Lab 04/07/14 1753 04/08/14 0521  WBC 9.0 7.8  NEUTROABS 3.9  --   HGB 10.6* 9.5*  HCT 32.7* 29.9*  MCV 63.9* 64.3*  PLT 359 311   Cardiac Enzymes:  No results found for this basename: CKTOTAL, CKMB, CKMBINDEX, TROPONINI,  in the last 168 hours BNP (last 3 results)  Recent Labs  04/08/14 0521  PROBNP 67.7   CBG: No results found for this basename: GLUCAP,  in the last 168 hours  Recent Results (from the past 240 hour(s))  CLOSTRIDIUM DIFFICILE BY PCR     Status: None   Collection Time    04/08/14  9:52 AM      Result Value Ref Range Status   C difficile by pcr NEGATIVE  NEGATIVE Final     Studies: Ct Angio Chest Pe W/cm &/or Wo Cm  04/08/2014   CLINICAL DATA:  Cough, question pulmonary  embolism, upper mid abdominal pain, personal history of stroke, asthma, pancreatitis  EXAM: CT ANGIOGRAPHY CHEST WITH CONTRAST  TECHNIQUE: Multidetector CT imaging of the chest was performed using the standard protocol during bolus administration of intravenous contrast. Multiplanar CT image reconstructions and MIPs were obtained to evaluate the vascular anatomy.  CONTRAST:  100mL OMNIPAQUE IOHEXOL 350 MG/ML SOLN  COMPARISON:  None  FINDINGS: Aorta normal caliber without aneurysm or dissection.  No thoracic adenopathy.  Visualized upper abdomen significant only for prior cholecystectomy.  Pulmonary arteries well opacified and patent.  No evidence of pulmonary embolism.  Lungs clear.  No pleural effusion or pneumothorax.  Osseous structures normal appearance.  Review of the MIP images confirms the above findings.  IMPRESSION: Normal CTA chest.   Electronically Signed   By: Ulyses SouthwardMark  Boles M.D.   On: 04/08/2014 09:43   [place] Abdomen Complete  04/09/2014   CLINICAL DATA:  Status post cholecystectomy. Upper abdominal pain. Question common bile duct stone.  EXAM: ULTRASOUND ABDOMEN COMPLETE  COMPARISON:  CT abdomen and pelvis 04/07/2014, 12/21/2011 and 10/14/2011.  FINDINGS: Gallbladder:  Removed  Common bile duct:  Diameter: Dilated at 1.1 cm. The appearance is not markedly changed. No stone is seen within the duct.  Liver:  No focal lesion identified. Within normal limits in parenchymal echogenicity. Mild intrahepatic biliary ductal dilatation is noted and chronic.  IVC:  No abnormality visualized.  Pancreas:  Visualized portion unremarkable.  Spleen:  Size and appearance within normal limits.  Right Kidney:  Length: 10.3 cm. Echogenicity within normal limits. No mass or hydronephrosis visualized.  Left Kidney:  Length: 11.1 cm. Echogenicity within normal limits. No mass or hydronephrosis visualized.  Abdominal aorta:  No aneurysm visualized.  Other findings:  None.  IMPRESSION: No stone is identified in the common bile  duct. 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 No acute abnormality is identified.   Electronically Signed   By: Drusilla Kannerhomas  Dalessio M.D.   On: 04/09/2014 17:11    Scheduled Meds: . albuterol  2.5 mg Nebulization BID  . ALPRAZolam  0.5 mg Oral BID  . aspirin  81 mg Oral Daily  . enoxaparin (LOVENOX) injection  40 mg Subcutaneous Q24H  . pantoprazole (PROTONIX) IV  40 mg Intravenous Q12H  . predniSONE  60 mg Oral QAC breakfast   Continuous Infusions: . 0.9 % NaCl with KCl 20 mEq / L 75 mL/hr at 04/10/14 [id number]    Principal Problem:   Acute pancreatitis Active Problems:   Abdominal pain   Nausea and vomiting in adult   Acute diarrhea   Asthma    Time spent:  30 minutes   Rhetta MuraJai-Gurmukh Dillian Feig  [facility] Pager [phone] 7AM, please contact night-coverage at [email or web address], password [email or web address] 04/10/2014, 9:05 AM  LOS:  3 days

## 2014-04-10 NOTE — Op Note (Signed)
EGD PROCEDURE REPORT  PATIENT:  [name]  MR#:  [id number] Birthdate:  [birth year], [age] y.o., female Endoscopist:  Dr. Malissa HippoNajeeb U. Lolita Faulds, MD Referred By:  Dr. Rhetta MuraJai-Gurmukh Samtani, MD Procedure Date: 04/10/2014  Procedure:   EGD  Indications:  Patient is 38 year old Caucasian female who presents with acute onset of epigastric pain and mildly elevated serum lipase. She was thought to have pancreatitis but ultrasound and abdominopelvic CT unremarkable. Her pain does not improve with supportive therapy. She is undergoing diagnostic EGD.             Informed Consent:  The risks, benefits, alternatives & imponderables which include, but are not limited to, bleeding, infection, perforation, drug reaction and potential missed lesion have been reviewed.  The potential for biopsy, lesion removal, esophageal dilation, etc. have also been discussed.  Questions have been answered.  All parties agreeable.  Please see history & physical in medical record for more information.  Medications:  Demerol 100 mg IV Versed 15 mg IV Promethazine 12.5 mg IV and diluted form. Cetacaine spray topically for oropharyngeal anesthesia  Description of procedure:  The endoscope was introduced through the mouth and advanced to the second portion of the duodenum without difficulty or limitations. The mucosal surfaces were surveyed very carefully during advancement of the scope and upon withdrawal.  Findings:  Esophagus:  Mucosa of the esophagus was normal. GE junction unremarkable. GEJ:  37 cm Stomach:  Stomach was empty other than small amount of bile. It distended very well with insufflation. Folds in the proximal stomach were unremarkable. Examination of mucosa revealed specks of coffee-ground material coating mucosa the antrum and gastric body. There was patchy erythema and prepyloric scar but no active ulcer noted. Pyloric channel was patent. Angularis fundus and cardia did not reveal ulceration or other  abnormalities. Duodenum:  Normal bulbar and post bulbar mucosa.  Therapeutic/Diagnostic Maneuvers Performed:  None  Complications:  None  Impression: Antral gastritis and prepyloric scar. Specks of coffee-ground material noted coating mucosa in antrum and gastric body but no no ulceration or other abnormalities noted.  Recommendations:  Sucralfate 1 g by mouth a.c. and each bedtime. Levsin sublingual one before each meal. H. pylori serology. Serum amylase and lipase in a.m.   Malissa Hippoajeeb U Kapena Hamme  04/10/2014  4:40 PM  CC: Dr. Bonnetta BarryNo primary provider on file. & Dr. No ref. provider found

## 2014-04-10 NOTE — Consult Note (Signed)
Reason for Consult:GERD Referring Physician: Hospitalist  [name] is an 38 y.o. female.  HPI: Admitted thru the ED Sunday with epigastric pain. Noted to have elevated Lipase. Denies prior hx etoh abuse. She has had the pain off and on for a year. This episode she has had the pain x 3 days. There is no acid reflux. Appetite is good. After she eats, she has diarrhea, nausea, and epigastric pain.  There has been no weight loss. She is having a BM daily. Stools are usually firm in nature. No prior hx of PUD. Hx of CVA in 2010   04/07/2014 CT abdomen/pelvis with CM: IMPRESSION:  No acute finding abdomen or pelvis. No finding to explain the  patient's symptoms.  Status post cholecystectomy.   Past Medical History  Diagnosis Date  . Stroke   . Asthma     Past Surgical History  Procedure Laterality Date  . Cholecystectomy    . Bladder surgery      Family History  Problem Relation Age of Onset  . Pancreatic cancer Mother   . Stroke Mother   . Diabetes Father   . Hypertension Father     Social History:  reports that she quit smoking about 9 months ago. She does not have any smokeless tobacco history on file. She reports that she does not drink alcohol or use illicit drugs.  Allergies: No Known Allergies  Medications: I have reviewed the patient's current medications.  Results for orders placed during the hospital encounter of 04/07/14 (from the past 48 hour(s))  LIPASE, BLOOD     Status: None   Collection Time    04/09/14  8:49 AM      Result Value Ref Range   Lipase 15  11 - 59 U/L  OCCULT BLOOD X 1 CARD TO LAB, STOOL     Status: None   Collection Time    04/09/14 11:20 AM      Result Value Ref Range   Fecal Occult Bld NEGATIVE  NEGATIVE  COMPREHENSIVE METABOLIC PANEL     Status: Abnormal   Collection Time    04/10/14  5:23 AM      Result Value Ref Range   Sodium 143  137 - 147 mEq/L   Potassium 4.1  3.7 - 5.3 mEq/L   Chloride 104  96 - 112 mEq/L   CO2 29   19 - 32 mEq/L   Glucose, Bld 133 (*) 70 - 99 mg/dL   BUN 14  6 - 23 mg/dL   Creatinine, Ser 0.73  0.50 - 1.10 mg/dL   Calcium 8.7  8.4 - 10.5 mg/dL   Total Protein 6.7  6.0 - 8.3 g/dL   Albumin 3.6  3.5 - 5.2 g/dL   AST 17  0 - 37 U/L   ALT 13  0 - 35 U/L   Alkaline Phosphatase 68  39 - 117 U/L   Total Bilirubin 0.4  0.3 - 1.2 mg/dL   GFR calc non Af Amer >90  >90 mL/min   GFR calc Af Amer >90  >90 mL/min   Comment: (NOTE)     The eGFR has been calculated using the CKD EPI equation.     This calculation has not been validated in all clinical situations.     eGFR's persistently <90 mL/min signify possible Chronic Kidney     Disease.    US Abdomen Complete  04/09/2014   CLINICAL DATA:  Status post cholecystectomy. Upper abdominal pain. Question common bile  duct stone.  EXAM: ULTRASOUND ABDOMEN COMPLETE  COMPARISON:  CT abdomen and pelvis 04/07/2014, 12/21/2011 and 10/14/2011.  FINDINGS: Gallbladder:  Removed  Common bile duct:  Diameter: Dilated at 1.1 cm. The appearance is not markedly changed. No stone is seen within the duct.  Liver:  No focal lesion identified. Within normal limits in parenchymal echogenicity. Mild intrahepatic biliary ductal dilatation is noted and chronic.  IVC:  No abnormality visualized.  Pancreas:  Visualized portion unremarkable.  Spleen:  Size and appearance within normal limits.  Right Kidney:  Length: 10.3 cm. Echogenicity within normal limits. No mass or hydronephrosis visualized.  Left Kidney:  Length: 11.1 cm. Echogenicity within normal limits. No mass or hydronephrosis visualized.  Abdominal aorta:  No aneurysm visualized.  Other findings:  None.  IMPRESSION: No stone is identified in the common bile duct. 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 No acute abnormality is identified.   Electronically Signed   By: Inge Rise M.D.   On: 04/09/2014 17:11    ROS Blood pressure 97/60, pulse 67, temperature 97.9 F (36.6  C), temperature source Oral, resp. rate 20, height 4' 9"  (1.448 m), weight 125 lb 14.1 oz (57.1 kg), last menstrual period 03/31/2014, SpO2 98.00%. Physical Exam Alert and oriented. Skin warm and dry. Oral mucosa is moist.   . Sclera anicteric, conjunctivae is pink. Thyroid not enlarged. No cervical lymphadenopathy. Lungs clear. Heart regular rate and rhythm.  Abdomen is soft. Bowel sounds are positive. No hepatomegaly. No abdominal masses felt. Epigastric tenderness.  No edema to lower extremities.    Assessment/Plan: Epigastric pain post-prandial with nausea. PUD needs to be ruled out. Dr. Laural Golden in with patient.  EGD today with Dr. Cherylynn Ridges 04/10/2014, 11:42 AM     GI attending note; Patient included examined earlier today. Patient felt to have pancreatitis based on about twice normal serum lipase but her sinus CT negative for changes of pancreatitis. She remains quite tender in midepigastric region. I agree peptic ulcer disease needs to be ruled out. Patient is agreeable to proceeding with EGD later today.

## 2014-04-11 LAB — LIPASE, BLOOD: Lipase: 19 U/L (ref 11–59)

## 2014-04-11 LAB — CBC
HCT: 27.9 % — ABNORMAL LOW (ref 36.0–46.0)
Hemoglobin: 8.7 g/dL — ABNORMAL LOW (ref 12.0–15.0)
MCH: 20.2 pg — AB (ref 26.0–34.0)
MCHC: 31.2 g/dL (ref 30.0–36.0)
MCV: 64.7 fL — AB (ref 78.0–100.0)
PLATELETS: 298 10*3/uL (ref 150–400)
RBC: 4.31 MIL/uL (ref 3.87–5.11)
RDW: 15.4 % (ref 11.5–15.5)
WBC: 9.8 10*3/uL (ref 4.0–10.5)

## 2014-04-11 LAB — AMYLASE: AMYLASE: 27 U/L (ref 0–105)

## 2014-04-11 MED ORDER — DICYCLOMINE HCL 10 MG PO CAPS
10.0000 mg | ORAL_CAPSULE | Freq: Three times a day (TID) | ORAL | Status: DC
  Administered 2014-04-11 – 2014-04-14 (×10): 10 mg via ORAL
  Filled 2014-04-11 (×10): qty 1

## 2014-04-11 MED ORDER — PNEUMOCOCCAL VAC POLYVALENT 25 MCG/0.5ML IJ INJ
0.5000 mL | INJECTION | INTRAMUSCULAR | Status: AC
  Administered 2014-04-12: 0.5 mL via INTRAMUSCULAR
  Filled 2014-04-11: qty 0.5

## 2014-04-11 MED ORDER — ALPRAZOLAM 0.5 MG PO TABS
0.5000 mg | ORAL_TABLET | Freq: Three times a day (TID) | ORAL | Status: DC
  Administered 2014-04-11 – 2014-04-14 (×10): 0.5 mg via ORAL
  Filled 2014-04-11 (×10): qty 1

## 2014-04-11 NOTE — Progress Notes (Signed)
[facility] PROGRESS NOTE  [name] ONG:[id number] DOB: 1976-08-12 DOA: 04/07/2014 PCP: No primary provider on file.  Assessment/Plan: 1. Dyspnea/hypoxia-resolved, possibly anxiety related Transitioned Solu-Medrol to prednisone 60 mg stop date 04/13/49 Echocardiogram EF of 60%, CT angiogram negative for any acute chest pathology 2. Prior CVA 2008-currently stable 3. Abdominal pain, mild pancreatitis-continue full liquid diet, graduate to full regular diet as per GI. Appreciate gastroenterology input-EGD 4/29 showed antral gastritis and prepyloric scar with specks of coffee-ground material. Started on Carafate 1 g a.c. at bedtime and Levsin SL 4 times a day a.c. at bedtime, amylase and lipase are negative. H. pylori workup is pending 4. Microcytic anemia- hemoglobin stable in the 8-9 range likely dilutional effect as well 5. Hypokalemia- potassium replaced, and potassium was 4.1. 6. DVT prophylaxis- Lovenox  Code Status: Full code Family Communication: Discussed with husband at bedside Disposition Plan: Home when ready   Consultants:  None  Procedures:  *None  Antibiotics:  None  HPI/Subjective:  Feels better. Some nausea and needs medication for this Still on clear liquids excellent pain rated as 5/10 No actual vomiting No stool at present Requesting Xanax increase as [facility]  Objective: Filed Vitals:   04/11/14 0630  BP: 105/66  Pulse: 59  Temp: 97.9 F (36.6 C)  Resp: 20    Intake/Output Summary (Last 24 hours) at 04/11/14 0824 Last data filed at 04/11/14 [id number]  Gross per 24 hour  Intake      0 ml  Output   1250 ml  Net  -1250 ml   Filed Weights   04/07/14 1736 04/07/14 2226 04/09/14 0531  Weight: 56.246 kg (124 lb) 58.7 kg (129 lb 6.6 oz) 57.1 kg (125 lb 14.1 oz)    Exam:  Physical Exam: Head: Normocephalic, atraumatic.  Eyes: No signs of jaundice, EOMI Nose: Mucous membranes dry.  Throat:  Oropharynx nonerythematous, no exudate appreciated.  Neck: supple,No deformities, masses, or tenderness noted. Lungs: Decreased breath sounds at lung bases, scattered wheezing Heart: Regular RR. S1 and S2 normal  Abdomen: BS normoactive. Soft, Nondistended, tender epigastric area  Extremities: No pretibial edema, no erythema   Data Reviewed: Basic Metabolic Panel:  Recent Labs Lab 04/07/14 1753 04/08/14 0521 04/10/14 0523  NA 139 142 143  K 2.9* 4.1 4.1  CL 99 105 104  CO2 27 28 29   GLUCOSE 84 89 133*  BUN 10 7 14   CREATININE 0.98 0.91 0.73  CALCIUM 9.1 8.1* 8.7  MG 2.0  --   --    Liver Function Tests:  Recent Labs Lab 04/07/14 1753 04/08/14 0521 04/10/14 0523  AST 39* 26 17  ALT 24 17 13   ALKPHOS 69 61 68  BILITOT 0.5 0.5 0.4  PROT 7.2 6.1 6.7  ALBUMIN 4.1 3.4* 3.6    Recent Labs Lab 04/07/14 1753 04/08/14 0521 04/09/14 0849 04/11/14 0521  LIPASE 123* 23 15 19   AMYLASE  --   --   --  27   No results found for this basename: AMMONIA,  in the last 168 hours CBC:  Recent Labs Lab 04/07/14 1753 04/08/14 0521 04/11/14 0521  WBC 9.0 7.8 9.8  NEUTROABS 3.9  --   --   HGB 10.6* 9.5* 8.7*  HCT 32.7* 29.9* 27.9*  MCV 63.9* 64.3* 64.7*  PLT 359 311 298   Cardiac Enzymes: No results found for this basename: CKTOTAL, CKMB, CKMBINDEX, TROPONINI,  in the last 168 hours BNP (last 3 results)  Recent Labs  04/08/14 0521  PROBNP 67.7   CBG: No results found for this basename: GLUCAP,  in the last 168 hours  Recent Results (from the past 240 hour(s))  CLOSTRIDIUM DIFFICILE BY PCR     Status: None   Collection Time    04/08/14  9:52 AM      Result Value Ref Range Status   C difficile by pcr NEGATIVE  NEGATIVE Final     Studies: [place] Abdomen Complete  04/09/2014   CLINICAL DATA:  Status post cholecystectomy. Upper abdominal pain. Question common bile duct stone.  EXAM: ULTRASOUND ABDOMEN COMPLETE  COMPARISON:  CT abdomen and pelvis 04/07/2014,  12/21/2011 and 10/14/2011.  FINDINGS: Gallbladder:  Removed  Common bile duct:  Diameter: Dilated at 1.1 cm. The appearance is not markedly changed. No stone is seen within the duct.  Liver:  No focal lesion identified. Within normal limits in parenchymal echogenicity. Mild intrahepatic biliary ductal dilatation is noted and chronic.  IVC:  No abnormality visualized.  Pancreas:  Visualized portion unremarkable.  Spleen:  Size and appearance within normal limits.  Right Kidney:  Length: 10.3 cm. Echogenicity within normal limits. No mass or hydronephrosis visualized.  Left Kidney:  Length: 11.1 cm. Echogenicity within normal limits. No mass or hydronephrosis visualized.  Abdominal aorta:  No aneurysm visualized.  Other findings:  None.  IMPRESSION: No stone is identified in the common bile duct. 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 No acute abnormality is identified.   Electronically Signed   By: Drusilla Kannerhomas  Dalessio M.D.   On: 04/09/2014 17:11    Scheduled Meds: . albuterol  2.5 mg Nebulization BID  . ALPRAZolam  0.5 mg Oral BID  . aspirin  81 mg Oral Daily  . enoxaparin (LOVENOX) injection  40 mg Subcutaneous Q24H  . hyoscyamine  0.125 mg Sublingual TID AC  . pantoprazole  40 mg Oral BID  . [START ON 04/12/2014] pneumococcal 23 valent vaccine  0.5 mL Intramuscular Tomorrow-1000  . predniSONE  60 mg Oral QAC breakfast  . sucralfate  1 g Oral TID WC & HS   Continuous Infusions: . 0.9 % NaCl with KCl 20 mEq / L 75 mL/hr at 04/11/14 16100633    Principal Problem:   Acute pancreatitis Active Problems:   Abdominal pain   Nausea and vomiting in adult   Acute diarrhea   Asthma    Time spent:  30 minutes   Rhetta MuraJai-Gurmukh Omari Koslosky  [facility] Pager [phone] 7AM, please contact night-coverage at [email or web address], password [email or web address] 04/11/2014, 8:24 AM  LOS: 4 days

## 2014-04-11 NOTE — Progress Notes (Signed)
Subjective; Patient continues to complain of severe epigastric pain. She states she woke up this morning with intense pain. She continues to complain of nausea. She has poor appetite. Now she states she's had this pain for at least 6 months. She is not sure, to weight she has lost.  Objective; BP 130/78  Pulse 69  Temp(Src) 98.3 F (36.8 C) (Oral)  Resp 20  Ht 4\' 9"  (1.448 m)  Wt 125 lb 14.1 oz (57.1 kg)  BMI 27.23 kg/m2  SpO2 98%  LMP 03/31/2014 Abdomen is symmetrical. Bowel sounds are normal. On palpation abdomen is soft with moderate midepigastric tenderness. No organomegaly or masses. No any edema or clubbing noted.  Lab data; WBC 9.8, H&H 8.7 and 27.9 and platelet count 298K MCV 64.7 Serum amylase 27 Serum lipase 19. H. pylori serology is pending.  Iron studies from 04/08/2014 as follows. Serum iron 77, TIBC 299 and saturation 26% Serum ferritin 26 Folate 5.4 and B12 393.  Assessment; #1. Epigastric pain. Etiology unclear. She had mildly elevated serum lipase on admission but no evidence of pancreatitis an ultrasound or CT. She is complaining of severe pain in which case one would expect to see some changes on CT which is not the case. Similarly gastritis would not explain excruciating pain. I have reviewed abdominopelvic CT again and both celiac trunk and SMA appear to be patent. Biliary system is mildly dilated but transaminases remain normal. Too early to tell if she will respond to sucralfate and anti-spasmodics. #2. Microcytic anemia appears to be chronic.  Recommendations; Discontinue Levsin and begin dicyclomine 10 mg by mouth 3 times a day.

## 2014-04-12 ENCOUNTER — Encounter (HOSPITAL_COMMUNITY): Payer: Self-pay | Admitting: Internal Medicine

## 2014-04-12 DIAGNOSIS — K859 Acute pancreatitis without necrosis or infection, unspecified: Secondary | ICD-10-CM

## 2014-04-12 DIAGNOSIS — R197 Diarrhea, unspecified: Secondary | ICD-10-CM

## 2014-04-12 DIAGNOSIS — D509 Iron deficiency anemia, unspecified: Secondary | ICD-10-CM

## 2014-04-12 DIAGNOSIS — R112 Nausea with vomiting, unspecified: Secondary | ICD-10-CM

## 2014-04-12 DIAGNOSIS — D539 Nutritional anemia, unspecified: Secondary | ICD-10-CM

## 2014-04-12 LAB — CBC
HCT: 32.5 % — ABNORMAL LOW (ref 36.0–46.0)
Hemoglobin: 10.2 g/dL — ABNORMAL LOW (ref 12.0–15.0)
MCH: 20.1 pg — ABNORMAL LOW (ref 26.0–34.0)
MCHC: 31.4 g/dL (ref 30.0–36.0)
MCV: 64.1 fL — AB (ref 78.0–100.0)
Platelets: 350 10*3/uL (ref 150–400)
RBC: 5.07 MIL/uL (ref 3.87–5.11)
RDW: 15.2 % (ref 11.5–15.5)
WBC: 11 10*3/uL — ABNORMAL HIGH (ref 4.0–10.5)

## 2014-04-12 LAB — H. PYLORI ANTIBODY, IGG: H Pylori IgG: <0.4 {ISR}

## 2014-04-12 NOTE — Progress Notes (Signed)
Nutrition Brief Note  RD pulled to chart due to LOS  Wt Readings from Last 15 Encounters:  04/09/14 125 lb 14.1 oz (57.1 kg)  04/09/14 125 lb 14.1 oz (57.1 kg)  02/28/14 124 lb (56.246 kg)  02/22/14 124 lb (56.246 kg)  09/18/13 125 lb (56.7 kg)  12/21/11 114 lb (51.71 kg)  11/23/11 114 lb (51.71 kg)  10/14/11 114 lb (51.71 kg)    Body mass index is 27.23 kg/(m^2). Patient meets criteria for overweight based on current BMI.   Current diet order is full liquid, patient is consuming approximately 50-100% of meals at this time. Labs and medications reviewed.   No nutrition interventions warranted at this time. If nutrition issues arise, please consult RD.   [name], RD, LDN Pager: [phone]

## 2014-04-12 NOTE — Progress Notes (Signed)
PROGRESS NOTE  [name] [id number] DOB: 1976-05-19 DOA: 04/07/2014 PCP: No primary provider on file.  Summary: 38 year old woman presented with upper abdominal pain with elevated lipase and dehydration. She was admitted for upper abdominal pain and elevated lipase level. As well as nausea vomiting and diarrhea. She was seen in consultation with gastroenterology and underwent EGD.  Assessment/Plan: 1. Upper abdominal/epigastric pain. Improving. Etiology unclear. Mild elevated lipase on admission but no clear evidence of pancreatitis on imaging. EGD unrevealing. CT of the abdomen and pelvis unremarkable with widely patent vessels. Biliary system mildly dilated the transaminases normal. Improved with PPI, sucralfate and antispasmodic. 2. Nausea, vomiting, diarrhea. Resolved. Tolerating diet.  3. Acute hypoxic respiratory failure. Thought related to asthma exacerbation. Resolved. CT angiogram of the chest was unremarkable. 4. Macrocytic anemia. Likely related to menses. Followup anemia panel. 5. Chronic abdominal pain. Unclear etiology. 6. Possible mild asthma exacerbation. Resolved.   Overall improving. Discussed with gastroenterologist Dr. Karilyn Cotaehman. Continue current medications. Monitor overnight. May be able to go home 5/2. If symptoms relapse consider CT angiogram of the abdomen and EUS.  D/C IV pain medicine and monitor.  Code Status: full code DVT prophylaxis: Lovenox Family Communication: none present Disposition Plan: home  Brendia Sacksaniel Goodrich, MD  [facility]  Pager [phone] If 7PM-7AM, please contact night-coverage at [email or web address], password [email or web address] 04/12/2014, 4:36 PM  LOS: 5 days   Consultants:  Gastroenterology   Procedures:  EGD: Antral gastritis with prepyloric scar.    2-D echocardiogram: Left ventricular ejection fraction 55-60%. Normal wall motion.  HPI/Subjective: Feels better today. Tolerating diet. Abdominal pain is better today, long-standing for  approximately 5 months.  Objective: Filed Vitals:   04/11/14 1925 04/11/14 2301 04/12/14 0454 04/12/14 1459  BP:  109/73 117/76 124/79  Pulse:  69 65 67  Temp:  97.7 F (36.5 C) 97.8 F (36.6 C) 98 F (36.7 C)  TempSrc:  Oral Oral Oral  Resp:  20 20 18   Height:      Weight:      SpO2: 98% 97% 99% 96%    Intake/Output Summary (Last 24 hours) at 04/12/14 1636 Last data filed at 04/12/14 1300  Gross per 24 hour  Intake    480 ml  Output      0 ml  Net    480 ml     Filed Weights   04/07/14 1736 04/07/14 2226 04/09/14 0531  Weight: 56.246 kg (124 lb) 58.7 kg (129 lb 6.6 oz) 57.1 kg (125 lb 14.1 oz)    Exam:   Afebrile, vital signs are stable. No hypoxia.  Gen. Appears calm and comfortable. Speech fluent and clear.  Cardiovascular. Regular rate and rhythm. No murmur, rub or gallop.  Respiratory clear to auscultation bilaterally. No wheezes, rales or rhonchi. Normal respiratory effort.  Abdomen soft nontender nondistended.  Data Reviewed:  Hemoglobin stable, 10.2  Scheduled Meds: . ALPRAZolam  0.5 mg Oral TID  . aspirin  81 mg Oral Daily  . dicyclomine  10 mg Oral TID AC  . enoxaparin (LOVENOX) injection  40 mg Subcutaneous Q24H  . pantoprazole  40 mg Oral BID  . predniSONE  60 mg Oral QAC breakfast  . sucralfate  1 g Oral TID WC & HS   Continuous Infusions:   Principal Problem:   Acute pancreatitis Active Problems:   Abdominal pain   Nausea and vomiting in adult   Acute diarrhea   Asthma   Macrocytic anemia   Time spent 25 minutes

## 2014-04-12 NOTE — Progress Notes (Signed)
Subjective; Patient continues to complain of postprandial epigastric pain and nausea. She states pain has definitely decreased in the last 24 hours. Appetite is poor but she did eat some of her breakfast and lunch and it stayed down. She denies melena or rectal bleeding.  Objective; BP 124/79  Pulse 67  Temp(Src) 98 F (36.7 C) (Oral)  Resp 18  Ht 4\' 9"  (1.448 m)  Wt 125 lb 14.1 oz (57.1 kg)  BMI 27.23 kg/m2  SpO2 96%  LMP 03/31/2014 Patient is alert and in no acute distress. Abdomen is soft with mild to moderate midepigastric tenderness. No organomegaly or masses.  Lab data; H. pylori serology is negative.  WBC 11.0, H&H 10.2 and 32.5 and MCV 64.1. Platelet count 350K.  Assessment; #1. Epigastric pain associated with nausea. Patient had mildly elevated serum lipase on admission but no other criteria to support diagnosis of pancreatitis. EGD negative for peptic ulcer disease. She has mildly dilated biliary system but transaminases are normal and this showed what appeared to be post cholecystectomy. Similarly nothing to suggest abdominal angina. She is definitely better with combination of PPI, sucralfate and anti-spasmodic. #2. Microcytic anemia of chronic disease. No evidence of overt GI bleed.  Recommendations; Continue present GI medications. If symptoms relapse will consider CT angiogram abdomen and EUS.

## 2014-04-13 MED ORDER — POLYETHYLENE GLYCOL 3350 17 G PO PACK
17.0000 g | PACK | Freq: Two times a day (BID) | ORAL | Status: DC
  Administered 2014-04-13 – 2014-04-14 (×2): 17 g via ORAL
  Filled 2014-04-13 (×2): qty 1

## 2014-04-13 MED ORDER — SENNA 8.6 MG PO TABS
1.0000 | ORAL_TABLET | Freq: Every day | ORAL | Status: DC
  Administered 2014-04-13 – 2014-04-14 (×2): 8.6 mg via ORAL
  Filled 2014-04-13 (×2): qty 1

## 2014-04-13 MED ORDER — DOCUSATE SODIUM 100 MG PO CAPS
100.0000 mg | ORAL_CAPSULE | Freq: Two times a day (BID) | ORAL | Status: DC
  Administered 2014-04-13 – 2014-04-14 (×2): 100 mg via ORAL
  Filled 2014-04-13 (×2): qty 1

## 2014-04-13 NOTE — Progress Notes (Signed)
PROGRESS NOTE  [name] ZOX:[id number] DOB: [birth year] DOA: 04/07/2014 PCP: No primary provider on file.  Summary: 38 year old woman presented with upper abdominal pain with elevated lipase and dehydration. She was admitted for upper abdominal pain and elevated lipase level. As well as nausea vomiting and diarrhea. She was seen in consultation with gastroenterology and underwent EGD.  Assessment/Plan: 1. Upper abdominal/epigastric pain. Improving. Etiology unclear. Mild elevated lipase on admission but no clear evidence of pancreatitis on imaging. EGD unrevealing. CT of the abdomen and pelvis unremarkable with widely patent vessels. Biliary system mildly dilated the transaminases normal. Improved with PPI, sucralfate and antispasmodic. 2. Nausea, vomiting, diarrhea. Resolved. Tolerating diet.  3. Acute hypoxic respiratory failure. Thought related to asthma exacerbation. Resolved. CT angiogram of the chest was unremarkable. 4. Macrocytic anemia. Likely related to menses. Followup anemia panel. 5. Chronic abdominal pain. Unclear etiology. 6. Possible mild asthma exacerbation. Resolved.   Overall improving. Discussed with gastroenterologist Dr. Karilyn Cotaehman again 5/2. Plan advance diet, continue current medications, repeat laboratory studies in the morning. May be able to go home 5/3. If symptoms relapse consider CT angiogram of the abdomen and EUS. Bowel regimen.  Code Status: full code DVT prophylaxis: Lovenox Family Communication: none present Disposition Plan: home  Brendia Sacksaniel Goodrich, MD  [facility]  Pager [phone] If 7PM-7AM, please contact night-coverage at [email or web address], password [email or web address] 04/13/2014, 4:52 PM  LOS: 6 days   Consultants:  Gastroenterology   Procedures:  EGD: Antral gastritis with prepyloric scar.    2-D echocardiogram: Left ventricular ejection fraction 55-60%. Normal wall motion.  HPI/Subjective: Continues to feel little bit better. Tolerating liquids.  Abdominal pain somewhat better.  Objective: Filed Vitals:   04/12/14 0454 04/12/14 1459 04/12/14 2025 04/13/14 0414  BP: 117/76 124/79 107/69 106/71  Pulse: 65 67 69 62  Temp: 97.8 F (36.6 C) 98 F (36.7 C) 97.8 F (36.6 C) 97.9 F (36.6 C)  TempSrc: Oral Oral Oral Oral  Resp: 20 18 18 18   Height:      Weight:      SpO2: 99% 96% 100% 98%    Intake/Output Summary (Last 24 hours) at 04/13/14 1652 Last data filed at 04/13/14 0616  Gross per 24 hour  Intake   1200 ml  Output    500 ml  Net    700 ml     Filed Weights   04/07/14 1736 04/07/14 2226 04/09/14 0531  Weight: 56.246 kg (124 lb) 58.7 kg (129 lb 6.6 oz) 57.1 kg (125 lb 14.1 oz)    Exam:   Afebrile, vital signs are stable. No hypoxia.  Gen. Appears calm, comfortable.  Respiratory. Clear to auscultation bilaterally. No wheezes, rales or rhonchi. Normal respiratory effort.  Cardiovascular. Regular rate and rhythm. No murmur, rub or gallop.   Abdomen. Soft, mild epigastric tenderness. Nondistended.  Data Reviewed:    Scheduled Meds: . ALPRAZolam  0.5 mg Oral TID  . aspirin  81 mg Oral Daily  . dicyclomine  10 mg Oral TID AC  . enoxaparin (LOVENOX) injection  40 mg Subcutaneous Q24H  . pantoprazole  40 mg Oral BID  . predniSONE  60 mg Oral QAC breakfast  . sucralfate  1 g Oral TID WC & HS   Continuous Infusions:   Principal Problem:   Acute pancreatitis Active Problems:   Abdominal pain   Nausea and vomiting in adult   Acute diarrhea   Asthma   Macrocytic anemia   Time spent 15 minutes

## 2014-04-13 NOTE — Progress Notes (Signed)
Subjective; Patient reports that she is feeling better than she did yesterday. She is still having postprandial pain but not as bad. She was able to be potato soup at lunch and did not experience vomiting or excruciating pain. She has taken pain pills 4 times since midnight. Her pain is located mainly in midepigastric region. She denies melena or rectal bleeding. She has not had a bowel movement since admission.  Objective; BP 106/71  Pulse 62  Temp(Src) 97.9 F (36.6 C) (Oral)  Resp 18  Ht 4\' 9"  (1.448 m)  Wt 125 lb 14.1 oz (57.1 kg)  BMI 27.23 kg/m2  SpO2 98%  LMP 03/31/2014 Patient is alert and in no acute distress. She has been ambulating in the hall. Abdomen is soft with mild to moderate midepigastric tenderness. No organomegaly or masses.   Assessment; #1. Epigastric pain associated with nausea. Continued gradual symptomatic improvement with PPI sucralfate and anticholinergic. EGD revealed gastritis but H. pylori serology is negative. She had twice normal serum lipase on admission but subsequently has been normal. No ultrasound or CT evidence of pancreatitis. Mild biliary dilation felt to be normal for person was at cholecystectomy. #2. Microcytic anemia of chronic disease. No evidence of overt GI bleed.  Recommendations; Advance diet to heart healthy. LFTs, serum amylase and lipase in a.m. If symptoms relapse will consider CT angiogram abdomen and EUS.

## 2014-04-14 LAB — HEPATIC FUNCTION PANEL
ALT: 10 U/L (ref 0–35)
AST: 11 U/L (ref 0–37)
Albumin: 3.3 g/dL — ABNORMAL LOW (ref 3.5–5.2)
Alkaline Phosphatase: 51 U/L (ref 39–117)
Total Bilirubin: 0.3 mg/dL (ref 0.3–1.2)
Total Protein: 6.2 g/dL (ref 6.0–8.3)

## 2014-04-14 LAB — LIPASE, BLOOD: LIPASE: 26 U/L (ref 11–59)

## 2014-04-14 LAB — AMYLASE: AMYLASE: 34 U/L (ref 0–105)

## 2014-04-14 MED ORDER — LORAZEPAM 0.5 MG PO TABS: 0.5000 mg | ORAL_TABLET | Freq: Two times a day (BID) | ORAL | Status: DC | PRN

## 2014-04-14 MED ORDER — ONDANSETRON HCL 4 MG PO TABS: 4.0000 mg | ORAL_TABLET | Freq: Four times a day (QID) | ORAL | Status: DC | PRN

## 2014-04-14 MED ORDER — HYDROCODONE-ACETAMINOPHEN 5-325 MG PO TABS
1.0000 | ORAL_TABLET | Freq: Four times a day (QID) | ORAL | Status: DC | PRN
Start: 2014-04-14 — End: 2014-06-04

## 2014-04-14 MED ORDER — SUCRALFATE 1 G PO TABS
1.0000 g | ORAL_TABLET | Freq: Three times a day (TID) | ORAL | Status: DC
Start: 2014-04-14 — End: 2014-08-26

## 2014-04-14 MED ORDER — OMEPRAZOLE 20 MG PO CPDR: 20.0000 mg | DELAYED_RELEASE_CAPSULE | Freq: Two times a day (BID) | ORAL | Status: DC

## 2014-04-14 MED ORDER — DICYCLOMINE HCL 10 MG PO CAPS: 10.0000 mg | ORAL_CAPSULE | Freq: Three times a day (TID) | ORAL | Status: DC

## 2014-04-14 NOTE — Progress Notes (Signed)
Subjective; Patient states she is feeling better than yesterday. She was able to eat some meat without flareup of her pain. She describes her pain to be mild. She denies nausea vomiting or diarrhea.  Objective; BP 113/70  Pulse 68  Temp(Src) 98 F (36.7 C) (Oral)  Resp 18  Ht 4\' 9"  (1.448 m)  Wt 125 lb 14.1 oz (57.1 kg)  BMI 27.23 kg/m2  SpO2 99%  LMP 03/31/2014 Abdomen is soft with mild midepigastric tenderness. No organomegaly or masses.  Lab data;  Bilirubin 0.3, AP 51, AST 11, ALT 10, total protein 6.2 albumin 3.3. Serum amylase 34 and lipase 26.  Assessment; Epigastric pain without a definite etiology. Since she is improving with therapy she could have due to no gastric bile reflux or pain secondary to gastritis. As noted previously if pain recurs she will need further workup consisting of CT angiogram abdomen and US. Believe she is ready for discharge. Discussed with Dr. Irene LimboGoodrich.

## 2014-04-14 NOTE — Progress Notes (Signed)
PROGRESS NOTE  [name] [id number] DOB: [birth year] DOA: 04/07/2014 PCP: No primary provider on file.  Summary: 38 year old woman presented with upper abdominal pain with elevated lipase and dehydration. She was admitted for upper abdominal pain and elevated lipase level. As well as nausea vomiting and diarrhea. She was seen in consultation with gastroenterology and underwent EGD.  Assessment/Plan: 1. Upper abdominal/epigastric pain. Improving. Etiology unclear. Mild elevated lipase on admission but no clear evidence of pancreatitis on imaging. EGD unrevealing. CT of the abdomen and pelvis unremarkable with widely patent vessels. Biliary system mildly dilated the transaminases normal. Improved with PPI, sucralfate and antispasmodic. 2. Nausea, vomiting, diarrhea. Resolved. Tolerating diet.  3. Acute hypoxic respiratory failure. Thought related to asthma exacerbation. Resolved. CT angiogram of the chest was unremarkable. 4. Microcytic anemia. Likely related to menses. Followup anemia panel. 5. Chronic abdominal pain. Unclear etiology. Stable. 6. Possible mild asthma exacerbation. Resolved.   Home today, continue PPI, Bentyl, Carafate  If symptoms relapse consider CT angiogram of the abdomen and EUS. Bowel regimen.  Code Status: full code DVT prophylaxis: Lovenox Family Communication: none present Disposition Plan: home  Brendia Sacksaniel Goodrich, MD  [facility]  Pager [phone] If 7PM-7AM, please contact night-coverage at [email or web address], password [email or web address] 04/14/2014, 2:47 PM  LOS: 7 days   Consultants:  Gastroenterology   Procedures:  EGD: Antral gastritis with prepyloric scar.    2-D echocardiogram: Left ventricular ejection fraction 55-60%. Normal wall motion.  HPI/Subjective: Feeling better. Tolerating solid diet. Pain much improved. Ambulating without difficulty. Wants to go home. Reports her mother is in the hospital. This is causing her a great of  stress.  Objective: Filed Vitals:   04/13/14 0414 04/13/14 2006 04/14/14 0424 04/14/14 1249  BP: 106/71 109/68 113/70 111/74  Pulse: 62 74 68 70  Temp: 97.9 F (36.6 C) 98.1 F (36.7 C) 98 F (36.7 C) 98.4 F (36.9 C)  TempSrc: Oral Oral Oral Oral  Resp: 18 18 18 18   Height:      Weight:      SpO2: 98% 99% 99% 98%    Intake/Output Summary (Last 24 hours) at 04/14/14 1447 Last data filed at 04/14/14 1248  Gross per 24 hour  Intake   1560 ml  Output    400 ml  Net   1160 ml     Filed Weights   04/07/14 1736 04/07/14 2226 04/09/14 0531  Weight: 56.246 kg (124 lb) 58.7 kg (129 lb 6.6 oz) 57.1 kg (125 lb 14.1 oz)    Exam:   Afebrile, vital signs are stable. No hypoxia.  Gen. Appears calm and comfortable. Speech fluent and clear. Observed ambulating in the hall earlier.  Cardiovascular. Regular rate and rhythm. No murmur, rub or gallop. No lower extremity edema.  Respiratory very clear to auscultation bilaterally. No wheezes, rales or rhonchi. Normal respiratory effort.  Abdomen soft nondistended. Mild epigastric tenderness.  Psychiatric. Grossly normal mood and affect. Speech fluent and appropriate.  Data Reviewed:    Scheduled Meds: . ALPRAZolam  0.5 mg Oral TID  . aspirin  81 mg Oral Daily  . dicyclomine  10 mg Oral TID AC  . docusate sodium  100 mg Oral BID  . enoxaparin (LOVENOX) injection  40 mg Subcutaneous Q24H  . pantoprazole  40 mg Oral BID  . polyethylene glycol  17 g Oral BID  . senna  1 tablet Oral Daily  . sucralfate  1 g Oral TID WC & HS   Continuous Infusions:   Principal Problem:  Acute pancreatitis Active Problems:   Abdominal pain   Nausea and vomiting in adult   Acute diarrhea   Asthma   Macrocytic anemia

## 2014-04-14 NOTE — Progress Notes (Signed)
Patient being d/c home with instructions. Verbalizes understanding. IV cath removed and intact. No pain/swelling at site. Prescriptions also given to patient per MD. Family a bedside.

## 2014-04-14 NOTE — Discharge Summary (Signed)
Physician Discharge Summary  [name] ZOX:[id number] DOB: [birth year] DOA: 04/07/2014  PCP: No primary provider on file. Patient encouraged to establish with primary care physician of her choice. GI: Dr. Karilyn Cota  Admit date: 04/07/2014 Discharge date: 04/14/2014  Recommendations for Outpatient Follow-up:  1. Followup epigastric pain. If symptoms relapse consider CT angiogram of the abdomen and EUS. 2. Followup microcytic anemia, consider repeat CBC as an outpatient. 3. Chronic abdominal pain.   Follow-up Information   Follow up with REHMAN,NAJEEB U, MD. Schedule an appointment as soon as possible for a visit in 2 weeks.   Specialty:  Gastroenterology   Contact information:   [address], [address] [place] [address] [phone]        Discharge Diagnoses:  1. Upper abdominal/epigastric pain of unclear etiology 2. Nausea, vomiting, diarrhea 3. Possible acute hypoxic respiratory failure 4. Microcytic anemia 5. Chronic abdominal pain 6. Possible mild asthma exacerbation   Discharge Condition: Improved Disposition: Home  Diet recommendation: Regular  Filed Weights   04/07/14 1736 04/07/14 2226 04/09/14 0531  Weight: 56.246 kg (124 lb) 58.7 kg (129 lb 6.6 oz) 57.1 kg (125 lb 14.1 oz)    History of present illness:  38 year old woman presented with upper abdominal pain with elevated lipase and dehydration. She was admitted for upper abdominal pain and elevated lipase level. As well as nausea vomiting and diarrhea.   Hospital Course:  She treated with supportive care was seen in consultation with gastroenterology and underwent EGD which was unrevealing. Extensive workup included CT of the abdomen and pelvis and ultrasound as well as laboratory studies which did not reveal acute pathology. Imaging did not suggest any narrowing of the abdominal vessels. With Bentyl, PPI and Carafate symptoms gradually improved and she is now clear for discharge as discussed with Dr.  Karilyn Cota. Other issues as below.  1. Upper abdominal/epigastric pain. Improving. Etiology unclear. Mild elevated lipase on admission but no clear evidence of pancreatitis on imaging. EGD unrevealing. CT of the abdomen and pelvis unremarkable with widely patent vessels. Biliary system mildly dilated the transaminases normal. Improved with PPI, sucralfate and antispasmodic. 2. Nausea, vomiting, diarrhea. Resolved. Tolerating diet.  3. Acute hypoxic respiratory failure. Thought related to asthma exacerbation. Resolved. CT angiogram of the chest was unremarkable. 4. Microcytic anemia. Likely related to menses.  5. Chronic abdominal pain. Unclear etiology. Stable. 6. Possible mild asthma exacerbation. Resolved.  Consultants:  Gastroenterology  Procedures:  EGD: Antral gastritis with prepyloric scar.  2-D echocardiogram: Left ventricular ejection fraction 55-60%. Normal wall motion.  Discharge Instructions     Medication List    STOP taking these medications       acetaminophen 500 MG tablet  Commonly known as:  TYLENOL     ranitidine 150 MG tablet  Commonly known as:  ZANTAC      TAKE these medications       albuterol 108 (90 BASE) MCG/ACT inhaler  Commonly known as:  PROVENTIL HFA;VENTOLIN HFA  Inhale 2 puffs into the lungs every 6 (six) hours as needed for wheezing or shortness of breath.     aspirin 81 MG chewable tablet  Chew 81 mg by mouth daily.     dicyclomine 10 MG capsule  Commonly known as:  BENTYL  Take 1 capsule (10 mg total) by mouth 3 (three) times daily before meals.     HYDROcodone-acetaminophen 5-325 MG per tablet  Commonly known as:  NORCO/VICODIN  Take 1 tablet by mouth every 6 (six) hours as needed  for moderate pain.     LORazepam 0.5 MG tablet  Commonly known as:  ATIVAN  Take 1 tablet (0.5 mg total) by mouth 2 (two) times daily as needed for anxiety.     omeprazole 20 MG capsule  Commonly known as:  PRILOSEC  Take 1 capsule (20 mg total) by mouth 2  (two) times daily before a meal.     ondansetron 4 MG tablet  Commonly known as:  ZOFRAN  Take 1 tablet (4 mg total) by mouth every 6 (six) hours as needed for nausea.     sucralfate 1 G tablet  Commonly known as:  CARAFATE  Take 1 tablet (1 g total) by mouth 4 (four) times daily -  with meals and at bedtime.       No Known Allergies  The results of significant diagnostics from this hospitalization (including imaging, microbiology, ancillary and laboratory) are listed below for reference.    Significant Diagnostic Studies: Ct Angio Chest Pe W/cm &/or Wo Cm  04/08/2014   CLINICAL DATA:  Cough, question pulmonary embolism, upper mid abdominal pain, personal history of stroke, asthma, pancreatitis  EXAM: CT ANGIOGRAPHY CHEST WITH CONTRAST  TECHNIQUE: Multidetector CT imaging of the chest was performed using the standard protocol during bolus administration of intravenous contrast. Multiplanar CT image reconstructions and MIPs were obtained to evaluate the vascular anatomy.  CONTRAST:  OMNIPAQUE IOHEXOL 350 MG/ML SOLN  COMPARISON:  None  FINDINGS: Aorta normal caliber without aneurysm or dissection.  No thoracic adenopathy.  Visualized upper abdomen significant only for prior cholecystectomy.  Pulmonary arteries well opacified and patent.  No evidence of pulmonary embolism.  Lungs clear.  No pleural effusion or pneumothorax.  Osseous structures normal appearance.  Review of the MIP images confirms the above findings.  IMPRESSION: Normal CTA chest.   Electronically Signed   By: Ulyses Southward M.D.   On: 04/08/2014 09:43   US Abdomen Complete  04/09/2014   CLINICAL DATA:  Status post cholecystectomy. Upper abdominal pain. Question common bile duct stone.  EXAM: ULTRASOUND ABDOMEN COMPLETE  COMPARISON:  CT abdomen and pelvis 04/07/2014, 12/21/2011 and 10/14/2011.  FINDINGS: Gallbladder:  Removed  Common bile duct:  Diameter: Dilated at 1.1 cm. The appearance is not markedly changed. No stone is seen  within the duct.  Liver:  No focal lesion identified. Within normal limits in parenchymal echogenicity. Mild intrahepatic biliary ductal dilatation is noted and chronic.  IVC:  No abnormality visualized.  Pancreas:  Visualized portion unremarkable.  Spleen:  Size and appearance within normal limits.  Right Kidney:  Length: 10.3 cm. Echogenicity within normal limits. No mass or hydronephrosis visualized.  Left Kidney:  Length: 11.1 cm. Echogenicity within normal limits. No mass or hydronephrosis visualized.  Abdominal aorta:  No aneurysm visualized.  Other findings:  None.  IMPRESSION: No stone is identified in the common bile duct. 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 No acute abnormality is identified.   Electronically Signed   By: Drusilla Kanner M.D.   On: 04/09/2014 17:11   Ct Abdomen Pelvis W Contrast  04/07/2014   CLINICAL DATA:  Abdominal pain.  EXAM: CT ABDOMEN AND PELVIS WITH CONTRAST  TECHNIQUE: Multidetector CT imaging of the abdomen and pelvis was performed using the standard protocol following bolus administration of intravenous contrast.  CONTRAST:  50 mL OMNIPAQUE IOHEXOL 300 MG/ML SOLN, OMNIPAQUE IOHEXOL 300 MG/ML SOLN  COMPARISON:  CT abdomen and pelvis 02/28/2014 and  12/21/2011.  FINDINGS: The lung bases are clear.  No pleural or pericardial effusion.  The patient is status post cholecystectomy. Intra and extrahepatic biliary ductal dilatation seen on prior examinations is stable. No focal liver lesion is identified. The adrenal glands, spleen, pancreas and kidneys appear normal. Uterus, adnexa and urinary bladder are unremarkable. Stomach and small and large bowel appear normal. The appendix is well seen and normal in appearance. There is no lymphadenopathy or fluid. No focal bony abnormality is identified.  IMPRESSION: No acute finding abdomen or pelvis. No finding to explain the patient's symptoms.  Status post cholecystectomy.   Electronically  Signed   By: Drusilla Kannerhomas  Dalessio M.D.   On: 04/07/2014 20:37   Dg Abd Acute W/chest  04/07/2014   CLINICAL DATA:  Abdominal pain with nausea vomiting and diarrhea for the preceding 5 days  EXAM: ACUTE ABDOMEN SERIES (ABDOMEN 2 VIEW & CHEST 1 VIEW)  COMPARISON:  DG CHEST 1V PORT dated 03/04/2014; CT ABD - PELV W/ CM dated 02/28/2014  FINDINGS: The lungs are well-expanded and clear. The cardiopericardial silhouette is normal in size. The mediastinum is normal in width. There is no pleural effusion. The observed portions of the bony thorax appear normal.  Within the abdomen there is a moderate stool burden within the colon. There is no evidence of a small or large bowel obstruction or ileus. There are surgical clips in the gallbladder fossa. There are calcifications within the pelvis likely reflecting phleboliths. No abnormal calcifications project over either kidney. The lumbar spine and bony pelvis appear normal where visualized.  IMPRESSION: 1. The bowel gas pattern may reflect clinical constipation. There is no evidence of ileus, obstruction, or perforation of the bowel. 2. There is no evidence of active cardiopulmonary disease.   Electronically Signed   By: David  SwazilandJordan   On: 04/07/2014 18:48    Microbiology: Recent Results (from the past 240 hour(s))  CLOSTRIDIUM DIFFICILE BY PCR     Status: None   Collection Time    04/08/14  9:52 AM      Result Value Ref Range Status   C difficile by pcr NEGATIVE  NEGATIVE Final     Labs: Basic Metabolic Panel:  Recent Labs Lab 04/07/14 1753 04/08/14 0521 04/10/14 0523  NA 139 142 143  K 2.9* 4.1 4.1  CL 99 105 104  CO2 27 28 29   GLUCOSE 84 89 133*  BUN 10 7 14   CREATININE 0.98 0.91 0.73  CALCIUM 9.1 8.1* 8.7  MG 2.0  --   --    Liver Function Tests:  Recent Labs Lab 04/07/14 1753 04/08/14 0521 04/10/14 0523 04/14/14 0555  AST 39* 26 17 11   ALT 24 17 13 10   ALKPHOS 69 61 68 51  BILITOT 0.5 0.5 0.4 0.3  PROT 7.2 6.1 6.7 6.2  ALBUMIN 4.1  3.4* 3.6 3.3*    Recent Labs Lab 04/07/14 1753 04/08/14 0521 04/09/14 0849 04/11/14 0521 04/14/14 0555  LIPASE 123* 23 15 19 26   AMYLASE  --   --   --  27 34   CBC:  Recent Labs Lab 04/07/14 1753 04/08/14 0521 04/11/14 0521 04/12/14 0508  WBC 9.0 7.8 9.8 11.0*  NEUTROABS 3.9  --   --   --   HGB 10.6* 9.5* 8.7* 10.2*  HCT 32.7* 29.9* 27.9* 32.5*  MCV 63.9* 64.3* 64.7* 64.1*  PLT 359 311 298 350     Recent Labs  04/08/14 0521  PROBNP 67.7    Principal Problem:  Acute pancreatitis Active Problems:   Abdominal pain   Nausea and vomiting in adult   Acute diarrhea   Asthma   Macrocytic anemia   Time coordinating discharge: 35 minutes  Signed:  Brendia Sacksaniel Vincentina Sollers, MD [facility] 04/14/2014, 3:17 PM

## 2014-06-04 ENCOUNTER — Encounter (HOSPITAL_COMMUNITY): Payer: Self-pay | Admitting: Emergency Medicine

## 2014-06-04 ENCOUNTER — Emergency Department (HOSPITAL_COMMUNITY): Payer: Self-pay

## 2014-06-04 ENCOUNTER — Emergency Department (HOSPITAL_COMMUNITY)
Admission: EM | Admit: 2014-06-04 | Discharge: 2014-06-04 | Disposition: A | Discharge status: Home / Self Care | Payer: Self-pay | Attending: Emergency Medicine | Admitting: Emergency Medicine

## 2014-06-04 DIAGNOSIS — Z7982 Long term (current) use of aspirin: Secondary | ICD-10-CM | POA: Insufficient documentation

## 2014-06-04 DIAGNOSIS — R1013 Epigastric pain: Secondary | ICD-10-CM

## 2014-06-04 DIAGNOSIS — Z9889 Other specified postprocedural states: Secondary | ICD-10-CM | POA: Insufficient documentation

## 2014-06-04 DIAGNOSIS — R1110 Vomiting, unspecified: Secondary | ICD-10-CM | POA: Insufficient documentation

## 2014-06-04 DIAGNOSIS — Z8719 Personal history of other diseases of the digestive system: Secondary | ICD-10-CM | POA: Insufficient documentation

## 2014-06-04 DIAGNOSIS — Z87891 Personal history of nicotine dependence: Secondary | ICD-10-CM | POA: Insufficient documentation

## 2014-06-04 DIAGNOSIS — Z79899 Other long term (current) drug therapy: Secondary | ICD-10-CM | POA: Insufficient documentation

## 2014-06-04 DIAGNOSIS — Z3202 Encounter for pregnancy test, result negative: Secondary | ICD-10-CM | POA: Insufficient documentation

## 2014-06-04 DIAGNOSIS — Z8673 Personal history of transient ischemic attack (TIA), and cerebral infarction without residual deficits: Secondary | ICD-10-CM | POA: Insufficient documentation

## 2014-06-04 DIAGNOSIS — J45909 Unspecified asthma, uncomplicated: Secondary | ICD-10-CM | POA: Insufficient documentation

## 2014-06-04 LAB — CBC WITH DIFFERENTIAL/PLATELET
BASOS PCT: 1 % (ref 0–1)
Basophils Absolute: 0.1 10*3/uL (ref 0.0–0.1)
Eosinophils Absolute: 0.3 10*3/uL (ref 0.0–0.7)
Eosinophils Relative: 3 % (ref 0–5)
HEMATOCRIT: 34.7 % — AB (ref 36.0–46.0)
HEMOGLOBIN: 10.9 g/dL — AB (ref 12.0–15.0)
LYMPHS PCT: 25 % (ref 12–46)
Lymphs Abs: 2.1 10*3/uL (ref 0.7–4.0)
MCH: 20.4 pg — ABNORMAL LOW (ref 26.0–34.0)
MCHC: 31.4 g/dL (ref 30.0–36.0)
MCV: 64.9 fL — ABNORMAL LOW (ref 78.0–100.0)
MONOS PCT: 6 % (ref 3–12)
Monocytes Absolute: 0.5 10*3/uL (ref 0.1–1.0)
NEUTROS PCT: 65 % (ref 43–77)
Neutro Abs: 5.5 10*3/uL (ref 1.7–7.7)
Platelets: 344 10*3/uL (ref 150–400)
RBC: 5.35 MIL/uL — AB (ref 3.87–5.11)
RDW: 14.4 % (ref 11.5–15.5)
WBC: 8.5 10*3/uL (ref 4.0–10.5)

## 2014-06-04 LAB — URINALYSIS, ROUTINE W REFLEX MICROSCOPIC
BILIRUBIN URINE: NEGATIVE
Glucose, UA: NEGATIVE mg/dL
KETONES UR: NEGATIVE mg/dL
Leukocytes, UA: NEGATIVE
Nitrite: NEGATIVE
PH: 7.5 (ref 5.0–8.0)
Protein, ur: NEGATIVE mg/dL
Specific Gravity, Urine: 1.01 (ref 1.005–1.030)
UROBILINOGEN UA: 0.2 mg/dL (ref 0.0–1.0)

## 2014-06-04 LAB — COMPREHENSIVE METABOLIC PANEL
ALK PHOS: 74 U/L (ref 39–117)
ALT: 12 U/L (ref 0–35)
AST: 16 U/L (ref 0–37)
Albumin: 4.2 g/dL (ref 3.5–5.2)
BILIRUBIN TOTAL: 0.2 mg/dL — AB (ref 0.3–1.2)
BUN: 11 mg/dL (ref 6–23)
CHLORIDE: 99 meq/L (ref 96–112)
CO2: 29 mEq/L (ref 19–32)
Calcium: 9.7 mg/dL (ref 8.4–10.5)
Creatinine, Ser: 0.7 mg/dL (ref 0.50–1.10)
GFR calc non Af Amer: >90 mL/min (ref >90)
GLUCOSE: 107 mg/dL — AB (ref 70–99)
POTASSIUM: 3.5 meq/L — AB (ref 3.7–5.3)
Sodium: 139 mEq/L (ref 137–147)
TOTAL PROTEIN: 7.4 g/dL (ref 6.0–8.3)

## 2014-06-04 LAB — URINE MICROSCOPIC-ADD ON

## 2014-06-04 LAB — PREGNANCY, URINE: Preg Test, Ur: NEGATIVE

## 2014-06-04 LAB — LIPASE, BLOOD: Lipase: 59 U/L (ref 11–59)

## 2014-06-04 MED ORDER — SODIUM CHLORIDE 0.9 % IV BOLUS (SEPSIS)
1000.0000 mL | Freq: Once | INTRAVENOUS | Status: AC
  Administered 2014-06-04: 1000 mL via INTRAVENOUS

## 2014-06-04 MED ORDER — PROMETHAZINE HCL 25 MG PO TABS: 25.0000 mg | ORAL_TABLET | Freq: Four times a day (QID) | ORAL | Status: DC | PRN

## 2014-06-04 MED ORDER — PANTOPRAZOLE SODIUM 40 MG IV SOLR
40.0000 mg | Freq: Once | INTRAVENOUS | Status: AC
  Administered 2014-06-04: 40 mg via INTRAVENOUS
  Filled 2014-06-04: qty 40

## 2014-06-04 MED ORDER — ONDANSETRON HCL 4 MG/2ML IJ SOLN
4.0000 mg | Freq: Once | INTRAMUSCULAR | Status: AC
  Administered 2014-06-04: 4 mg via INTRAVENOUS
  Filled 2014-06-04: qty 2

## 2014-06-04 MED ORDER — HYDROMORPHONE HCL PF 1 MG/ML IJ SOLN
1.0000 mg | Freq: Once | INTRAMUSCULAR | Status: AC
  Administered 2014-06-04: 1 mg via INTRAVENOUS
  Filled 2014-06-04: qty 1

## 2014-06-04 MED ORDER — LORAZEPAM 2 MG/ML IJ SOLN
1.0000 mg | Freq: Once | INTRAMUSCULAR | Status: AC
  Administered 2014-06-04: 10:00:00 via INTRAVENOUS
  Filled 2014-06-04: qty 1

## 2014-06-04 MED ORDER — LORAZEPAM 0.5 MG PO TABS: 1.0000 mg | ORAL_TABLET | Freq: Three times a day (TID) | ORAL | Status: DC | PRN

## 2014-06-04 MED ORDER — HYDROCODONE-ACETAMINOPHEN 5-325 MG PO TABS: 1.0000 | ORAL_TABLET | Freq: Four times a day (QID) | ORAL | Status: DC | PRN

## 2014-06-04 NOTE — ED Provider Notes (Signed)
CSN: [id number]     Arrival date & time 06/04/14  [id number] History  This chart was scribed for Nicole LennertJoseph L Zammit, MD by Leone PayorSonum Patel, ED Scribe. This patient was seen in room [facility]11[facility] and the patient's care was started 8:23 AM.    Chief Complaint  Patient presents with  . Abdominal Pain      Patient is a 38 y.o. female presenting with abdominal pain. The history is provided by the patient. No language interpreter was used.  Abdominal Pain Pain location:  Epigastric Pain radiates to:  Does not radiate Pain severity:  Moderate Duration:  5 days Timing:  Constant Progression:  Worsening Chronicity:  Recurrent Associated symptoms: vomiting   Associated symptoms: no chest pain, no cough, no diarrhea, no fatigue and no hematuria     HPI Comments: [name] Main is a 38 y.o. female with past medical history of cholecystectomy who presents to the Emergency Department complaining of 5 days of constant, gradually worsening epigastric abdominal pain. She reports associated 1 episode of emesis yesterday and 1 episode this morning. She reports a history of similar symptoms when she was diagnosed with pancreatitis in April 2015. She denies diarrhea, fever.    Past Medical History  Diagnosis Date  . Stroke   . Asthma    Past Surgical History  Procedure Laterality Date  . Cholecystectomy    . Bladder surgery    . Esophagogastroduodenoscopy N/A 04/10/2014    Procedure: ESOPHAGOGASTRODUODENOSCOPY (EGD);  Surgeon: Malissa HippoNajeeb U Rehman, MD;  Location: [facility];  Service: Endoscopy;  Laterality: N/A;   Family History  Problem Relation Age of Onset  . Pancreatic cancer Mother   . Stroke Mother   . Diabetes Father   . Hypertension Father    History  Substance Use Topics  . Smoking status: Former Smoker -- 1.00 packs/day for 13 years    Types: Cigarettes    Quit date: 06/18/2013  . Smokeless tobacco: Not on file  . Alcohol Use: No   OB History   Grav Para Term Preterm Abortions TAB SAB Ect  Mult Living                 Review of Systems  Constitutional: Negative for appetite change and fatigue.  HENT: Negative for congestion, ear discharge and sinus pressure.   Eyes: Negative for discharge.  Respiratory: Negative for cough.   Cardiovascular: Negative for chest pain.  Gastrointestinal: Positive for vomiting and abdominal pain. Negative for diarrhea.  Genitourinary: Negative for frequency and hematuria.  Musculoskeletal: Negative for back pain.  Skin: Negative for rash.  Neurological: Negative for seizures and headaches.  Psychiatric/Behavioral: Negative for hallucinations.      Allergies  Review of patient's allergies indicates no known allergies.  Home Medications   Prior to Admission medications   Medication Sig Start Date End Date Taking? Authorizing Dorse Locy  albuterol (PROVENTIL HFA;VENTOLIN HFA) 108 (90 BASE) MCG/ACT inhaler Inhale 2 puffs into the lungs every 6 (six) hours as needed for wheezing or shortness of breath.    Historical Jamille Yoshino, MD  aspirin 81 MG chewable tablet Chew 81 mg by mouth daily.    Historical Franshesca Chipman, MD  dicyclomine (BENTYL) 10 MG capsule Take 1 capsule (10 mg total) by mouth 3 (three) times daily before meals. 04/14/14   Standley Brookinganiel P Goodrich, MD  HYDROcodone-acetaminophen (NORCO/VICODIN) 5-325 MG per tablet Take 1 tablet by mouth every 6 (six) hours as needed for moderate pain. 04/14/14   Standley Brookinganiel P Goodrich, MD  LORazepam (ATIVAN)  0.5 MG tablet Take 1 tablet (0.5 mg total) by mouth 2 (two) times daily as needed for anxiety. 04/14/14   Standley Brooking, MD  omeprazole (PRILOSEC) 20 MG capsule Take 1 capsule (20 mg total) by mouth 2 (two) times daily before a meal. 04/14/14   Standley Brooking, MD  ondansetron (ZOFRAN) 4 MG tablet Take 1 tablet (4 mg total) by mouth every 6 (six) hours as needed for nausea. 04/14/14   Standley Brooking, MD  sucralfate (CARAFATE) 1 G tablet Take 1 tablet (1 g total) by mouth 4 (four) times daily -  with meals and at  bedtime. 04/14/14   Standley Brooking, MD   There were no vitals taken for this visit. Physical Exam  Nursing note and vitals reviewed. Constitutional: She is oriented to person, place, and time. She appears well-developed.  HENT:  Head: Normocephalic.  Eyes: Conjunctivae and EOM are normal. No scleral icterus.  Neck: Neck supple. No thyromegaly present.  Cardiovascular: Normal rate and regular rhythm.  Exam reveals no gallop and no friction rub.   No murmur heard. Pulmonary/Chest: Effort normal. No stridor. She has no wheezes. She has no rales. She exhibits no tenderness.  Abdominal: She exhibits no distension. There is tenderness (moderate epigastric tenderness). There is no rebound.  Musculoskeletal: Normal range of motion. She exhibits no edema.  Lymphadenopathy:    She has no cervical adenopathy.  Neurological: She is oriented to person, place, and time. She exhibits normal muscle tone. Coordination normal.  Skin: No rash noted. No erythema.  Psychiatric: She has a normal mood and affect. Her behavior is normal.    ED Course  Procedures (including critical care time) DIAGNOSTIC STUDIES: Oxygen Saturation is 100% on RA, normal by my interpretation.    COORDINATION OF CARE: 8:27 AM Discussed treatment plan with pt at bedside and pt agreed to plan.   Labs Review Labs Reviewed  CBC WITH DIFFERENTIAL - Abnormal; Notable for the following:    RBC 5.35 (*)    Hemoglobin 10.9 (*)    HCT 34.7 (*)    MCV 64.9 (*)    MCH 20.4 (*)    All other components within normal limits  COMPREHENSIVE METABOLIC PANEL - Abnormal; Notable for the following:    Potassium 3.5 (*)    Glucose, Bld 107 (*)    Total Bilirubin 0.2 (*)    All other components within normal limits  URINALYSIS, ROUTINE W REFLEX MICROSCOPIC - Abnormal; Notable for the following:    Hgb urine dipstick SMALL (*)    All other components within normal limits  URINE MICROSCOPIC-ADD ON - Abnormal; Notable for the following:     Squamous Epithelial / LPF MANY (*)    All other components within normal limits  LIPASE, BLOOD  PREGNANCY, URINE    Imaging Review Dg Abd Acute W/chest  06/04/2014   CLINICAL DATA:  Abdominal pain.  EXAM: ACUTE ABDOMEN SERIES (ABDOMEN 2 VIEW & CHEST 1 VIEW)  COMPARISON:  April 07, 2014.  FINDINGS: There is no evidence of dilated bowel loops or free intraperitoneal air. Status post cholecystectomy. Phleboliths are noted in the pelvis. No renal calculi are noted. Heart size and mediastinal contours are within normal limits. Both lungs are clear.  IMPRESSION: No evidence of bowel obstruction or ileus. No acute cardiopulmonary disease.   Electronically Signed   By: Roque Lias Miles.D.   On: 06/04/2014 10:46     EKG Interpretation None      MDM  Final diagnoses:  None   Chronic abd pain,  Will tx with pain meds and close follow up   The chart was scribed for me under my direct supervision.  I personally performed the history, physical, and medical decision making and all procedures in the evaluation of this patient.[name].   Joseph L Zammit, MD 06/04/14 [phone]

## 2014-06-04 NOTE — Discharge Instructions (Signed)
Follow up with Dr. Kelvin Cellarehmann for your stomach pain.   Follow up with Dr. Renard MatterMcInnis, or Dr. Lodema HongSimpson or Dr. Doristine SectionGossrani for a family md

## 2014-06-04 NOTE — ED Notes (Signed)
Pt ambulatory to restroom, tolerated well,  

## 2014-06-04 NOTE — Care Management Note (Signed)
ED/CM noted patient did not have health insurance and/or PCP listed in the computer.  Patient was given the [place] resource handout with information on the clinics, food pantries, and the handout for new health insurance sign-up.  Patient expressed appreciation for information received. Pt was also given a Rx assistance card. 

## 2014-06-04 NOTE — ED Notes (Signed)
Patient reports mid abdominal pain for 5 days, has worsened over that time and now vomited x1 today. Previous pancreatitis hx.

## 2014-06-04 NOTE — ED Notes (Signed)
Pt returned from X Ray.

## 2014-06-04 NOTE — ED Notes (Signed)
Pt c/o abd pain with n/v that started 5 days ago, "some diarrhea", unable to keep anything down, has hx of pancreatitis with recent admission, unable to follow up with GI or PCP due to not having insurance,

## 2014-06-04 NOTE — ED Notes (Signed)
Dr Zammit notified,  

## 2014-08-26 ENCOUNTER — Emergency Department (HOSPITAL_COMMUNITY): Payer: Self-pay

## 2014-08-26 ENCOUNTER — Emergency Department (HOSPITAL_COMMUNITY)
Admission: EM | Admit: 2014-08-26 | Discharge: 2014-08-26 | Disposition: A | Discharge status: Home / Self Care | Payer: Self-pay | Attending: Emergency Medicine | Admitting: Emergency Medicine

## 2014-08-26 ENCOUNTER — Encounter (HOSPITAL_COMMUNITY): Payer: Self-pay | Admitting: Emergency Medicine

## 2014-08-26 DIAGNOSIS — R197 Diarrhea, unspecified: Secondary | ICD-10-CM | POA: Insufficient documentation

## 2014-08-26 DIAGNOSIS — R1032 Left lower quadrant pain: Secondary | ICD-10-CM | POA: Insufficient documentation

## 2014-08-26 DIAGNOSIS — R1013 Epigastric pain: Secondary | ICD-10-CM | POA: Insufficient documentation

## 2014-08-26 DIAGNOSIS — Z79899 Other long term (current) drug therapy: Secondary | ICD-10-CM | POA: Insufficient documentation

## 2014-08-26 DIAGNOSIS — Z3202 Encounter for pregnancy test, result negative: Secondary | ICD-10-CM | POA: Insufficient documentation

## 2014-08-26 DIAGNOSIS — Z9089 Acquired absence of other organs: Secondary | ICD-10-CM | POA: Insufficient documentation

## 2014-08-26 DIAGNOSIS — Z87891 Personal history of nicotine dependence: Secondary | ICD-10-CM | POA: Insufficient documentation

## 2014-08-26 DIAGNOSIS — Z7982 Long term (current) use of aspirin: Secondary | ICD-10-CM | POA: Insufficient documentation

## 2014-08-26 DIAGNOSIS — J45901 Unspecified asthma with (acute) exacerbation: Secondary | ICD-10-CM | POA: Insufficient documentation

## 2014-08-26 DIAGNOSIS — R1110 Vomiting, unspecified: Secondary | ICD-10-CM | POA: Insufficient documentation

## 2014-08-26 DIAGNOSIS — R109 Unspecified abdominal pain: Secondary | ICD-10-CM | POA: Insufficient documentation

## 2014-08-26 DIAGNOSIS — Z8673 Personal history of transient ischemic attack (TIA), and cerebral infarction without residual deficits: Secondary | ICD-10-CM | POA: Insufficient documentation

## 2014-08-26 LAB — COMPREHENSIVE METABOLIC PANEL
ALK PHOS: 57 U/L (ref 39–117)
ALT: 10 U/L (ref 0–35)
AST: 17 U/L (ref 0–37)
Albumin: 4.1 g/dL (ref 3.5–5.2)
Anion gap: 11 (ref 5–15)
BUN: 12 mg/dL (ref 6–23)
CHLORIDE: 103 meq/L (ref 96–112)
CO2: 25 mEq/L (ref 19–32)
Calcium: 9 mg/dL (ref 8.4–10.5)
Creatinine, Ser: 0.84 mg/dL (ref 0.50–1.10)
GFR calc non Af Amer: 86 mL/min — ABNORMAL LOW (ref >90)
GLUCOSE: 103 mg/dL — AB (ref 70–99)
POTASSIUM: 3.7 meq/L (ref 3.7–5.3)
Sodium: 139 mEq/L (ref 137–147)
Total Bilirubin: 0.4 mg/dL (ref 0.3–1.2)
Total Protein: 7.4 g/dL (ref 6.0–8.3)

## 2014-08-26 LAB — URINALYSIS, ROUTINE W REFLEX MICROSCOPIC
Bilirubin Urine: NEGATIVE
GLUCOSE, UA: NEGATIVE mg/dL
Ketones, ur: NEGATIVE mg/dL
Leukocytes, UA: NEGATIVE
Nitrite: NEGATIVE
PROTEIN: NEGATIVE mg/dL
Specific Gravity, Urine: <1.005 — ABNORMAL LOW (ref 1.005–1.030)
Urobilinogen, UA: 0.2 mg/dL (ref 0.0–1.0)
pH: 5.5 (ref 5.0–8.0)

## 2014-08-26 LAB — LIPASE, BLOOD: Lipase: 30 U/L (ref 11–59)

## 2014-08-26 LAB — PREGNANCY, URINE: PREG TEST UR: NEGATIVE

## 2014-08-26 LAB — URINE MICROSCOPIC-ADD ON

## 2014-08-26 LAB — CBC WITH DIFFERENTIAL/PLATELET
Basophils Absolute: 0.1 10*3/uL (ref 0.0–0.1)
Basophils Relative: 1 % (ref 0–1)
Eosinophils Absolute: 0.2 10*3/uL (ref 0.0–0.7)
Eosinophils Relative: 2 % (ref 0–5)
HCT: 34.7 % — ABNORMAL LOW (ref 36.0–46.0)
Hemoglobin: 11.3 g/dL — ABNORMAL LOW (ref 12.0–15.0)
Lymphocytes Relative: 33 % (ref 12–46)
Lymphs Abs: 3 10*3/uL (ref 0.7–4.0)
MCH: 20.5 pg — ABNORMAL LOW (ref 26.0–34.0)
MCHC: 32.6 g/dL (ref 30.0–36.0)
MCV: 63 fL — ABNORMAL LOW (ref 78.0–100.0)
Monocytes Absolute: 0.4 10*3/uL (ref 0.1–1.0)
Monocytes Relative: 5 % (ref 3–12)
NEUTROS PCT: 60 % (ref 43–77)
Neutro Abs: 5.4 10*3/uL (ref 1.7–7.7)
PLATELETS: 318 10*3/uL (ref 150–400)
RBC: 5.51 MIL/uL — ABNORMAL HIGH (ref 3.87–5.11)
RDW: 15.1 % (ref 11.5–15.5)
WBC: 9.1 10*3/uL (ref 4.0–10.5)

## 2014-08-26 LAB — POC OCCULT BLOOD, ED: Fecal Occult Bld: NEGATIVE

## 2014-08-26 MED ORDER — ONDANSETRON HCL 4 MG/2ML IJ SOLN
4.0000 mg | Freq: Once | INTRAMUSCULAR | Status: AC
  Administered 2014-08-26: 4 mg via INTRAVENOUS
  Filled 2014-08-26: qty 2

## 2014-08-26 MED ORDER — HYDROMORPHONE HCL PF 1 MG/ML IJ SOLN
1.0000 mg | Freq: Once | INTRAMUSCULAR | Status: AC
  Administered 2014-08-26: 1 mg via INTRAVENOUS
  Filled 2014-08-26: qty 1

## 2014-08-26 MED ORDER — SODIUM CHLORIDE 0.9 % IV SOLN
80.0000 mg | Freq: Once | INTRAVENOUS | Status: AC
  Administered 2014-08-26: 80 mg via INTRAVENOUS
  Filled 2014-08-26: qty 80

## 2014-08-26 MED ORDER — ONDANSETRON 8 MG PO TBDP: 8.0000 mg | ORAL_TABLET | Freq: Three times a day (TID) | ORAL | Status: DC | PRN

## 2014-08-26 MED ORDER — IOHEXOL 300 MG/ML  SOLN
100.0000 mL | Freq: Once | INTRAMUSCULAR | Status: AC | PRN
  Administered 2014-08-26: 100 mL via INTRAVENOUS

## 2014-08-26 MED ORDER — OXYCODONE-ACETAMINOPHEN 5-325 MG PO TABS: 1.0000 | ORAL_TABLET | ORAL | Status: DC | PRN

## 2014-08-26 MED ORDER — SODIUM CHLORIDE 0.9 % IV BOLUS (SEPSIS)
1000.0000 mL | Freq: Once | INTRAVENOUS | Status: AC
  Administered 2014-08-26: 1000 mL via INTRAVENOUS

## 2014-08-26 NOTE — ED Notes (Signed)
Patient complaining of lower left abdominal pain x 3 days with vomiting and diarrhea starting yesterday.

## 2014-08-26 NOTE — ED Notes (Signed)
MD at bedside. 

## 2014-08-26 NOTE — ED Provider Notes (Signed)
CSN: [id number]     Arrival date & time 08/26/14  4098 History  This chart was scribed for Nicole Quarry, MD by Milly Jakob, ED Scribe. The patient was seen in room APA06/APA06. Patient's care was started at 8:59 AM.   Chief Complaint  Patient presents with  . Abdominal Pain  . Emesis   Patient is a 38 y.o. female presenting with abdominal pain and vomiting. The history is provided by the patient. No language interpreter was used.  Abdominal Pain Pain location:  LLQ Pain quality: aching and sharp   Pain radiates to:  Epigastric region Pain severity:  Moderate Onset quality:  Gradual Duration:  3 days Timing:  Constant Progression:  Unchanged Chronicity:  Recurrent Relieved by:  Nothing Worsened by:  Nothing tried Ineffective treatments:  None tried Associated symptoms: diarrhea, shortness of breath and vomiting   Emesis Associated symptoms: abdominal pain and diarrhea    HPI Comments: [name] is a 38 y.o. female who presents to the Emergency Department complaining of severe, constant, gradual onset, LLQ abdominal pain that began three days ago. She reports associated diarrhea, nausea, vomiting, and mild SOB. She reports two episodes of diarrhea and vomiting this morning. She reports taking OTC medication for diarrhea with minimal relief. She states that she last ate last night and was not able to keep it down. She denies hematochezia or hematemesis. She denies hematuria, dark urine, or vaginal discharge. She reports that she was admitted to the hospital for acute pancreatitis in the past, and seen here for similar symptoms 4 months ago. She reports a history of cholecystectomy and bladder surgey. She denies being around any ill individuals. She states that she [facility] for [facility] care. LNMP was August 16. She reports two pregnancies and two children.   Past Medical History  Diagnosis Date  . Stroke   . Asthma    Past Surgical History  Procedure  Laterality Date  . Cholecystectomy    . Bladder surgery    . Esophagogastroduodenoscopy N/A 04/10/2014    Procedure: ESOPHAGOGASTRODUODENOSCOPY (EGD);  Surgeon: Malissa Hippo, MD;  Location: [facility];  Service: Endoscopy;  Laterality: N/A;   Family History  Problem Relation Age of Onset  . Pancreatic cancer Mother   . Stroke Mother   . Diabetes Father   . Hypertension Father    History  Substance Use Topics  . Smoking status: Former Smoker -- 1.00 packs/day for 13 years    Types: Cigarettes    Quit date: 06/18/2013  . Smokeless tobacco: Not on file  . Alcohol Use: No   OB History   Grav Para Term Preterm Abortions TAB SAB Ect Mult Living                 Review of Systems  Respiratory: Positive for shortness of breath.   Gastrointestinal: Positive for vomiting, abdominal pain and diarrhea.  All other systems reviewed and are negative.  Allergies  Review of patient's allergies indicates no known allergies.  Home Medications   Prior to Admission medications   Medication Sig Start Date End Date Taking? Authorizing Provider  traZODone (DESYREL) 100 MG tablet Take 100 mg by mouth at bedtime.   Yes Historical Provider, MD  albuterol (PROVENTIL HFA;VENTOLIN HFA) 108 (90 BASE) MCG/ACT inhaler Inhale 2 puffs into the lungs every 6 (six) hours as needed for wheezing or shortness of breath.    Historical Provider, MD  aspirin 81 MG chewable tablet Chew  81 mg by mouth daily.    Historical Provider, MD  dicyclomine (BENTYL) 10 MG capsule Take 1 capsule (10 mg total) by mouth 3 (three) times daily before meals. 04/14/14   Standley Brooking, MD  HYDROcodone-acetaminophen (NORCO/VICODIN) 5-325 MG per tablet Take 1 tablet by mouth every 6 (six) hours as needed for moderate pain. 06/04/14   Benny Lennert, MD  LORazepam (ATIVAN) 0.5 MG tablet Take 1 tablet (0.5 mg total) by mouth 2 (two) times daily as needed for anxiety. 04/14/14   Standley Brooking, MD  LORazepam (ATIVAN) 0.5 MG tablet  Take 2 tablets (1 mg total) by mouth every 8 (eight) hours as needed for anxiety. 06/04/14   Benny Lennert, MD  omeprazole (PRILOSEC) 20 MG capsule Take 1 capsule (20 mg total) by mouth 2 (two) times daily before a meal. 04/14/14   Standley Brooking, MD  promethazine (PHENERGAN) 25 MG tablet Take 1 tablet (25 mg total) by mouth every 6 (six) hours as needed for nausea or vomiting. 06/04/14   Benny Lennert, MD  sucralfate (CARAFATE) 1 G tablet Take 1 tablet (1 g total) by mouth 4 (four) times daily -  with meals and at bedtime. 04/14/14   Standley Brooking, MD   Triage Vitals: BP 134/78  Pulse 97  Temp(Src) 98.4 F (36.9 C) (Oral)  Resp 16  Ht  (1.448 m)  Wt 122 lb (55.339 kg)  BMI 26.39 kg/m2  SpO2 100%  LMP 07/28/2014 Physical Exam  Nursing note and vitals reviewed. Constitutional:  Awake, alert, nontoxic appearance with baseline speech for patient.  HENT:  Head: Atraumatic.  Mouth/Throat: No oropharyngeal exudate.  Eyes: EOM are normal. Pupils are equal, round, and reactive to light. Right eye exhibits no discharge. Left eye exhibits no discharge.  Neck: Neck supple.  Cardiovascular: Normal rate and regular rhythm.   No murmur heard. Pulmonary/Chest: Effort normal and breath sounds normal. No stridor. No respiratory distress. She has no wheezes. She has no rales. She exhibits no tenderness.  Abdominal: Soft. Bowel sounds are normal. She exhibits no mass. There is tenderness. There is no rebound.  Bowel sounds are present. Epigastric tenderness to palpation.  Musculoskeletal: She exhibits no tenderness.  Baseline ROM, moves extremities with no obvious new focal weakness.  Lymphadenopathy:    She has no cervical adenopathy.  Skin: No rash noted.  Psychiatric: She has a normal mood and affect.    ED Course  Procedures (including critical care time) DIAGNOSTIC STUDIES: Oxygen Saturation is 100% on room air, normal by my interpretation.    COORDINATION OF CARE: 9:09  AM-Discussed treatment plan which includes Dilaudid, Zofran,CBC, UA, Abdominal CT-Scan,  and CXR with pt at bedside and pt agreed to plan.   11:35 AM - Discussed normal imaging and lab findings, prescribed antiemetics, and return precautions. Pt was agreeable to plan.  Labs Review Labs Reviewed  CBC WITH DIFFERENTIAL - Abnormal; Notable for the following:    RBC 5.51 (*)    Hemoglobin 11.3 (*)    HCT 34.7 (*)    MCV 63.0 (*)    MCH 20.5 (*)    All other components within normal limits  COMPREHENSIVE METABOLIC PANEL - Abnormal; Notable for the following:    Glucose, Bld 103 (*)    GFR calc non Af Amer 86 (*)    All other components within normal limits  URINALYSIS, ROUTINE W REFLEX MICROSCOPIC - Abnormal; Notable for the following:    Specific Gravity, Urine <  1.005 (*)    Hgb urine dipstick TRACE (*)    All other components within normal limits  LIPASE, BLOOD  PREGNANCY, URINE  URINE MICROSCOPIC-ADD ON  OCCULT BLOOD X 1 CARD TO LAB, STOOL  POC OCCULT BLOOD, ED    Imaging Review Dg Chest 2 View  08/26/2014   CLINICAL DATA:  Cough for 3 days.  EXAM: CHEST  2 VIEW  COMPARISON:  March 04, 2014  FINDINGS: The heart size and mediastinal contours are within normal limits. There is no focal infiltrate, pulmonary edema, or pleural effusion. Prior cholecystectomy clips are noted. There is scoliosis of spine.  IMPRESSION: No active cardiopulmonary disease.   Electronically Signed   By: Sherian Rein M.D.   On: 08/26/2014 09:58   Ct Abdomen Pelvis W Contrast  08/26/2014   CLINICAL DATA:  Left lower quadrant abdominal pain.  EXAM: CT ABDOMEN AND PELVIS WITH CONTRAST  TECHNIQUE: Multidetector CT imaging of the abdomen and pelvis was performed using the standard protocol following bolus administration of intravenous contrast.  CONTRAST:  OMNIPAQUE IOHEXOL 300 MG/ML  SOLN  COMPARISON:  CT scan of July 12, 2014.  FINDINGS: Visualized lung bases appear normal. No significant osseous abnormality  is noted.  Status post cholecystectomy. Stable dilatation of the intrahepatic and extrahepatic bile ducts is noted which is unchanged compared to prior exam and most likely related to post cholecystectomy status. Otherwise, the liver, spleen and pancreas appear normal. Adrenal glands and kidneys appear normal. No hydronephrosis or renal obstruction is noted. No renal or ureteral calculi are noted. The appendix appears normal. Uterus and urinary bladder appear normal. There is no evidence of bowel obstruction. No abnormal fluid collection is noted.  IMPRESSION: Status post cholecystectomy. No acute abnormality seen in the abdomen or pelvis.   Electronically Signed   By: Roque Lias M.D.   On: 08/26/2014 10:24     EKG Interpretation None      MDM   Final diagnoses:  Epigastric pain    I personally performed the services described in this documentation, which was scribed in my presence. The recorded information has been reviewed and considered.   Nicole Quarry, MD 08/26/14 1150

## 2014-08-26 NOTE — Discharge Instructions (Signed)

## 2014-08-26 NOTE — ED Notes (Signed)
Pharmacy called to mix protonix infusion.

## 2014-11-23 ENCOUNTER — Encounter (HOSPITAL_COMMUNITY): Payer: Self-pay | Admitting: Emergency Medicine

## 2014-11-23 ENCOUNTER — Emergency Department (HOSPITAL_COMMUNITY)
Admission: EM | Admit: 2014-11-23 | Discharge: 2014-11-23 | Disposition: A | Discharge status: Home / Self Care | Payer: MEDICAID | Attending: Emergency Medicine | Admitting: Emergency Medicine

## 2014-11-23 DIAGNOSIS — Z9049 Acquired absence of other specified parts of digestive tract: Secondary | ICD-10-CM | POA: Insufficient documentation

## 2014-11-23 DIAGNOSIS — Z79899 Other long term (current) drug therapy: Secondary | ICD-10-CM | POA: Insufficient documentation

## 2014-11-23 DIAGNOSIS — J45909 Unspecified asthma, uncomplicated: Secondary | ICD-10-CM | POA: Insufficient documentation

## 2014-11-23 DIAGNOSIS — N12 Tubulo-interstitial nephritis, not specified as acute or chronic: Secondary | ICD-10-CM | POA: Insufficient documentation

## 2014-11-23 DIAGNOSIS — Z87442 Personal history of urinary calculi: Secondary | ICD-10-CM | POA: Insufficient documentation

## 2014-11-23 DIAGNOSIS — Z87891 Personal history of nicotine dependence: Secondary | ICD-10-CM | POA: Insufficient documentation

## 2014-11-23 DIAGNOSIS — Z3202 Encounter for pregnancy test, result negative: Secondary | ICD-10-CM | POA: Insufficient documentation

## 2014-11-23 DIAGNOSIS — Z9889 Other specified postprocedural states: Secondary | ICD-10-CM | POA: Insufficient documentation

## 2014-11-23 DIAGNOSIS — Z8673 Personal history of transient ischemic attack (TIA), and cerebral infarction without residual deficits: Secondary | ICD-10-CM | POA: Insufficient documentation

## 2014-11-23 LAB — URINE MICROSCOPIC-ADD ON

## 2014-11-23 LAB — URINALYSIS, ROUTINE W REFLEX MICROSCOPIC
BILIRUBIN URINE: NEGATIVE
GLUCOSE, UA: NEGATIVE mg/dL
KETONES UR: NEGATIVE mg/dL
Nitrite: NEGATIVE
Protein, ur: NEGATIVE mg/dL
Specific Gravity, Urine: <1.005 — ABNORMAL LOW (ref 1.005–1.030)
Urobilinogen, UA: 0.2 mg/dL (ref 0.0–1.0)
pH: 6.5 (ref 5.0–8.0)

## 2014-11-23 LAB — PREGNANCY, URINE: Preg Test, Ur: NEGATIVE

## 2014-11-23 MED ORDER — OXYCODONE-ACETAMINOPHEN 5-325 MG PO TABS
1.0000 | ORAL_TABLET | Freq: Once | ORAL | Status: AC
  Administered 2014-11-23: 1 via ORAL
  Filled 2014-11-23: qty 1

## 2014-11-23 MED ORDER — OXYCODONE-ACETAMINOPHEN 5-325 MG PO TABS
1.0000 | ORAL_TABLET | ORAL | Status: DC | PRN
Start: 2014-11-23 — End: 2015-11-18

## 2014-11-23 MED ORDER — CIPROFLOXACIN HCL 500 MG PO TABS
500.0000 mg | ORAL_TABLET | Freq: Two times a day (BID) | ORAL | Status: DC
Start: 2014-11-23 — End: 2015-11-18

## 2014-11-23 NOTE — Discharge Instructions (Signed)
Pyelonephritis, Adult °Pyelonephritis is a kidney infection. In general, there are 2 main types of pyelonephritis: °· Infections that come on quickly without any warning (acute pyelonephritis). °· Infections that persist for a long period of time (chronic pyelonephritis). °CAUSES  °Two main causes of pyelonephritis are: °· Bacteria traveling from the bladder to the kidney. This is a problem especially in pregnant women. The urine in the bladder can become filled with bacteria from multiple causes, including: °¨ Inflammation of the prostate gland (prostatitis). °¨ Sexual intercourse in females. °¨ Bladder infection (cystitis). °· Bacteria traveling from the bloodstream to the tissue part of the kidney. °Problems that may increase your risk of getting a kidney infection include: °· Diabetes. °· Kidney stones or bladder stones. °· Cancer. °· Catheters placed in the bladder. °· Other abnormalities of the kidney or ureter. °SYMPTOMS  °· Abdominal pain. °· Pain in the side or flank area. °· Fever. °· Chills. °· Upset stomach. °· Blood in the urine (dark urine). °· Frequent urination. °· Strong or persistent urge to urinate. °· Burning or stinging when urinating. °DIAGNOSIS  °Your caregiver may diagnose your kidney infection based on your symptoms. A urine sample may also be taken. °TREATMENT  °In general, treatment depends on how severe the infection is.  °· If the infection is mild and caught early, your caregiver may treat you with oral antibiotics and send you home. °· If the infection is more severe, the bacteria may have gotten into the bloodstream. This will require intravenous (IV) antibiotics and a hospital stay. Symptoms may include: °¨ High fever. °¨ Severe flank pain. °¨ Shaking chills. °· Even after a hospital stay, your caregiver may require you to be on oral antibiotics for a period of time. °· Other treatments may be required depending upon the cause of the infection. °HOME CARE INSTRUCTIONS  °· Take your  antibiotics as directed. Finish them even if you start to feel better. °· Make an appointment to have your urine checked to make sure the infection is gone. °· Drink enough fluids to keep your urine clear or pale yellow. °· Take medicines for the bladder if you have urgency and frequency of urination as directed by your caregiver. °SEEK IMMEDIATE MEDICAL CARE IF:  °· You have a fever or persistent symptoms for more than 2-3 days. °· You have a fever and your symptoms suddenly get worse. °· You are unable to take your antibiotics or fluids. °· You develop shaking chills. °· You experience extreme weakness or fainting. °· There is no improvement after 2 days of treatment. °MAKE SURE YOU: °· Understand these instructions. °· Will watch your condition. °· Will get help right away if you are not doing well or get worse. °Document Released: 11/29/2005 Document Revised: 05/30/2012 Document Reviewed: 05/05/2011 °[employer]® Patient Information ©2015 [employer]. This information is not intended to replace advice given to you by your health care provider. Make sure you discuss any questions you have with your health care provider. ° °

## 2014-11-23 NOTE — ED Provider Notes (Signed)
CSN: [id number]     Arrival date & time 11/23/14  [id number] History  This chart was scribe for No att. providers found by [name], ED Scribe. The patient was seen in room APA04/APA04 and the patient's care was started at 10:15 AM.    Chief Complaint  Patient presents with  . Flank Pain   The history is provided by the patient. No language interpreter was used.   HPI Comments: [name] is a 38 y.o. female who presents to the Emergency Department complaining of flank pain onset 2 days ago. She reports associated dysuria. She also reports a hx of kidney stones 2 years ago and adds that these pains are similar to when she had the kidney stones.    Past Medical History  Diagnosis Date  . Stroke   . Asthma    Past Surgical History  Procedure Laterality Date  . Cholecystectomy    . Bladder surgery    . Esophagogastroduodenoscopy N/A 04/10/2014    Procedure: ESOPHAGOGASTRODUODENOSCOPY (EGD);  Surgeon: Malissa HippoNajeeb U Rehman, MD;  Location: [facility];  Service: Endoscopy;  Laterality: N/A;   Family History  Problem Relation Age of Onset  . Pancreatic cancer Mother   . Stroke Mother   . Diabetes Father   . Hypertension Father    History  Substance Use Topics  . Smoking status: Former Smoker -- 1.00 packs/day for 13 years    Types: Cigarettes    Quit date: 06/18/2013  . Smokeless tobacco: Not on file  . Alcohol Use: No   OB History    No data available     Review of Systems  Constitutional: Negative for fever.  Genitourinary: Positive for dysuria and flank pain.      Allergies  Review of patient's allergies indicates no known allergies.  Home Medications   Prior to Admission medications   Medication Sig Start Date End Date Taking? Authorizing Provider  albuterol (PROVENTIL HFA;VENTOLIN HFA) 108 (90 BASE) MCG/ACT inhaler Inhale 2 puffs into the lungs every 6 (six) hours as needed for wheezing or shortness of breath.   Yes Historical Provider, MD  ibuprofen  (ADVIL,MOTRIN) 200 MG tablet Take 600 mg by mouth every 6 (six) hours as needed for moderate pain.   Yes Historical Provider, MD  loratadine (CLARITIN) 10 MG tablet Take 10 mg by mouth daily.   Yes Historical Provider, MD  LORazepam (ATIVAN) 0.5 MG tablet Take 2 tablets (1 mg total) by mouth every 8 (eight) hours as needed for anxiety. 06/04/14  Yes Benny LennertJoseph L Zammit, MD  omeprazole (PRILOSEC) 20 MG capsule Take 1 capsule (20 mg total) by mouth 2 (two) times daily before a meal. 04/14/14  Yes Standley Brookinganiel P Goodrich, MD  ondansetron (ZOFRAN ODT) 8 MG disintegrating tablet Take 1 tablet (8 mg total) by mouth every 8 (eight) hours as needed for nausea or vomiting. 08/26/14  Yes Hilario Quarryanielle S Ray, MD  traZODone (DESYREL) 100 MG tablet Take 100 mg by mouth at bedtime.   Yes Historical Provider, MD  ciprofloxacin (CIPRO) 500 MG tablet Take 1 tablet (500 mg total) by mouth 2 (two) times daily. 11/23/14   Juliet RudeNathan R. Ivon Roedel, MD  oxyCODONE-acetaminophen (PERCOCET/ROXICET) 5-325 MG per tablet Take 1 tablet by mouth every 4 (four) hours as needed for severe pain. 11/23/14   Juliet RudeNathan R. Tamatha Gadbois, MD   BP 134/83 mmHg  Pulse 66  Temp(Src) 97.9 F (36.6 C) (Oral)  Resp 18  Ht 4\' 8"  (1.422 m)  Wt 121 lb (54.885  kg)  BMI 27.14 kg/m2  SpO2 98%  LMP 11/17/2014 Physical Exam  Constitutional: She is oriented to person, place, and time. She appears well-developed and well-nourished. No distress.  HENT:  Head: Normocephalic and atraumatic.  Eyes: Conjunctivae and EOM are normal.  Neck: Neck supple. No tracheal deviation present.  Cardiovascular: Normal rate.   Pulmonary/Chest: Effort normal. No respiratory distress.  Abdominal: There is no tenderness.  Genitourinary:  CVA to lower back with rash  Musculoskeletal: Normal range of motion. She exhibits no edema.  Neurological: She is alert and oriented to person, place, and time.  Skin: Skin is warm and dry.  Psychiatric: She has a normal mood and affect. Her behavior is  normal.  Nursing note and vitals reviewed.   ED Course  Procedures (including critical care time) Results for orders placed or performed during the hospital encounter of 11/23/14  Urinalysis, Routine w reflex microscopic  Result Value Ref Range   Color, Urine YELLOW YELLOW   APPearance CLEAR CLEAR   Specific Gravity, Urine <1.005 (L) 1.005 - 1.030   pH 6.5 5.0 - 8.0   Glucose, UA NEGATIVE NEGATIVE mg/dL   Hgb urine dipstick SMALL (A) NEGATIVE   Bilirubin Urine NEGATIVE NEGATIVE   Ketones, ur NEGATIVE NEGATIVE mg/dL   Protein, ur NEGATIVE NEGATIVE mg/dL   Urobilinogen, UA 0.2 0.0 - 1.0 mg/dL   Nitrite NEGATIVE NEGATIVE   Leukocytes, UA SMALL (A) NEGATIVE  Urine microscopic-add on  Result Value Ref Range   Squamous Epithelial / LPF MANY (A) RARE   WBC, UA 3-6 <3 WBC/hpf   RBC / HPF 3-6 <3 RBC/hpf   Bacteria, UA FEW (A) RARE  Pregnancy, urine  Result Value Ref Range   Preg Test, Ur NEGATIVE NEGATIVE   No results found.   DIAGNOSTIC STUDIES: Oxygen Saturation is 100% on RA, normal by my interpretation.    COORDINATION OF CARE: 10:18 AM- Pt advised of plan for treatment and pt agrees.    Labs Review Labs Reviewed  URINALYSIS, ROUTINE W REFLEX MICROSCOPIC - Abnormal; Notable for the following:    Specific Gravity, Urine <1.005 (*)    Hgb urine dipstick SMALL (*)    Leukocytes, UA SMALL (*)    All other components within normal limits  URINE MICROSCOPIC-ADD ON - Abnormal; Notable for the following:    Squamous Epithelial / LPF MANY (*)    Bacteria, UA FEW (*)    All other components within normal limits  URINE CULTURE  PREGNANCY, URINE    Imaging Review No results found.   EKG Interpretation None      MDM   Final diagnoses:  Pyelonephritis     patient with abdominal pain. Has possible UTI. Has had multiple visits for abdominal pain. CTs have never shown a stone. Has seen GI in the past also. Will discharge home.  I personally performed the services  described in this documentation, which was scribed in my presence. The recorded information has been reviewed and is accurate.    Juliet RudeNathan R. Rubin PayorPickering, MD 11/23/14 1620

## 2014-11-23 NOTE — ED Notes (Signed)
PT reports left flank pain with urinary retention x1 day. PT reports hx of kidney stones.

## 2014-11-25 LAB — URINE CULTURE

## 2014-11-28 ENCOUNTER — Emergency Department (HOSPITAL_COMMUNITY)
Admission: EM | Admit: 2014-11-28 | Discharge: 2014-11-28 | Disposition: A | Discharge status: Home / Self Care | Payer: Self-pay | Attending: Emergency Medicine | Admitting: Emergency Medicine

## 2014-11-28 ENCOUNTER — Encounter (HOSPITAL_COMMUNITY): Payer: Self-pay | Admitting: Emergency Medicine

## 2014-11-28 ENCOUNTER — Emergency Department (HOSPITAL_COMMUNITY): Payer: Self-pay

## 2014-11-28 DIAGNOSIS — R109 Unspecified abdominal pain: Secondary | ICD-10-CM

## 2014-11-28 DIAGNOSIS — J45901 Unspecified asthma with (acute) exacerbation: Secondary | ICD-10-CM | POA: Insufficient documentation

## 2014-11-28 DIAGNOSIS — Z87891 Personal history of nicotine dependence: Secondary | ICD-10-CM | POA: Insufficient documentation

## 2014-11-28 DIAGNOSIS — Z8673 Personal history of transient ischemic attack (TIA), and cerebral infarction without residual deficits: Secondary | ICD-10-CM | POA: Insufficient documentation

## 2014-11-28 DIAGNOSIS — Z87440 Personal history of urinary (tract) infections: Secondary | ICD-10-CM | POA: Insufficient documentation

## 2014-11-28 DIAGNOSIS — Z79899 Other long term (current) drug therapy: Secondary | ICD-10-CM | POA: Insufficient documentation

## 2014-11-28 DIAGNOSIS — M549 Dorsalgia, unspecified: Secondary | ICD-10-CM | POA: Insufficient documentation

## 2014-11-28 DIAGNOSIS — K529 Noninfective gastroenteritis and colitis, unspecified: Secondary | ICD-10-CM | POA: Insufficient documentation

## 2014-11-28 DIAGNOSIS — Z9049 Acquired absence of other specified parts of digestive tract: Secondary | ICD-10-CM | POA: Insufficient documentation

## 2014-11-28 LAB — CBC WITH DIFFERENTIAL/PLATELET
Basophils Absolute: 0.1 10*3/uL (ref 0.0–0.1)
Basophils Relative: 0 % (ref 0–1)
EOS ABS: 0.1 10*3/uL (ref 0.0–0.7)
Eosinophils Relative: 1 % (ref 0–5)
HEMATOCRIT: 39.1 % (ref 36.0–46.0)
HEMOGLOBIN: 12.5 g/dL (ref 12.0–15.0)
Lymphocytes Relative: 23 % (ref 12–46)
Lymphs Abs: 3.2 10*3/uL (ref 0.7–4.0)
MCH: 20.3 pg — AB (ref 26.0–34.0)
MCHC: 32 g/dL (ref 30.0–36.0)
MCV: 63.6 fL — AB (ref 78.0–100.0)
MONO ABS: 0.7 10*3/uL (ref 0.1–1.0)
MONOS PCT: 5 % (ref 3–12)
NEUTROS ABS: 9.9 10*3/uL — AB (ref 1.7–7.7)
Neutrophils Relative %: 71 % (ref 43–77)
Platelets: 424 10*3/uL — ABNORMAL HIGH (ref 150–400)
RBC: 6.15 MIL/uL — ABNORMAL HIGH (ref 3.87–5.11)
RDW: 14.7 % (ref 11.5–15.5)
WBC: 13.9 10*3/uL — ABNORMAL HIGH (ref 4.0–10.5)

## 2014-11-28 LAB — COMPREHENSIVE METABOLIC PANEL
ALBUMIN: 4.8 g/dL (ref 3.5–5.2)
ALK PHOS: 79 U/L (ref 39–117)
ALT: 15 U/L (ref 0–35)
ANION GAP: 15 (ref 5–15)
AST: 22 U/L (ref 0–37)
BILIRUBIN TOTAL: 0.7 mg/dL (ref 0.3–1.2)
BUN: 14 mg/dL (ref 6–23)
CO2: 25 mEq/L (ref 19–32)
Calcium: 10 mg/dL (ref 8.4–10.5)
Chloride: 100 mEq/L (ref 96–112)
Creatinine, Ser: 0.89 mg/dL (ref 0.50–1.10)
GFR calc Af Amer: >90 mL/min (ref >90)
GFR calc non Af Amer: 81 mL/min — ABNORMAL LOW (ref >90)
Glucose, Bld: 105 mg/dL — ABNORMAL HIGH (ref 70–99)
POTASSIUM: 3.6 meq/L — AB (ref 3.7–5.3)
SODIUM: 140 meq/L (ref 137–147)
TOTAL PROTEIN: 8.7 g/dL — AB (ref 6.0–8.3)

## 2014-11-28 LAB — LIPASE, BLOOD: Lipase: 21 U/L (ref 11–59)

## 2014-11-28 MED ORDER — SODIUM CHLORIDE 0.9 % IV SOLN
INTRAVENOUS | Status: DC
  Administered 2014-11-28: 17:00:00 via INTRAVENOUS

## 2014-11-28 MED ORDER — ONDANSETRON HCL 4 MG/2ML IJ SOLN
4.0000 mg | Freq: Once | INTRAMUSCULAR | Status: AC
  Administered 2014-11-28: 4 mg via INTRAVENOUS
  Filled 2014-11-28: qty 2

## 2014-11-28 MED ORDER — HYDROCODONE-ACETAMINOPHEN 5-325 MG PO TABS: 1.0000 | ORAL_TABLET | Freq: Four times a day (QID) | ORAL | Status: DC | PRN

## 2014-11-28 MED ORDER — HYDROMORPHONE HCL 1 MG/ML IJ SOLN
1.0000 mg | Freq: Once | INTRAMUSCULAR | Status: AC
  Administered 2014-11-28: 1 mg via INTRAVENOUS
  Filled 2014-11-28: qty 1

## 2014-11-28 MED ORDER — PROMETHAZINE HCL 25 MG PO TABS: 25.0000 mg | ORAL_TABLET | Freq: Four times a day (QID) | ORAL | Status: DC | PRN

## 2014-11-28 MED ORDER — SODIUM CHLORIDE 0.9 % IV BOLUS (SEPSIS)
1000.0000 mL | Freq: Once | INTRAVENOUS | Status: AC
  Administered 2014-11-28: 1000 mL via INTRAVENOUS

## 2014-11-28 NOTE — ED Notes (Signed)
Having n/v/d X2 days.  In last 24 hours vomited about 20 times (greenish in color)and about 15 watery liquid stools ( yellowish in color).  C/o abdomen pain rates pain 9 at this time.  Have not taken any medications today.  Was seen here Saturday for UTI and treated with antibiotics and percocet.

## 2014-11-28 NOTE — ED Provider Notes (Signed)
CSN: [id number]     Arrival date & time 11/28/14  1358 History  This chart was scribed for Vanetta MuldersScott Arsen Mangione, MD by Gwenyth Oberatherine Macek, ED Scribe. This patient was seen in room [facility]04/[facility]04 and the patient's care was started at 2:40 PM.    Chief Complaint  Patient presents with  . Abdominal Pain   Patient is a 38 y.o. female presenting with abdominal pain. The history is provided by the patient. No language interpreter was used.  Abdominal Pain Associated symptoms: diarrhea, dysuria, nausea, shortness of breath and vomiting   Associated symptoms: no chest pain, no chills, no cough, no fever, no hematuria and no sore throat     HPI Comments: [name] is a 38 y.o. female who presents to the Emergency Department complaining of 9/10, aching, non-radiating RUQ abdominal pain that started 2 days ago. She states 15 episodes of diarrhea and 20 episodes of vomiting as associated symptoms. Pt has a history of similar symptoms that occurred in 2006 and were diagnosed as pancreatitis. Pt was also in the ED on 12/12 and treated for UTI. She notes continued, mild dysuria. Pt denies blood in stool, hematuria and hematemesis as associated symptoms.   [facility]  Past Medical History  Diagnosis Date  . Stroke   . Asthma    Past Surgical History  Procedure Laterality Date  . Cholecystectomy    . Bladder surgery    . Esophagogastroduodenoscopy N/A 04/10/2014    Procedure: ESOPHAGOGASTRODUODENOSCOPY (EGD);  Surgeon: Malissa HippoNajeeb U Rehman, MD;  Location: [facility];  Service: Endoscopy;  Laterality: N/A;   Family History  Problem Relation Age of Onset  . Pancreatic cancer Mother   . Stroke Mother   . Diabetes Father   . Hypertension Father    History  Substance Use Topics  . Smoking status: Former Smoker -- 1.00 packs/day for 13 years    Types: Cigarettes    Quit date: 06/18/2013  . Smokeless tobacco: Not on file  . Alcohol Use: No   OB History    No data available     Review of  Systems  Constitutional: Negative for fever and chills.  HENT: Negative for congestion, rhinorrhea and sore throat.   Eyes: Negative for visual disturbance.  Respiratory: Positive for shortness of breath. Negative for cough.   Cardiovascular: Negative for chest pain and leg swelling.  Gastrointestinal: Positive for nausea, vomiting, abdominal pain and diarrhea.  Genitourinary: Positive for dysuria. Negative for hematuria.  Musculoskeletal: Positive for back pain.  Skin: Negative for rash.  Neurological: Negative for headaches.  Hematological: Does not bruise/bleed easily.  Psychiatric/Behavioral: Negative for confusion.    Allergies  Review of patient's allergies indicates no known allergies.  Home Medications   Prior to Admission medications   Medication Sig Start Date End Date Taking? Authorizing Provider  albuterol (PROVENTIL HFA;VENTOLIN HFA) 108 (90 BASE) MCG/ACT inhaler Inhale 2 puffs into the lungs every 6 (six) hours as needed for wheezing or shortness of breath.   Yes Historical Provider, MD  ciprofloxacin (CIPRO) 500 MG tablet Take 1 tablet (500 mg total) by mouth 2 (two) times daily. 11/23/14  Yes Nathan R. Pickering, MD  ibuprofen (ADVIL,MOTRIN) 200 MG tablet Take 600 mg by mouth every 6 (six) hours as needed for moderate pain.   Yes Historical Provider, MD  loratadine (CLARITIN) 10 MG tablet Take 10 mg by mouth daily.   Yes Historical Provider, MD  LORazepam (ATIVAN) 0.5 MG tablet Take 2 tablets (1 mg total)  by mouth every 8 (eight) hours as needed for anxiety. 06/04/14  Yes Benny LennertJoseph L Zammit, MD  omeprazole (PRILOSEC) 20 MG capsule Take 1 capsule (20 mg total) by mouth 2 (two) times daily before a meal. 04/14/14  Yes Standley Brookinganiel P Goodrich, MD  oxyCODONE-acetaminophen (PERCOCET/ROXICET) 5-325 MG per tablet Take 1 tablet by mouth every 4 (four) hours as needed for severe pain. 11/23/14  Yes Nathan R. Pickering, MD  traZODone (DESYREL) 100 MG tablet Take 100 mg by mouth at bedtime.    Yes Historical Provider, MD  HYDROcodone-acetaminophen (NORCO/VICODIN) 5-325 MG per tablet Take 1-2 tablets by mouth every 6 (six) hours as needed. 11/28/14   Vanetta MuldersScott Charls Custer, MD  ondansetron (ZOFRAN ODT) 8 MG disintegrating tablet Take 1 tablet (8 mg total) by mouth every 8 (eight) hours as needed for nausea or vomiting. Patient not taking: Reported on 11/28/2014 08/26/14   Hilario Quarryanielle S Ray, MD  promethazine (PHENERGAN) 25 MG tablet Take 1 tablet (25 mg total) by mouth every 6 (six) hours as needed. 11/28/14   Vanetta MuldersScott Austan Nicholl, MD   BP 121/80 mmHg  Pulse 78  Temp(Src) 98.4 F (36.9 C) (Oral)  Resp 18  Ht 4\' 8"  (1.422 m)  SpO2 98%  LMP 11/17/2014 Physical Exam  Constitutional: She is oriented to person, place, and time. She appears well-developed and well-nourished. No distress.  HENT:  Head: Normocephalic and atraumatic.  Eyes: Conjunctivae and EOM are normal. Pupils are equal, round, and reactive to light.  Neck: Neck supple. No tracheal deviation present.  Cardiovascular: Normal rate, regular rhythm and normal heart sounds.   No murmur heard. Pulmonary/Chest: Effort normal. No respiratory distress. She has no wheezes. She has no rales.  Lungs clear bilaterally  Abdominal: Bowel sounds are normal. There is tenderness (RUQ). There is guarding.  Tenderness to RUQ with guarding  Musculoskeletal: She exhibits no edema.  No ankle swelling  Neurological: She is alert and oriented to person, place, and time. No cranial nerve deficit. She exhibits normal muscle tone. Coordination normal.  Skin: Skin is warm and dry.  Psychiatric: She has a normal mood and affect. Her behavior is normal.  Nursing note and vitals reviewed.   ED Course  Procedures (including critical care time) DIAGNOSTIC STUDIES: Oxygen Saturation is 100% on RA, normal by my interpretation.    COORDINATION OF CARE: 2:45 PM Discussed treatment plan with pt at bedside and pt agreed to plan.   Labs Review Labs Reviewed   COMPREHENSIVE METABOLIC PANEL - Abnormal; Notable for the following:    Potassium 3.6 (*)    Glucose, Bld 105 (*)    Total Protein 8.7 (*)    GFR calc non Af Amer 81 (*)    All other components within normal limits  CBC WITH DIFFERENTIAL - Abnormal; Notable for the following:    WBC 13.9 (*)    RBC 6.15 (*)    MCV 63.6 (*)    MCH 20.3 (*)    Platelets 424 (*)    Neutro Abs 9.9 (*)    All other components within normal limits  LIPASE, BLOOD   Results for orders placed or performed during the hospital encounter of 11/28/14  Lipase, blood  Result Value Ref Range   Lipase 21 11 - 59 U/L  Comprehensive metabolic panel  Result Value Ref Range   Sodium 140 137 - 147 mEq/L   Potassium 3.6 (L) 3.7 - 5.3 mEq/L   Chloride 100 96 - 112 mEq/L   CO2 25 19 - 32  mEq/L   Glucose, Bld 105 (H) 70 - 99 mg/dL   BUN 14 6 - 23 mg/dL   Creatinine, Ser 6.96 0.50 - 1.10 mg/dL   Calcium 29.5 8.4 - 28.4 mg/dL   Total Protein 8.7 (H) 6.0 - 8.3 g/dL   Albumin 4.8 3.5 - 5.2 g/dL   AST 22 0 - 37 U/L   ALT 15 0 - 35 U/L   Alkaline Phosphatase 79 39 - 117 U/L   Total Bilirubin 0.7 0.3 - 1.2 mg/dL   GFR calc non Af Amer 81 (L) >90 mL/min   GFR calc Af Amer >90 >90 mL/min   Anion gap 15 5 - 15  CBC with Differential  Result Value Ref Range   WBC 13.9 (H) 4.0 - 10.5 K/uL   RBC 6.15 (H) 3.87 - 5.11 MIL/uL   Hemoglobin 12.5 12.0 - 15.0 g/dL   HCT 13.2 44.0 - 10.2 %   MCV 63.6 (L) 78.0 - 100.0 fL   MCH 20.3 (L) 26.0 - 34.0 pg   MCHC 32.0 30.0 - 36.0 g/dL   RDW 72.5 36.6 - 44.0 %   Platelets 424 (H) 150 - 400 K/uL   Neutrophils Relative % 71 43 - 77 %   Neutro Abs 9.9 (H) 1.7 - 7.7 K/uL   Lymphocytes Relative 23 12 - 46 %   Lymphs Abs 3.2 0.7 - 4.0 K/uL   Monocytes Relative 5 3 - 12 %   Monocytes Absolute 0.7 0.1 - 1.0 K/uL   Eosinophils Relative 1 0 - 5 %   Eosinophils Absolute 0.1 0.0 - 0.7 K/uL   Basophils Relative 0 0 - 1 %   Basophils Absolute 0.1 0.0 - 0.1 K/uL   RBC Morphology  POLYCHROMASIA PRESENT      Imaging Review Dg Abd Acute W/chest  11/28/2014   CLINICAL DATA:  Upper abdominal pain, nausea vomiting for 1 day  EXAM: ACUTE ABDOMEN SERIES (ABDOMEN 2 VIEW & CHEST 1 VIEW)  COMPARISON:  08/26/2014  FINDINGS: Cardiac shadow is within normal limits. The lungs are clear bilaterally. Scattered large and small bowel gas is noted. No free air is seen. No mass lesion or abnormal calcifications are noted. No acute bony abnormality is seen.  IMPRESSION: No acute abnormality noted.   Electronically Signed   By: Alcide Clever M.D.   On: 11/28/2014 16:16     EKG Interpretation None      MDM   Final diagnoses:  Abdominal pain  Gastroenteritis    Patient symptoms could be consistent with a gastroenteritis. The workup for the abdominal pain without any significant findings. Occult blood is oriented removed. No evidence of pancreatitis. No evidence of any significant liver function test abnormalities. Patient improved here with fluids and pain medicine and antinausea medicine.     I personally performed the services described in this documentation, which was scribed in my presence. The recorded information has been reviewed and is accurate.      Vanetta Mulders, MD 11/28/14 2131

## 2014-11-28 NOTE — Discharge Instructions (Signed)
Symptoms most likely consistent with a viral gastroenteritis. Take the Phenergan and pain medication as directed. Return for any new or worse symptoms. Or if not improving in 24-48 hours.

## 2014-11-28 NOTE — ED Notes (Signed)
MD at bedside. 

## 2014-11-28 NOTE — ED Notes (Signed)
Patient c/o upper abd pain. Patient reports nausea, vomiting, and diarrhea x2 days. Denies any fevers or urinary symptoms.

## 2015-06-10 ENCOUNTER — Encounter (HOSPITAL_COMMUNITY): Payer: Self-pay | Admitting: Emergency Medicine

## 2015-06-10 ENCOUNTER — Emergency Department (HOSPITAL_COMMUNITY)
Admission: EM | Admit: 2015-06-10 | Discharge: 2015-06-10 | Disposition: A | Discharge status: Home / Self Care | Payer: Self-pay | Attending: Emergency Medicine | Admitting: Emergency Medicine

## 2015-06-10 DIAGNOSIS — Z79899 Other long term (current) drug therapy: Secondary | ICD-10-CM | POA: Insufficient documentation

## 2015-06-10 DIAGNOSIS — J45909 Unspecified asthma, uncomplicated: Secondary | ICD-10-CM | POA: Insufficient documentation

## 2015-06-10 DIAGNOSIS — R000 Tachycardia, unspecified: Secondary | ICD-10-CM | POA: Insufficient documentation

## 2015-06-10 DIAGNOSIS — Z7951 Long term (current) use of inhaled steroids: Secondary | ICD-10-CM | POA: Insufficient documentation

## 2015-06-10 DIAGNOSIS — Z8673 Personal history of transient ischemic attack (TIA), and cerebral infarction without residual deficits: Secondary | ICD-10-CM | POA: Insufficient documentation

## 2015-06-10 DIAGNOSIS — Z3202 Encounter for pregnancy test, result negative: Secondary | ICD-10-CM | POA: Insufficient documentation

## 2015-06-10 DIAGNOSIS — K529 Noninfective gastroenteritis and colitis, unspecified: Secondary | ICD-10-CM | POA: Insufficient documentation

## 2015-06-10 DIAGNOSIS — Z87891 Personal history of nicotine dependence: Secondary | ICD-10-CM | POA: Insufficient documentation

## 2015-06-10 DIAGNOSIS — Z792 Long term (current) use of antibiotics: Secondary | ICD-10-CM | POA: Insufficient documentation

## 2015-06-10 LAB — CBC WITH DIFFERENTIAL/PLATELET
BASOS ABS: 0.1 10*3/uL (ref 0.0–0.1)
BASOS PCT: 1 % (ref 0–1)
EOS ABS: 0.3 10*3/uL (ref 0.0–0.7)
Eosinophils Relative: 3 % (ref 0–5)
HCT: 35.7 % — ABNORMAL LOW (ref 36.0–46.0)
HEMOGLOBIN: 11.1 g/dL — AB (ref 12.0–15.0)
Lymphocytes Relative: 42 % (ref 12–46)
Lymphs Abs: 4.2 10*3/uL — ABNORMAL HIGH (ref 0.7–4.0)
MCH: 20 pg — AB (ref 26.0–34.0)
MCHC: 31.1 g/dL (ref 30.0–36.0)
MCV: 64.3 fL — AB (ref 78.0–100.0)
Monocytes Absolute: 0.6 10*3/uL (ref 0.1–1.0)
Monocytes Relative: 7 % (ref 3–12)
Neutro Abs: 4.7 10*3/uL (ref 1.7–7.7)
Neutrophils Relative %: 48 % (ref 43–77)
Platelets: 345 10*3/uL (ref 150–400)
RBC: 5.55 MIL/uL — AB (ref 3.87–5.11)
RDW: 15 % (ref 11.5–15.5)
WBC: 9.9 10*3/uL (ref 4.0–10.5)

## 2015-06-10 LAB — URINALYSIS, ROUTINE W REFLEX MICROSCOPIC
Bilirubin Urine: NEGATIVE
GLUCOSE, UA: NEGATIVE mg/dL
KETONES UR: NEGATIVE mg/dL
Nitrite: NEGATIVE
PROTEIN: NEGATIVE mg/dL
Specific Gravity, Urine: 1.01 (ref 1.005–1.030)
Urobilinogen, UA: 0.2 mg/dL (ref 0.0–1.0)
pH: 6 (ref 5.0–8.0)

## 2015-06-10 LAB — URINE MICROSCOPIC-ADD ON

## 2015-06-10 LAB — COMPREHENSIVE METABOLIC PANEL
ALT: 15 U/L (ref 14–54)
AST: 18 U/L (ref 15–41)
Albumin: 4.4 g/dL (ref 3.5–5.0)
Alkaline Phosphatase: 63 U/L (ref 38–126)
Anion gap: 9 (ref 5–15)
BUN: 9 mg/dL (ref 6–20)
CALCIUM: 9.4 mg/dL (ref 8.9–10.3)
CO2: 29 mmol/L (ref 22–32)
Chloride: 100 mmol/L — ABNORMAL LOW (ref 101–111)
Creatinine, Ser: 0.87 mg/dL (ref 0.44–1.00)
GFR calc non Af Amer: >60 mL/min (ref >60)
GLUCOSE: 104 mg/dL — AB (ref 65–99)
Potassium: 3.4 mmol/L — ABNORMAL LOW (ref 3.5–5.1)
SODIUM: 138 mmol/L (ref 135–145)
TOTAL PROTEIN: 7.7 g/dL (ref 6.5–8.1)
Total Bilirubin: 0.7 mg/dL (ref 0.3–1.2)

## 2015-06-10 LAB — PREGNANCY, URINE: PREG TEST UR: NEGATIVE

## 2015-06-10 MED ORDER — GI COCKTAIL ~~LOC~~
30.0000 mL | Freq: Once | ORAL | Status: AC
  Administered 2015-06-10: 30 mL via ORAL
  Filled 2015-06-10: qty 30

## 2015-06-10 MED ORDER — FAMOTIDINE IN NACL 20-0.9 MG/50ML-% IV SOLN
20.0000 mg | Freq: Once | INTRAVENOUS | Status: AC
  Administered 2015-06-10: 20 mg via INTRAVENOUS
  Filled 2015-06-10: qty 50

## 2015-06-10 MED ORDER — PROMETHAZINE HCL 25 MG/ML IJ SOLN
12.5000 mg | Freq: Once | INTRAMUSCULAR | Status: AC
  Administered 2015-06-10: 12.5 mg via INTRAVENOUS
  Filled 2015-06-10: qty 1

## 2015-06-10 MED ORDER — PROMETHAZINE HCL 25 MG PO TABS: 25.0000 mg | ORAL_TABLET | Freq: Four times a day (QID) | ORAL | Status: DC | PRN

## 2015-06-10 MED ORDER — SODIUM CHLORIDE 0.9 % IV SOLN
INTRAVENOUS | Status: DC
  Administered 2015-06-10: 17:00:00 via INTRAVENOUS

## 2015-06-10 NOTE — Discharge Instructions (Signed)
Stay on clear liquids today and then start the brat diet tomorrow. Return as needed.

## 2015-06-10 NOTE — ED Notes (Signed)
Pt reports vomiting and diarrhea since yesterday. 

## 2015-06-10 NOTE — ED Provider Notes (Signed)
CSN: [id number]     Arrival date & time 06/10/15  1623 History   First MD Initiated Contact with Patient 06/10/15 1650     Chief Complaint  Patient presents with  . Emesis     (Consider location/radiation/quality/duration/timing/severity/associated sxs/prior Treatment) Patient is a 39 y.o. female presenting with vomiting. The history is provided by the patient.  Emesis Severity:  Moderate Duration:  1 day Timing:  Intermittent Number of daily episodes:  20 Quality:  Stomach contents Progression:  Worsening Chronicity:  New Recent urination:  Normal Relieved by:  Nothing Worsened by:  Food smell Ineffective treatments:  None tried Associated symptoms: abdominal pain and diarrhea   Associated symptoms: no chills, no cough, no fever, no headaches, no myalgias, no sore throat and no URI   Diarrhea:    Quality:  Watery   Number of occurrences:  15   Severity:  Moderate   Duration:  1 day   Timing:  Intermittent   Progression:  Worsening  [name] is a 39 y.o. female who presents to the ED with n/v/d that started yesterday. She reports that every time she tries to eat or drink she vomits and has diarrhea. She reports abdominal cramping prior to diarrhea and feeling sore from vomiting so many times.   Past Medical History  Diagnosis Date  . Stroke   . Asthma    Past Surgical History  Procedure Laterality Date  . Cholecystectomy    . Bladder surgery    . Esophagogastroduodenoscopy N/A 04/10/2014    Procedure: ESOPHAGOGASTRODUODENOSCOPY (EGD);  Surgeon: Malissa Hippo, MD;  Location: [facility];  Service: Endoscopy;  Laterality: N/A;   Family History  Problem Relation Age of Onset  . Pancreatic cancer Mother   . Stroke Mother   . Diabetes Father   . Hypertension Father    History  Substance Use Topics  . Smoking status: Former Smoker -- 1.00 packs/day for 13 years    Types: Cigarettes    Quit date: 06/18/2013  . Smokeless tobacco: Never Used  . Alcohol  Use: No   OB History    Gravida Para Term Preterm AB TAB SAB Ectopic Multiple Living   3 2 2  1  1         Review of Systems  Constitutional: Negative for fever and chills.  HENT: Negative.  Negative for sore throat.   Eyes: Negative for pain, itching and visual disturbance.  Respiratory: Negative for cough, shortness of breath and wheezing.   Cardiovascular: Negative for chest pain and leg swelling.  Gastrointestinal: Positive for nausea, vomiting, abdominal pain and diarrhea.  Genitourinary: Negative for dysuria, urgency and frequency.  Musculoskeletal: Negative for myalgias and back pain.  Skin: Negative for rash.  Neurological: Negative for syncope, light-headedness and headaches.  Psychiatric/Behavioral: Negative for confusion. The patient is not nervous/anxious.       Allergies  Review of patient's allergies indicates no known allergies.  Home Medications   Prior to Admission medications   Medication Sig Start Date End Date Taking? Authorizing Provider  fluticasone (FLONASE) 50 MCG/ACT nasal spray Place into both nostrils daily.   Yes Historical Provider, MD  ranitidine (ZANTAC) 150 MG tablet Take 150 mg by mouth daily.   Yes Historical Provider, MD  albuterol (PROVENTIL HFA;VENTOLIN HFA) 108 (90 BASE) MCG/ACT inhaler Inhale 2 puffs into the lungs every 6 (six) hours as needed for wheezing or shortness of breath.    Historical Provider, MD  ciprofloxacin (CIPRO) 500 MG tablet Take  1 tablet (500 mg total) by mouth 2 (two) times daily. Patient not taking: Reported on 06/10/2015 11/23/14   Benjiman CoreNathan Pickering, MD  HYDROcodone-acetaminophen (NORCO/VICODIN) 5-325 MG per tablet Take 1-2 tablets by mouth every 6 (six) hours as needed. Patient not taking: Reported on 06/10/2015 11/28/14   Vanetta MuldersScott Zackowski, MD  ibuprofen (ADVIL,MOTRIN) 200 MG tablet Take 600 mg by mouth every 6 (six) hours as needed for moderate pain.    Historical Provider, MD  LORazepam (ATIVAN) 0.5 MG tablet Take 2  tablets (1 mg total) by mouth every 8 (eight) hours as needed for anxiety. Patient not taking: Reported on 06/10/2015 06/04/14   Bethann BerkshireJoseph Zammit, MD  omeprazole (PRILOSEC) 20 MG capsule Take 1 capsule (20 mg total) by mouth 2 (two) times daily before a meal. Patient not taking: Reported on 06/10/2015 04/14/14   Standley Brookinganiel P Goodrich, MD  ondansetron (ZOFRAN ODT) 8 MG disintegrating tablet Take 1 tablet (8 mg total) by mouth every 8 (eight) hours as needed for nausea or vomiting. Patient not taking: Reported on 11/28/2014 08/26/14   Margarita Grizzleanielle Ray, MD  oxyCODONE-acetaminophen (PERCOCET/ROXICET) 5-325 MG per tablet Take 1 tablet by mouth every 4 (four) hours as needed for severe pain. Patient not taking: Reported on 06/10/2015 11/23/14   Benjiman CoreNathan Pickering, MD  promethazine (PHENERGAN) 25 MG tablet Take 1 tablet (25 mg total) by mouth every 6 (six) hours as needed for nausea or vomiting. 06/10/15   Nioka Thorington Orlene OchM Aretha Levi, NP   BP 128/81 mmHg  Pulse 78  Temp(Src) 98.3 F (36.8 C) (Oral)  Resp 16  Ht 4\' 9"  (1.448 m)  Wt 129 lb (58.514 kg)  BMI 27.91 kg/m2  SpO2 99%  LMP 05/19/2015 Physical Exam  Constitutional: She is oriented to person, place, and time. She appears well-developed and well-nourished.  HENT:  Head: Normocephalic.  Eyes: Conjunctivae and EOM are normal. Pupils are equal, round, and reactive to light.  Neck: Normal range of motion. Neck supple.  Cardiovascular: Regular rhythm.  Tachycardia present.   Pulmonary/Chest: Effort normal. She has no wheezes. She has no rales.  Abdominal: Soft. Bowel sounds are normal. There is tenderness in the epigastric area. There is no rebound, no guarding and no CVA tenderness.  Musculoskeletal: Normal range of motion.  Neurological: She is alert and oriented to person, place, and time. No cranial nerve deficit.  Skin: Skin is warm and dry.  Psychiatric: She has a normal mood and affect. Her behavior is normal.  Nursing note and vitals reviewed.   ED Course   Procedures (including critical care time) Labs Review Results for orders placed or performed during the hospital encounter of 06/10/15 (from the past 24 hour(s))  Urinalysis, Routine w reflex microscopic (not [facility])     Status: Abnormal   Collection Time: 06/10/15  4:40 PM  Result Value Ref Range   Color, Urine YELLOW YELLOW   APPearance HAZY (A) CLEAR   Specific Gravity, Urine 1.010 1.005 - 1.030   pH 6.0 5.0 - 8.0   Glucose, UA NEGATIVE NEGATIVE mg/dL   Hgb urine dipstick MODERATE (A) NEGATIVE   Bilirubin Urine NEGATIVE NEGATIVE   Ketones, ur NEGATIVE NEGATIVE mg/dL   Protein, ur NEGATIVE NEGATIVE mg/dL   Urobilinogen, UA 0.2 0.0 - 1.0 mg/dL   Nitrite NEGATIVE NEGATIVE   Leukocytes, UA TRACE (A) NEGATIVE  Pregnancy, urine     Status: None   Collection Time: 06/10/15  4:40 PM  Result Value Ref Range   Preg Test, Ur NEGATIVE NEGATIVE  Urine  microscopic-add on     Status: Abnormal   Collection Time: 06/10/15  4:40 PM  Result Value Ref Range   Squamous Epithelial / LPF MANY (A) RARE   WBC, UA 0-2 <3 WBC/hpf   RBC / HPF 3-6 <3 RBC/hpf   Bacteria, UA FEW (A) RARE  CBC with Differential     Status: Abnormal   Collection Time: 06/10/15  4:57 PM  Result Value Ref Range   WBC 9.9 4.0 - 10.5 K/uL   RBC 5.55 (H) 3.87 - 5.11 MIL/uL   Hemoglobin 11.1 (L) 12.0 - 15.0 g/dL   HCT 16.1 (L) 09.6 - 04.5 %   MCV 64.3 (L) 78.0 - 100.0 fL   MCH 20.0 (L) 26.0 - 34.0 pg   MCHC 31.1 30.0 - 36.0 g/dL   RDW 40.9 81.1 - 91.4 %   Platelets 345 150 - 400 K/uL   Neutrophils Relative % 48 43 - 77 %   Neutro Abs 4.7 1.7 - 7.7 K/uL   Lymphocytes Relative 42 12 - 46 %   Lymphs Abs 4.2 (H) 0.7 - 4.0 K/uL   Monocytes Relative 7 3 - 12 %   Monocytes Absolute 0.6 0.1 - 1.0 K/uL   Eosinophils Relative 3 0 - 5 %   Eosinophils Absolute 0.3 0.0 - 0.7 K/uL   Basophils Relative 1 0 - 1 %   Basophils Absolute 0.1 0.0 - 0.1 K/uL  Comprehensive metabolic panel     Status: Abnormal   Collection Time:  06/10/15  4:57 PM  Result Value Ref Range   Sodium 138 135 - 145 mmol/L   Potassium 3.4 (L) 3.5 - 5.1 mmol/L   Chloride 100 (L) 101 - 111 mmol/L   CO2 29 22 - 32 mmol/L   Glucose, Bld 104 (H) 65 - 99 mg/dL   BUN 9 6 - 20 mg/dL   Creatinine, Ser 7.82 0.44 - 1.00 mg/dL   Calcium 9.4 8.9 - 95.6 mg/dL   Total Protein 7.7 6.5 - 8.1 g/dL   Albumin 4.4 3.5 - 5.0 g/dL   AST 18 15 - 41 U/L   ALT 15 14 - 54 U/L   Alkaline Phosphatase 63 38 - 126 U/L   Total Bilirubin 0.7 0.3 - 1.2 mg/dL   GFR calc non Af Amer >60 >60 mL/min   GFR calc Af Amer >60 >60 mL/min   Anion gap 9 5 - 15   Patient felt much better after IV hydration and medication for nausea and pepcid for epigastric burning. Stable for d/c and does not appear dehydrated.   MDM  38 y.o. female with n/v/d that started 24 hours prior to ED visit. Will d/c home with Phenergan for nausea and she will take her Zantac she has at home. She will stay on clear liquids for the next 24 hours and then advance to the [employer] Discussed with the patient and her family plan of care. All questioned fully answered. She will return if any problems arise.   Final diagnoses:  Gastroenteritis, acute      [facility], NP 06/11/15 1511  Zadie Rhine, MD 06/12/15 [phone]

## 2015-11-18 ENCOUNTER — Emergency Department (HOSPITAL_COMMUNITY)
Admission: EM | Admit: 2015-11-18 | Discharge: 2015-11-18 | Disposition: A | Discharge status: Home / Self Care | Payer: Self-pay | Attending: Emergency Medicine | Admitting: Emergency Medicine

## 2015-11-18 ENCOUNTER — Encounter (HOSPITAL_COMMUNITY): Payer: Self-pay | Admitting: *Deleted

## 2015-11-18 DIAGNOSIS — K0889 Other specified disorders of teeth and supporting structures: Secondary | ICD-10-CM | POA: Insufficient documentation

## 2015-11-18 DIAGNOSIS — Z7951 Long term (current) use of inhaled steroids: Secondary | ICD-10-CM | POA: Insufficient documentation

## 2015-11-18 DIAGNOSIS — J45909 Unspecified asthma, uncomplicated: Secondary | ICD-10-CM | POA: Insufficient documentation

## 2015-11-18 DIAGNOSIS — F17210 Nicotine dependence, cigarettes, uncomplicated: Secondary | ICD-10-CM | POA: Insufficient documentation

## 2015-11-18 DIAGNOSIS — Z8673 Personal history of transient ischemic attack (TIA), and cerebral infarction without residual deficits: Secondary | ICD-10-CM | POA: Insufficient documentation

## 2015-11-18 DIAGNOSIS — K029 Dental caries, unspecified: Secondary | ICD-10-CM | POA: Insufficient documentation

## 2015-11-18 DIAGNOSIS — Z79899 Other long term (current) drug therapy: Secondary | ICD-10-CM | POA: Insufficient documentation

## 2015-11-18 DIAGNOSIS — K047 Periapical abscess without sinus: Secondary | ICD-10-CM | POA: Insufficient documentation

## 2015-11-18 MED ORDER — PENICILLIN V POTASSIUM 500 MG PO TABS: 500.0000 mg | ORAL_TABLET | Freq: Four times a day (QID) | ORAL | Status: AC

## 2015-11-18 NOTE — Discharge Instructions (Signed)
Take motrin and tylenol for pain. Please follow-up with dentist for definitive management. Return for worsening symptoms, including worsening swelling despite antibiotics, fevers, difficulty swallowing or breathing, or any other symptoms concerning to you.  Dental Abscess A dental abscess is pus in or around a tooth. HOME CARE  Take medicines only as told by your dentist.  If you were prescribed antibiotic medicine, finish all of it even if you start to feel better.  Rinse your mouth (gargle) often with salt water.  Do not drive or use heavy machinery, like a lawn mower, while taking pain medicine.  Do not apply heat to the outside of your mouth.  Keep all follow-up visits as told by your dentist. This is important. GET HELP IF:  Your pain is worse, and medicine does not help. GET HELP RIGHT AWAY IF:  You have a fever or chills.  Your symptoms suddenly get worse.  You have a very bad headache.  You have problems breathing or swallowing.  You have trouble opening your mouth.  You have puffiness (swelling) in your neck or around your eye.   This information is not intended to replace advice given to you by your health care provider. Make sure you discuss any questions you have with your health care provider.   Document Released: 04/15/2015 Document Reviewed: 04/15/2015 Elsevier Interactive Patient Education [employer]2016 [employer]

## 2015-11-18 NOTE — ED Provider Notes (Signed)
CSN: [id number]     Arrival date & time 11/18/15  0915 History  By signing my name below, I, [name], attest that this documentation has been prepared under the direction and in the presence of Lavera Guise, MD. Electronically Signed: [name], ED Scribe. 11/18/2015. 9:33 AM.  Chief Complaint  Patient presents with  . Dental Pain   The history is provided by the patient. No language interpreter was used.   PCP: No PCP Per Patient HPI Comments: [name] is a 39 y.o. female, with PMHx noted below, who presents to the Emergency Department complaining of 10/10, constant, worsening right lower dental pain with associated swelling onset at 12AM this morning. Pt reports taking ibuprofen at home, with last dose ingested at 2AM this morning, without relief. Pt denies having a dentist. Pt denies fever, trouble swallowing, or any other Sx at this time.   Past Medical History  Diagnosis Date  . Stroke (HCC)   . Asthma    Past Surgical History  Procedure Laterality Date  . Cholecystectomy    . Bladder surgery    . Esophagogastroduodenoscopy N/A 04/10/2014    Procedure: ESOPHAGOGASTRODUODENOSCOPY (EGD);  Surgeon: Malissa Hippo, MD;  [facility];  Service: Endoscopy;  Laterality: N/A;   Family History  Problem Relation Age of Onset  . Pancreatic cancer Mother   . Stroke Mother   . Diabetes Father   . Hypertension Father    Social History  Substance Use Topics  . Smoking status: Current Every Day Smoker -- 1.00 packs/day for 13 years    Types: Cigarettes  . Smokeless tobacco: Never Used  . Alcohol Use: No   OB History    Gravida Para Term Preterm AB TAB SAB Ectopic Multiple Living   Review of Systems  Constitutional: Negative for fever.  HENT: Positive for dental problem. Negative for trouble swallowing.   All other systems reviewed and are negative.  Allergies  Review of patient's allergies indicates no known allergies.  Home  Medications   Prior to Admission medications   Medication Sig Start Date End Date Taking? Authorizing Provider  albuterol (PROVENTIL HFA;VENTOLIN HFA) 108 (90 BASE) MCG/ACT inhaler Inhale 2 puffs into the lungs every 6 (six) hours as needed for wheezing or shortness of breath.   Yes Historical Provider, MD  fluticasone (FLONASE) 50 MCG/ACT nasal spray Place 1 spray into both nostrils daily.    Yes Historical Provider, MD  ibuprofen (ADVIL,MOTRIN) 200 MG tablet Take 600 mg by mouth every 6 (six) hours as needed for moderate pain.   Yes Historical Provider, MD  penicillin v potassium (VEETID) 500 MG tablet Take 1 tablet (500 mg total) by mouth 4 (four) times daily. 11/18/15 11/25/15  Lavera Guise, MD  promethazine (PHENERGAN) 25 MG tablet Take 1 tablet (25 mg total) by mouth every 6 (six) hours as needed for nausea or vomiting. Patient not taking: Reported on 11/18/2015 06/10/15   Janne Napoleon, NP   Triage Vitals: BP 137/91 mmHg  Pulse 83  Temp(Src) 98 F (36.7 C) (Oral)  Resp 16  Ht  (1.448 m)  Wt 118 lb (53.524 kg)  BMI 25.53 kg/m2  SpO2 99% Physical Exam  Constitutional: She is oriented to person, place, and time. She appears well-developed and well-nourished.  HENT:  Head: Normocephalic.  Mouth/Throat: Uvula is midline, oropharynx is clear and moist and mucous membranes are normal.  Edentulous in upper molars, lower molars and premolars. Gingival swelling and redness to right lower side. Poor dentition throughout with extensive decay.   Eyes: EOM are normal.  Neck: Normal range of motion.  Cardiovascular: Normal rate, regular rhythm and normal heart sounds.   Pulmonary/Chest: Effort normal and breath sounds normal. No respiratory distress.  Abdominal: Soft. She exhibits no distension. There is no tenderness.  Musculoskeletal: Normal range of motion.  Neurological: She is alert and oriented to person, place, and time.  Psychiatric: She has a normal mood and affect.  Nursing note  and vitals reviewed.  ED Course  Procedures (including critical care time) DIAGNOSTIC STUDIES: Oxygen Saturation is 99% on RA, nl by my interpretation.    COORDINATION OF CARE: 9:30 AM: Discussed treatment plan which includes dental referral, motrin/tylenol for pain management and antibiotics with pt at bedside; patient verbalizes understanding and agrees with treatment plan.  MDM   Final diagnoses:  Pain, dental  Dental abscess    Patient with dental pain and abscess around teeth 28 and 29. Very poor dentition overall. Posterior oropharynx and submandibular space is clear. No suspicion of deep soft tissue neck infection. Given dental referral as well as prescription for penicillin. Discussed pain control with Tylenol and Motrin. Strict return and follow-up instructions reviewed. She expressed understanding of all discharge instructions and felt comfortable with the plan of care.     I personally performed the services described in this documentation, which was scribed in my presence. The recorded information has been reviewed and is accurate.     Lavera Guiseana Duo Kateria Cutrona, MD 11/18/15 [phone]

## 2015-11-18 NOTE — ED Notes (Signed)
Pt states she is having right lower dental pain starting in the middle of the night. Pt does not have a dentist.

## 2016-01-11 IMAGING — CT CT ABD-PELV W/ CM
2 of 4 series · 17 of 46 positions shown, 19 images · IV contrast (Omnipaque 300)
Comparison: CT scan of July 12, 2014.

CLINICAL DATA: Left lower quadrant abdominal pain.

EXAM:
CT ABDOMEN AND PELVIS WITH CONTRAST
TECHNIQUE: Multidetector CT imaging of the abdomen and pelvis was performed
using the standard protocol following bolus administration of
intravenous contrast.
CONTRAST:  100mL OMNIPAQUE IOHEXOL 300 MG/ML  SOLN

[Series 2: abd_pel_with 5.0 b40f · axial · 0.62mm/px · z∈[-424,-49]mm · 14 of 83 slices shown, 16 images]
[im 4/83  soft-tissue]
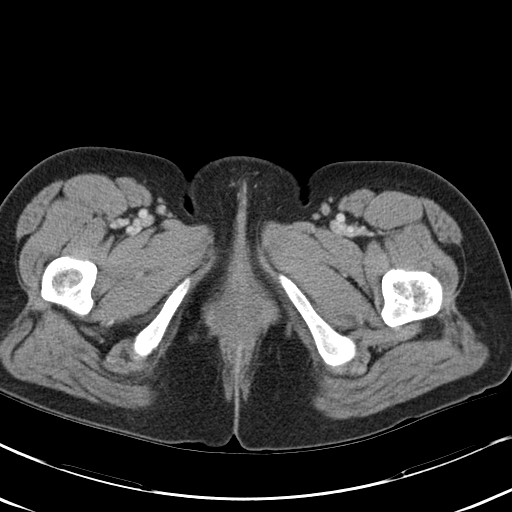
[im 4/83  bone]
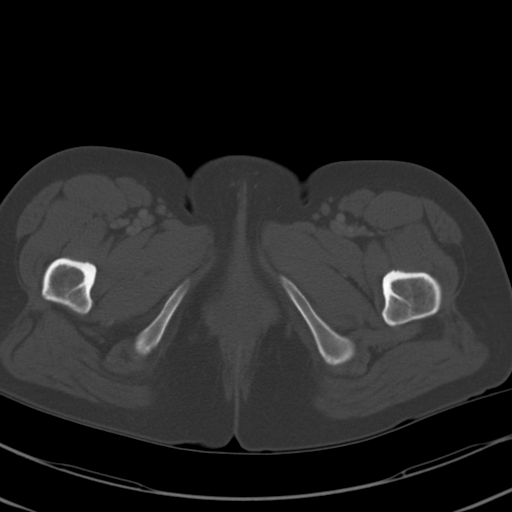
[im 10/83  soft-tissue]
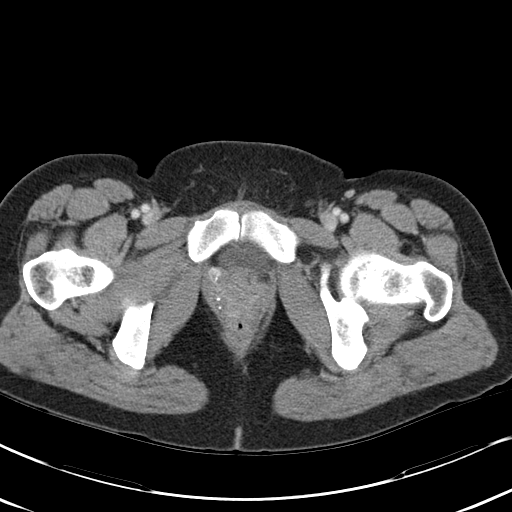
[im 17/83  soft-tissue]
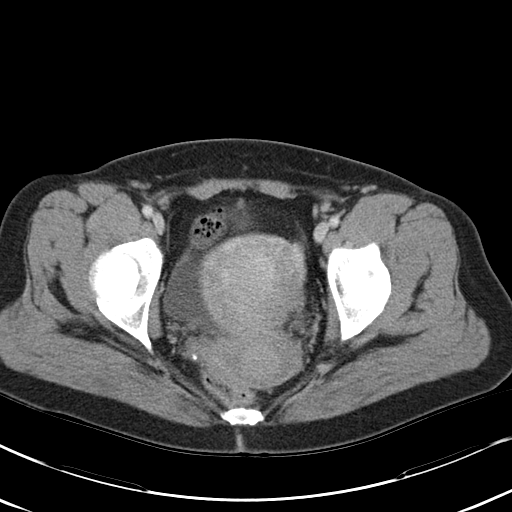
[im 23/83  soft-tissue]
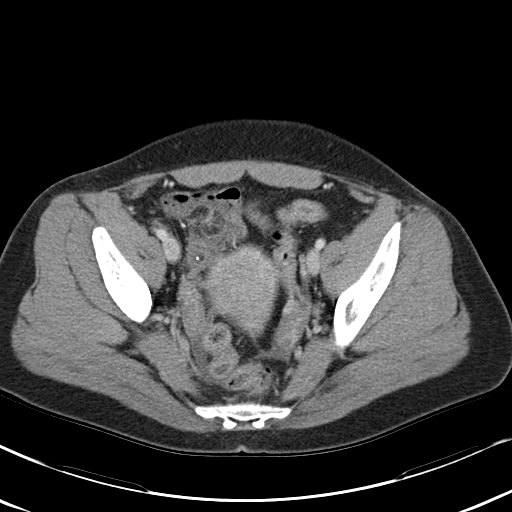
[im 27/83  soft-tissue]
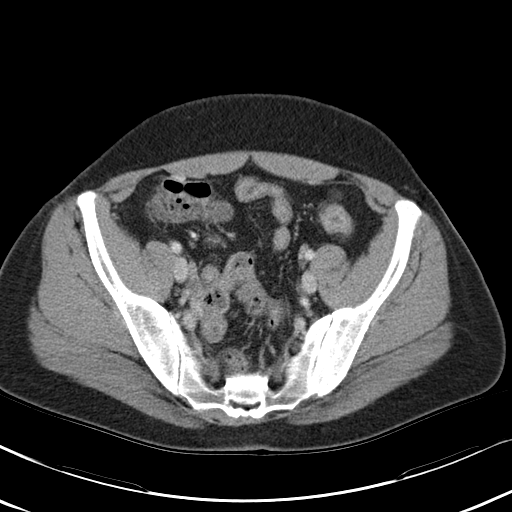
[im 33/83  soft-tissue]
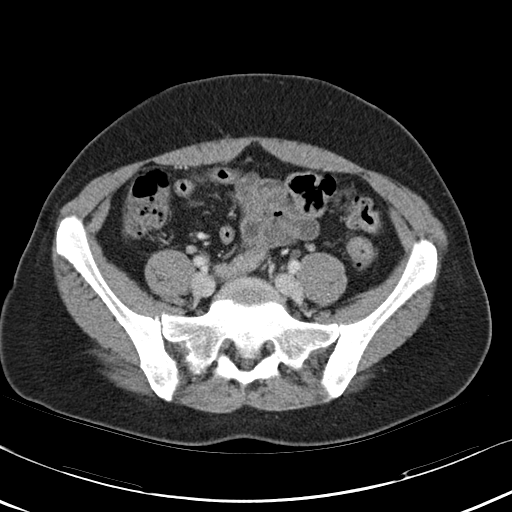
[im 40/83  soft-tissue]
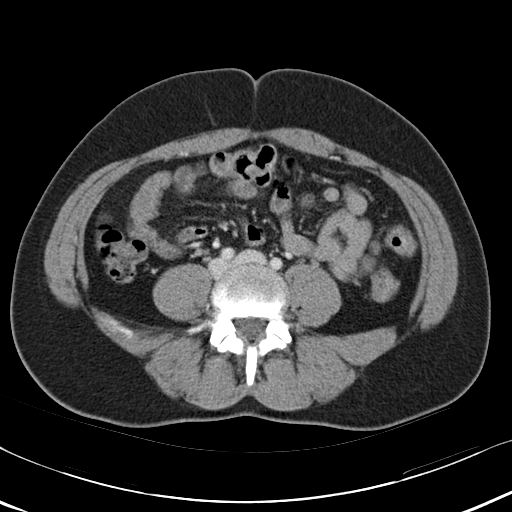
[im 43/83  soft-tissue]
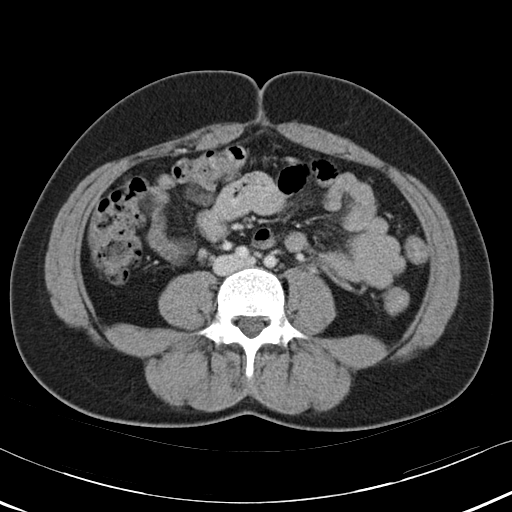
[im 50/83  soft-tissue]
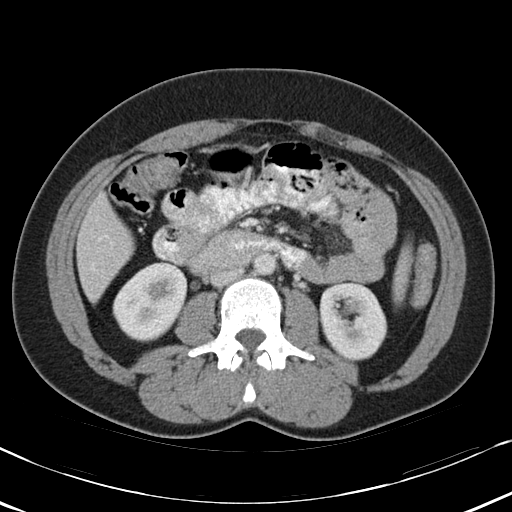
[im 50/83  bone]
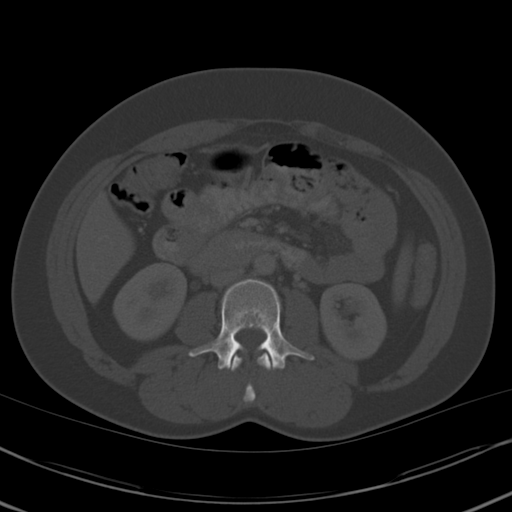
[im 56/83  soft-tissue]
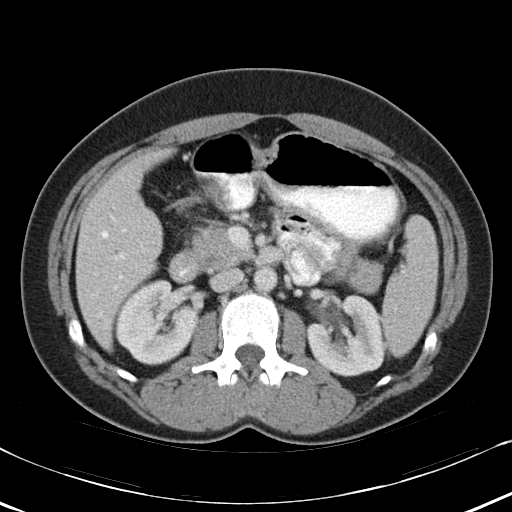
[im 63/83  soft-tissue]
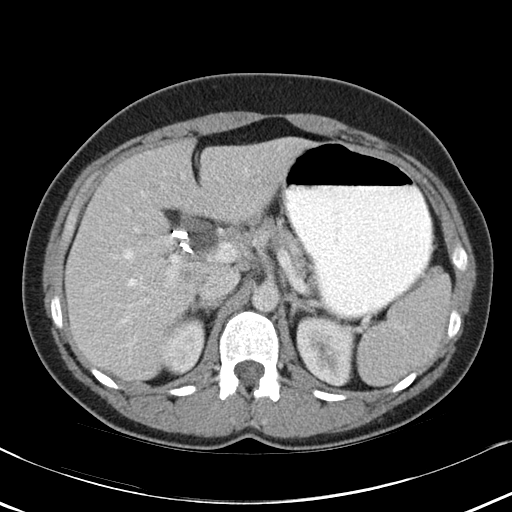
[im 66/83  soft-tissue]
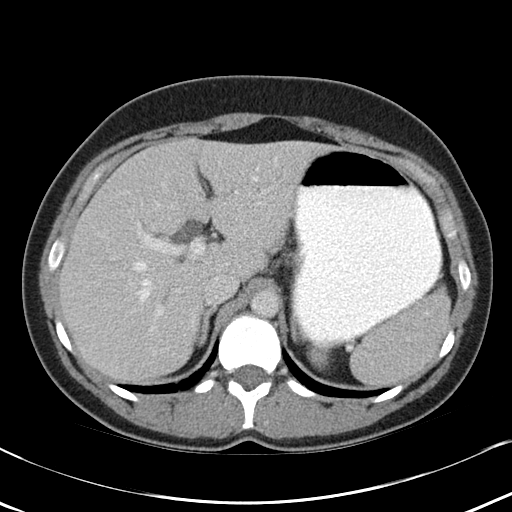
[im 73/83  soft-tissue]
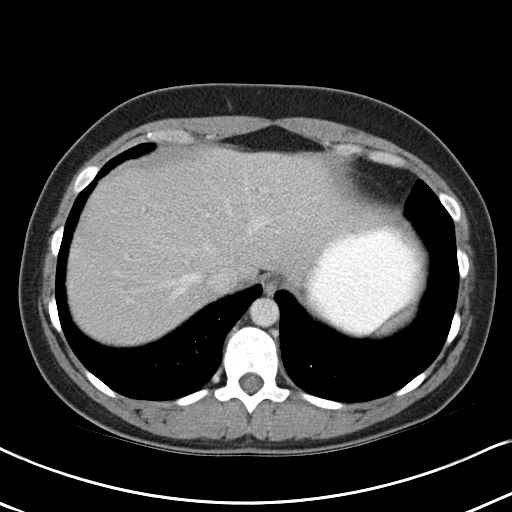
[im 79/83  soft-tissue]
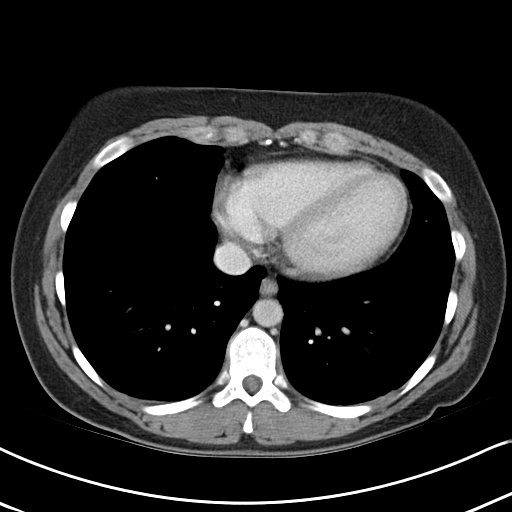

[Series 4: abd_pel_with 3.0 spo cor · coronal · 0.69mm/px · 3 of 63 slices shown]
[im 21/63  soft-tissue]
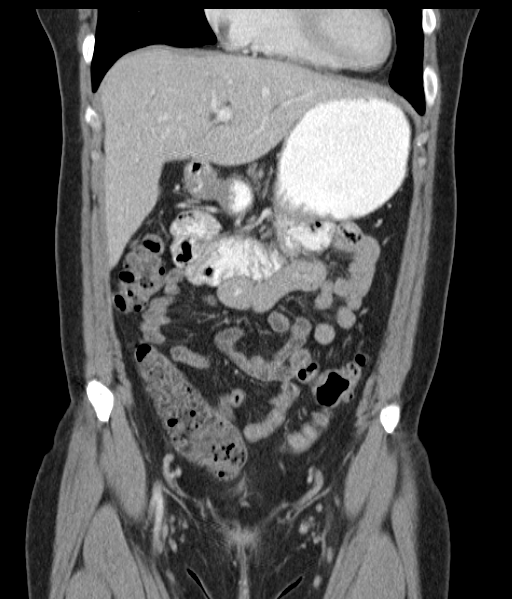
[im 28/63  soft-tissue]
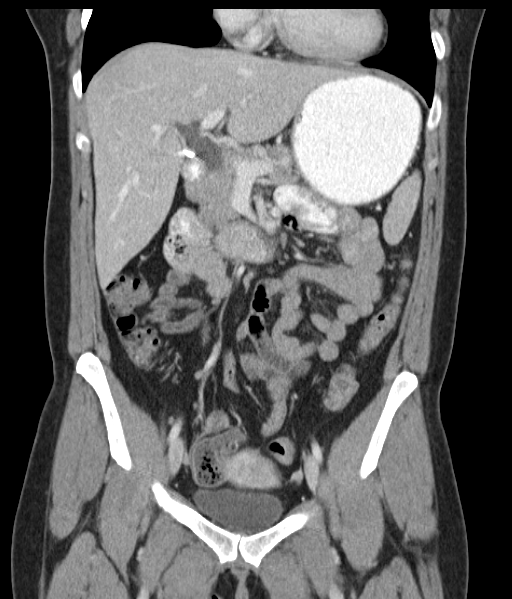
[im 35/63  soft-tissue]
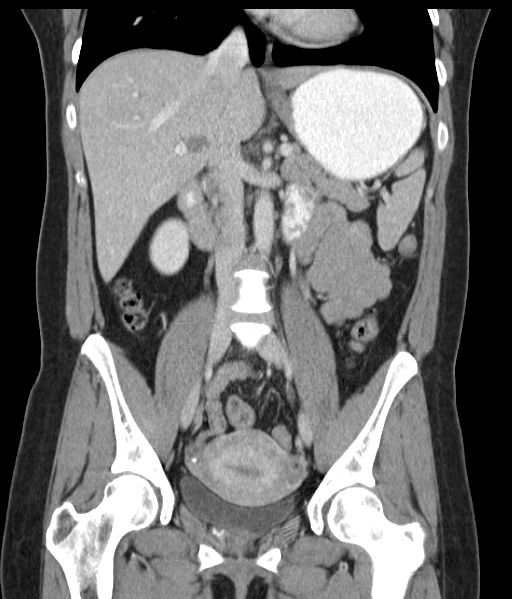

[17 of 46 positions shown; findings below may reference images not displayed]

FINDINGS: Visualized lung bases appear normal. No significant osseous
abnormality is noted.

Status post cholecystectomy. Stable dilatation of the intrahepatic
and extrahepatic bile ducts is noted which is unchanged compared to
prior exam and most likely related to post cholecystectomy status.
Otherwise, the liver, spleen and pancreas appear normal. Adrenal
glands and kidneys appear normal. No hydronephrosis or renal
obstruction is noted. No renal or ureteral calculi are noted. The
appendix appears normal. Uterus and urinary bladder appear normal.
There is no evidence of bowel obstruction. No abnormal fluid
collection is noted.
IMPRESSION: Status post cholecystectomy. No acute abnormality seen in the
abdomen or pelvis.

## 2021-08-18 ENCOUNTER — Other Ambulatory Visit: Payer: Self-pay

## 2021-08-18 ENCOUNTER — Ambulatory Visit
Admission: EM | Admit: 2021-08-18 | Discharge: 2021-08-18 | Disposition: A | Discharge status: Home / Self Care | Payer: Self-pay | Attending: Family Medicine | Admitting: Family Medicine

## 2021-08-18 ENCOUNTER — Encounter: Payer: Self-pay | Admitting: Emergency Medicine

## 2021-08-18 ENCOUNTER — Ambulatory Visit: Admit: 2021-08-18 | Payer: Self-pay

## 2021-08-18 DIAGNOSIS — L720 Epidermal cyst: Secondary | ICD-10-CM

## 2021-08-18 MED ORDER — DOXYCYCLINE HYCLATE 100 MG PO CAPS: 100.0000 mg | ORAL_CAPSULE | Freq: Two times a day (BID) | ORAL | 0 refills | Status: DC

## 2021-08-18 NOTE — ED Provider Notes (Signed)
  [facility]   [id number] 08/18/21 Arrival Time: 1800  ASSESSMENT & PLAN:  1. Epidermoid cyst of neck    Recommend I&D. Declines. Prefers trial of antibiotic first. Begin: Meds ordered this encounter  Medications   doxycycline (VIBRAMYCIN) 100 MG capsule    Sig: Take 1 capsule (100 mg total) by mouth 2 (two) times daily.    Dispense:  20 capsule    Refill:  0    Follow-up Information     [facility].   Specialty: Urgent Care Why: If worsening or failing to improve as anticipated. Contact information: [address] [place] [address] [phone]                 Reviewed expectations re: course of current medical issues. Questions answered. Outlined signs and symptoms indicating need for more acute intervention. Patient verbalized understanding. After Visit Summary given.   SUBJECTIVE:  [name] is a 45 y.o. female who presents with a bump; L neck; several days; slight drainage/bleeding today; is tender. Afebrile. No h/o similar. No tx PTA.  OBJECTIVE:  Vitals:   08/18/21 1923 08/18/21 1924  BP:  127/74  Pulse: 76   Resp: 18   Temp: 98 F (36.7 C)   TempSrc: Oral   SpO2: 98%      General appearance: alert; no distress Neck: approx 1.5 cm induration of her L post neck; tender to touch; no active drainage or bleeding; with overlying erythema; FROM s LAD Psychological: alert and cooperative; normal mood and affect  No Known Allergies  Past Medical History:  Diagnosis Date   Asthma    Stroke [facility])    Social History   Socioeconomic History   Marital status: Widowed    Spouse name: Not on file   Number of children: Not on file   Years of education: Not on file   Highest education level: Not on file  Occupational History   Not on file  Tobacco Use   Smoking status: Every Day    Packs/day: 1.00    Years: 13.00    Pack years: 13.00    Types: Cigarettes   Smokeless  tobacco: Never  Substance and Sexual Activity   Alcohol use: No   Drug use: No   Sexual activity: Never    Birth control/protection: None  Other Topics Concern   Not on file  Social History Narrative   Not on file   Social Determinants of Health   Financial Resource Strain: Not on file  Food Insecurity: Not on file  Transportation Needs: Not on file  Physical Activity: Not on file  Stress: Not on file  Social Connections: Not on file   Family History  Problem Relation Age of Onset   Pancreatic cancer Mother    Stroke Mother    Diabetes Father    Hypertension Father    Past Surgical History:  Procedure Laterality Date   BLADDER SURGERY     CHOLECYSTECTOMY     ESOPHAGOGASTRODUODENOSCOPY N/A 04/10/2014   Procedure: ESOPHAGOGASTRODUODENOSCOPY (EGD);  Surgeon: Malissa Hippo, MD;  Location[facility];  Service: Endoscopy;  Laterality: N/AMardella Layman, MD 08/18/21 [phone]

## 2021-09-04 ENCOUNTER — Encounter (HOSPITAL_COMMUNITY): Payer: Self-pay

## 2021-09-04 ENCOUNTER — Other Ambulatory Visit: Payer: Self-pay

## 2021-09-04 ENCOUNTER — Ambulatory Visit
Admission: EM | Admit: 2021-09-04 | Discharge: 2021-09-04 | Disposition: A | Discharge status: Home / Self Care | Payer: Self-pay | Attending: Emergency Medicine | Admitting: Emergency Medicine

## 2021-09-04 ENCOUNTER — Emergency Department (HOSPITAL_COMMUNITY): Payer: Self-pay

## 2021-09-04 ENCOUNTER — Emergency Department (HOSPITAL_COMMUNITY)
Admission: EM | Admit: 2021-09-04 | Discharge: 2021-09-04 | Disposition: A | Discharge status: Home / Self Care | Payer: Self-pay | Attending: Emergency Medicine | Admitting: Emergency Medicine

## 2021-09-04 ENCOUNTER — Encounter: Payer: Self-pay | Admitting: Emergency Medicine

## 2021-09-04 DIAGNOSIS — R300 Dysuria: Secondary | ICD-10-CM

## 2021-09-04 DIAGNOSIS — R309 Painful micturition, unspecified: Secondary | ICD-10-CM | POA: Insufficient documentation

## 2021-09-04 DIAGNOSIS — R109 Unspecified abdominal pain: Secondary | ICD-10-CM | POA: Insufficient documentation

## 2021-09-04 DIAGNOSIS — R3912 Poor urinary stream: Secondary | ICD-10-CM | POA: Insufficient documentation

## 2021-09-04 DIAGNOSIS — F17210 Nicotine dependence, cigarettes, uncomplicated: Secondary | ICD-10-CM | POA: Insufficient documentation

## 2021-09-04 DIAGNOSIS — J45909 Unspecified asthma, uncomplicated: Secondary | ICD-10-CM | POA: Insufficient documentation

## 2021-09-04 LAB — BASIC METABOLIC PANEL
Anion gap: 7 (ref 5–15)
BUN: 11 mg/dL (ref 6–20)
CO2: 27 mmol/L (ref 22–32)
Calcium: 8.9 mg/dL (ref 8.9–10.3)
Chloride: 105 mmol/L (ref 98–111)
Creatinine, Ser: 0.76 mg/dL (ref 0.44–1.00)
GFR, Estimated: >60 mL/min (ref >60)
Glucose, Bld: 98 mg/dL (ref 70–99)
Potassium: 3.2 mmol/L — ABNORMAL LOW (ref 3.5–5.1)
Sodium: 139 mmol/L (ref 135–145)

## 2021-09-04 LAB — POCT URINALYSIS DIP (MANUAL ENTRY)
Glucose, UA: NEGATIVE mg/dL
Nitrite, UA: POSITIVE — AB
Protein Ur, POC: 30 mg/dL — AB
Spec Grav, UA: >=1.03 — AB (ref 1.010–1.025)
Urobilinogen, UA: 0.2 E.U./dL
pH, UA: 5.5 (ref 5.0–8.0)

## 2021-09-04 LAB — CBC WITH DIFFERENTIAL/PLATELET
Abs Immature Granulocytes: 0.04 10*3/uL (ref 0.00–0.07)
Basophils Absolute: 0.1 10*3/uL (ref 0.0–0.1)
Basophils Relative: 1 %
Eosinophils Absolute: 0 10*3/uL (ref 0.0–0.5)
Eosinophils Relative: 0 %
HCT: 36.1 % (ref 36.0–46.0)
Hemoglobin: 11.2 g/dL — ABNORMAL LOW (ref 12.0–15.0)
Immature Granulocytes: 0 %
Lymphocytes Relative: 27 %
Lymphs Abs: 2.7 10*3/uL (ref 0.7–4.0)
MCH: 20.1 pg — ABNORMAL LOW (ref 26.0–34.0)
MCHC: 31 g/dL (ref 30.0–36.0)
MCV: 64.7 fL — ABNORMAL LOW (ref 80.0–100.0)
Monocytes Absolute: 0.6 10*3/uL (ref 0.1–1.0)
Monocytes Relative: 6 %
Neutro Abs: 6.5 10*3/uL (ref 1.7–7.7)
Neutrophils Relative %: 66 %
Platelets: 373 10*3/uL (ref 150–400)
RBC: 5.58 MIL/uL — ABNORMAL HIGH (ref 3.87–5.11)
RDW: 14.4 % (ref 11.5–15.5)
WBC: 9.9 10*3/uL (ref 4.0–10.5)
nRBC: 0 % (ref 0.0–0.2)

## 2021-09-04 LAB — URINALYSIS, ROUTINE W REFLEX MICROSCOPIC
Bacteria, UA: NONE SEEN
Bilirubin Urine: NEGATIVE
Glucose, UA: NEGATIVE mg/dL
Ketones, ur: 5 mg/dL — AB
Leukocytes,Ua: NEGATIVE
Nitrite: NEGATIVE
Protein, ur: NEGATIVE mg/dL
Specific Gravity, Urine: 1.009 (ref 1.005–1.030)
pH: 6 (ref 5.0–8.0)

## 2021-09-04 LAB — POC URINE PREG, ED: Preg Test, Ur: NEGATIVE

## 2021-09-04 MED ORDER — OXYCODONE-ACETAMINOPHEN 5-325 MG PO TABS
1.0000 | ORAL_TABLET | Freq: Once | ORAL | Status: AC
  Administered 2021-09-04: 1 via ORAL
  Filled 2021-09-04: qty 1

## 2021-09-04 MED ORDER — OXYCODONE-ACETAMINOPHEN 5-325 MG PO TABS: 1.0000 | ORAL_TABLET | Freq: Four times a day (QID) | ORAL | 0 refills | Status: DC | PRN

## 2021-09-04 MED ORDER — POTASSIUM CHLORIDE CRYS ER 20 MEQ PO TBCR
40.0000 meq | EXTENDED_RELEASE_TABLET | Freq: Once | ORAL | Status: AC
  Administered 2021-09-04: 40 meq via ORAL
  Filled 2021-09-04: qty 2

## 2021-09-04 MED ORDER — METHOCARBAMOL 500 MG PO TABS: 500.0000 mg | ORAL_TABLET | Freq: Three times a day (TID) | ORAL | 0 refills | Status: DC

## 2021-09-04 MED ORDER — CEPHALEXIN 500 MG PO CAPS: 500.0000 mg | ORAL_CAPSULE | Freq: Two times a day (BID) | ORAL | 0 refills | Status: AC

## 2021-09-04 MED ORDER — ONDANSETRON HCL 4 MG PO TABS: 4.0000 mg | ORAL_TABLET | Freq: Four times a day (QID) | ORAL | 0 refills | Status: DC

## 2021-09-04 MED ORDER — FENTANYL CITRATE PF 50 MCG/ML IJ SOSY: 50.0000 ug | PREFILLED_SYRINGE | Freq: Once | INTRAMUSCULAR | Status: DC

## 2021-09-04 MED ORDER — ONDANSETRON 4 MG PO TBDP
4.0000 mg | ORAL_TABLET | Freq: Once | ORAL | Status: AC
  Administered 2021-09-04: 4 mg via ORAL

## 2021-09-04 NOTE — ED Triage Notes (Signed)
Lower back pain since yesterday.  Pain on urination.

## 2021-09-04 NOTE — Discharge Instructions (Addendum)
Your CT scan today did not show evidence of a kidney stone.  This may be pain related to your back.  I recommend no heavy lifting or twisting for 1 week.  Take the medication as directed.  I have listed some providers for you to arrange primary care.   [facility] Primary Care Doctor List    Kari Baars MD. Specialty: Pulmonary Disease Contact information: [address]  [address]  [place] [address]  [phone]   Syliva Overman, MD. Specialty: [facility] Medicine Contact information: [address]  [place] [address]  [phone]   Lilyan Punt, MD. Specialty: [facility] Medicine Contact information: [address]  [place] [address]  [phone]   Avon Gully, MD Specialty: Internal Medicine Contact information: [address] [place] [address]  [phone]   Catalina Pizza, MD. Specialty: Internal Medicine Contact information: [address]  [place] [address]  [phone]    [facility] (Dr. Selena Batten) Specialty: Family Medicine Contact information: [address]  [place] [address]  [phone]   John Giovanni, MD. Specialty: [facility] Medicine Contact information: [address]  [address]  [place] [address]  [phone]   Carylon Perches, MD. Specialty: Internal Medicine Contact information: [address]  [address]  [place] [address]  [phone]    [facility]  [address] [place], [place] [address] [phone]  Services [facility] offers a variety of basic health services.  Services include but are not limited to: Blood pressure checks  Heart rate checks  Blood sugar checks  Urine analysis  Rapid strep tests  Pregnancy tests.  Health education and referrals  People needing more complex services will be directed to a physician online. Using these virtual visits, doctors can evaluate and prescribe  medicine and treatments. There will be no medication on-site, though [facility] will help patients fill their prescriptions at little to no cost.   For More information please go to: [email or web address]

## 2021-09-04 NOTE — ED Notes (Signed)
Pt ambulated to restroom. 

## 2021-09-04 NOTE — ED Provider Notes (Signed)
[facility] EMERGENCY DEPARTMENT Provider Note   CSN: [id number] Arrival date & time: 09/04/21  1148     History Chief Complaint  Patient presents with   Flank Pain    [name] is a 45 y.o. female.   Flank Pain Pertinent negatives include no chest pain, no abdominal pain and no shortness of breath.       [name] is a 45 y.o. female who presents to the Emergency Department complaining of decreased urination and pain with urination x1 week.  She states that she is voiding very small amounts of urine that is foul-smelling and appears dark in color.  Began having pain to her left flank yesterday that is worse today.  States the pain radiates to her left lower back as well.  No pain radiating into her leg.  Pain is also worse with walking or standing.  No alleviating factors.  She denies any fever, chills, abdominal pain, vomiting, or known dysuria.  She was evaluated at a local urgent care earlier today and advised to come to the ER for further evaluation.  She does endorse history of a prior kidney stone several years ago.   Past Medical History:  Diagnosis Date   Asthma    Stroke [facility])     Patient Active Problem List   Diagnosis Date Noted   Macrocytic anemia 04/12/2014   Abdominal pain 04/07/2014   Nausea and vomiting in adult 04/07/2014   Acute diarrhea 04/07/2014   Acute pancreatitis 04/07/2014   Asthma 04/07/2014    Past Surgical History:  Procedure Laterality Date   BLADDER SURGERY     CHOLECYSTECTOMY     ESOPHAGOGASTRODUODENOSCOPY N/A 04/10/2014   Procedure: ESOPHAGOGASTRODUODENOSCOPY (EGD);  Surgeon: Malissa Hippo, MD;  [facility];  Service: Endoscopy;  Laterality: N/A;     OB History     Gravida  3   Para  2   Term  2   Preterm      AB  1   Living         SAB  1   IAB      Ectopic      Multiple      Live Births              Family History  Problem Relation Age of Onset   Pancreatic cancer Mother     Stroke Mother    Diabetes Father    Hypertension Father     Social History   Tobacco Use   Smoking status: Every Day    Packs/day: 1.00    Years: 13.00    Pack years: 13.00    Types: Cigarettes   Smokeless tobacco: Never  Substance Use Topics   Alcohol use: No   Drug use: No    Home Medications Prior to Admission medications   Medication Sig Start Date End Date Taking? Authorizing Provider  albuterol (PROVENTIL HFA;VENTOLIN HFA) 108 (90 BASE) MCG/ACT inhaler Inhale 2 puffs into the lungs every 6 (six) hours as needed for wheezing or shortness of breath.    [provider]  cephALEXin (KEFLEX) 500 MG capsule Take 1 capsule (500 mg total) by mouth 2 (two) times daily for 10 days. 09/04/21 09/14/21  Wurst, Lowanda Foster, PA-C  ibuprofen (ADVIL,MOTRIN) 200 MG tablet Take 600 mg by mouth every 6 (six) hours as needed for moderate pain.    [provider]  ondansetron (ZOFRAN) 4 MG tablet Take 1 tablet (4 mg total) by mouth  every 6 (six) hours. 09/04/21   Wurst, [place], PA-C  promethazine (PHENERGAN) 25 MG tablet Take 1 tablet (25 mg total) by mouth every 6 (six) hours as needed for nausea or vomiting. Patient not taking: Reported on 11/18/2015 06/10/15   Janne Napoleon, NP    Allergies    Patient has no known allergies.  Review of Systems   Review of Systems  Constitutional:  Negative for chills and fever.  Respiratory:  Negative for shortness of breath and wheezing.   Cardiovascular:  Negative for chest pain.  Gastrointestinal:  Negative for abdominal pain, nausea and vomiting.  Genitourinary:  Positive for difficulty urinating and flank pain. Negative for hematuria, menstrual problem, vaginal bleeding, vaginal discharge and vaginal pain.  Musculoskeletal:  Positive for back pain.  Neurological:  Negative for dizziness, weakness, light-headedness and numbness.   Physical Exam Updated Vital Signs BP 128/81   Pulse 72   Temp 98.1 F (36.7 C) (Oral)   Resp 16   Ht  4\' 9"  (1.448 m)   Wt 49.9 kg   LMP 08/10/2021 (Approximate)   SpO2 99%   BMI 23.80 kg/m   Physical Exam Vitals and nursing note reviewed.  Constitutional:      Appearance: Normal appearance. She is not ill-appearing or toxic-appearing.     Comments: Patient is uncomfortable appearing  HENT:     Head: Normocephalic.  Eyes:     Conjunctiva/sclera: Conjunctivae normal.  Neck:     Thyroid: No thyromegaly.     Meningeal: Kernig's sign absent.  Cardiovascular:     Rate and Rhythm: Normal rate and regular rhythm.     Pulses: Normal pulses.  Pulmonary:     Effort: Pulmonary effort is normal.     Breath sounds: Normal breath sounds. No wheezing.  Abdominal:     Palpations: Abdomen is soft.     Tenderness: There is no abdominal tenderness. There is no right CVA tenderness, left CVA tenderness, guarding or rebound.  Musculoskeletal:        General: Tenderness present. Normal range of motion.     Cervical back: Normal range of motion. No tenderness.     Right lower leg: No edema.     Left lower leg: No edema.     Comments: Tenderness palpation of the lower lumbar spine and left lumbar paraspinal muscles.  Mild tenderness also present at the left SI joint.  Skin:    General: Skin is warm.     Capillary Refill: Capillary refill takes less than 2 seconds.     Findings: No rash.  Neurological:     General: No focal deficit present.     Mental Status: She is alert.     Sensory: No sensory deficit.     Motor: No weakness.    ED Results / Procedures / Treatments   Labs (all labs ordered are listed, but only abnormal results are displayed) Labs Reviewed  URINALYSIS, ROUTINE W REFLEX MICROSCOPIC - Abnormal; Notable for the following components:      Result Value   APPearance HAZY (*)    Hgb urine dipstick MODERATE (*)    Ketones, ur 5 (*)    All other components within normal limits  CBC WITH DIFFERENTIAL/PLATELET - Abnormal; Notable for the following components:   RBC 5.58 (*)     Hemoglobin 11.2 (*)    MCV 64.7 (*)    MCH 20.1 (*)    All other components within normal limits  BASIC METABOLIC PANEL - Abnormal; Notable  for the following components:   Potassium 3.2 (*)    All other components within normal limits  URINE CULTURE  POC URINE PREG, ED    EKG None  Radiology CT Renal Stone Study  Result Date: 09/04/2021 CLINICAL DATA:  Left flank pain concern for renal stone EXAM: CT ABDOMEN AND PELVIS WITHOUT CONTRAST TECHNIQUE: Multidetector CT imaging of the abdomen and pelvis was performed following the standard protocol without IV contrast. COMPARISON:  CT August 26, 2014 FINDINGS: Lower chest: No acute abnormality. Hepatobiliary: Unremarkable noncontrast appearance of the hepatic parenchyma. Gallbladder surgically absent. Similar dilation of the extrahepatic biliary tree and prominence of the intrahepatic biliary tree with the common bile duct measuring 14 mm, unchanged and favored reservoir effect post cholecystectomy. Pancreas: Unremarkable noncontrast appearance of the pancreatic parenchyma. No pancreatic ductal dilation. Spleen: Within normal limits. Adrenals/Urinary Tract: Bilateral adrenal glands are unremarkable. No hydronephrosis. No renal, ureteral or bladder calculi visualized. Urinary bladder is decompressed. Stomach/Bowel: Stomach is unremarkable for degree of distension. No pathologic dilation of small or large bowel. The appendix and terminal ileum appear normal. Mild diffuse colonic diverticulosis without findings of acute diverticulitis. Vascular/Lymphatic: No significant vascular findings are present. No enlarged abdominal or pelvic lymph nodes. Reproductive: Uterus and bilateral adnexa are unremarkable. Other: Physiologic volume of pelvic free fluid. Musculoskeletal: Very mild lower lumbar spondylosis. No acute osseous abnormality. IMPRESSION: 1. No acute abdominopelvic findings. Specifically, no evidence of obstructive uropathy. 2. Mild diffuse colonic  diverticulosis without findings of acute diverticulitis. 3. Similar dilation of the extrahepatic biliary tree and prominence of the intrahepatic biliary tree with the common bile duct measuring 14 mm, unchanged and favored reservoir effect post cholecystectomy. Electronically Signed   By: Maudry Mayhew M.D.   On: 09/04/2021 15:47    Procedures Procedures   Medications Ordered in ED Medications  fentaNYL (SUBLIMAZE) injection 50 mcg (has no administration in time range)    ED Course  I have reviewed the triage vital signs and the nursing notes.  Pertinent labs & imaging results that were available during my care of the patient were reviewed by me and considered in my medical decision making (see chart for details).    MDM Rules/Calculators/A&P                           Patient here for evaluation of left flank pain and decreased urination.  Notes having pain with urination for 1 week.  Flank pain onset yesterday.  Has history of kidney stone.  No known injury, fever, vomiting or abdominal pain.  Patient is uncomfortable but nontoxic-appearing.  On exam, she has tenderness of the lower lumbar spine and left paraspinal muscles.  There is some tenderness also noted at the SI joint.  No CVA tenderness.  Clinically, suspect this is musculoskeletal, but since patient does report symptoms of dysuria we will proceed with CT renal stone study and urinalysis.  On recheck, patient continues to complain of some mild pain but improving after oral pain medication given here.  Labs interpreted by me, show no leukocytosis, mild hypokalemia similar to baseline.  She was given oral potassium.  Urinalysis shows moderate hemoglobin without evidence of infection.  Pregnancy test negative.  CT renal stone study negative for obstructive uropathy or acute abdominopelvic findings.  Discussed all findings with the patient, all questions were answered.  She reports feeling somewhat better and ready for discharge  home.  We will treat with short course of pain medication  and muscle relaxer.  Patient is requesting resources to establish primary care.  Turn precautions were also discussed.  Final Clinical Impression(s) / ED Diagnoses Final diagnoses:  Left flank pain    Rx / DC Orders ED Discharge Orders     None        Pauline Aus, PA-C 09/04/21 1754    Vanetta Mulders, MD 09/14/21 1303

## 2021-09-04 NOTE — ED Provider Notes (Signed)
[facility]   CC: Burning with urination  SUBJECTIVE:  [name] is a 45 y.o. female who complains of painful urination and bilateral flank pain x 1-2 weeks.  Patient denies a precipitating event, recent sexual encounter, excessive caffeine intake.  Denies alleviating factors.  Symptoms are made worse with urination.  Admits to similar symptoms in the past.  Denies fever, chills, nausea, vomiting, abdominal pain, hematuria.    LMP: Patient's last menstrual period was 08/10/2021 (approximate).  ROS: As in HPI.  All other pertinent ROS negative.     Past Medical History:  Diagnosis Date   Asthma    Stroke [facility])    Past Surgical History:  Procedure Laterality Date   BLADDER SURGERY     CHOLECYSTECTOMY     ESOPHAGOGASTRODUODENOSCOPY N/A 04/10/2014   Procedure: ESOPHAGOGASTRODUODENOSCOPY (EGD);  Surgeon: Malissa Hippo, MD;  Location: [facility];  Service: Endoscopy;  Laterality: N/A;   No Known Allergies No current facility-administered medications on file prior to encounter.   Current Outpatient Medications on File Prior to Encounter  Medication Sig Dispense Refill   albuterol (PROVENTIL HFA;VENTOLIN HFA) 108 (90 BASE) MCG/ACT inhaler Inhale 2 puffs into the lungs every 6 (six) hours as needed for wheezing or shortness of breath.     ibuprofen (ADVIL,MOTRIN) 200 MG tablet Take 600 mg by mouth every 6 (six) hours as needed for moderate pain.     promethazine (PHENERGAN) 25 MG tablet Take 1 tablet (25 mg total) by mouth every 6 (six) hours as needed for nausea or vomiting. (Patient not taking: Reported on 11/18/2015) 30 tablet 0   Social History   Socioeconomic History   Marital status: Widowed    Spouse name: Not on file   Number of children: Not on file   Years of education: Not on file   Highest education level: Not on file  Occupational History   Not on file  Tobacco Use   Smoking status: Every Day    Packs/day: 1.00    Years: 13.00    Pack years:  13.00    Types: Cigarettes   Smokeless tobacco: Never  Substance and Sexual Activity   Alcohol use: No   Drug use: No   Sexual activity: Never    Birth control/protection: None  Other Topics Concern   Not on file  Social History Narrative   Not on file   Social Determinants of Health   Financial Resource Strain: Not on file  Food Insecurity: Not on file  Transportation Needs: Not on file  Physical Activity: Not on file  Stress: Not on file  Social Connections: Not on file  Intimate Partner Violence: Not on file   Family History  Problem Relation Age of Onset   Pancreatic cancer Mother    Stroke Mother    Diabetes Father    Hypertension Father     OBJECTIVE:  Vitals:   09/04/21 1044  BP: 125/78  Pulse: 95  Resp: 18  Temp: 98.1 F (36.7 C)  TempSrc: Oral  SpO2: 98%   General appearance: AOx3 in no acute distress HEENT: NCAT.  Oropharynx clear.  Lungs: clear to auscultation bilaterally without adventitious breath sounds Heart: regular rate and rhythm.   Abdomen: soft; non-distended; no tenderness; bowel sounds present; no guarding Back: + bilateral CVA tenderness Extremities: no edema; symmetrical with no gross deformities Skin: warm and dry Neurologic: Ambulates from chair to exam table without difficulty Psychological: alert and cooperative; normal mood and affect  Labs Reviewed  POCT URINALYSIS DIP (MANUAL ENTRY) - Abnormal; Notable for the following components:      Result Value   Bilirubin, UA small (*)    Ketones, POC UA small (15) (*)    Spec Grav, UA >=1.030 (*)    Blood, UA moderate (*)    Protein Ur, POC =30 (*)    Nitrite, UA Positive (*)    Leukocytes, UA Trace (*)    All other components within normal limits    ASSESSMENT & PLAN:  1. Dysuria     Meds ordered this encounter  Medications   cephALEXin (KEFLEX) 500 MG capsule    Sig: Take 1 capsule (500 mg total) by mouth 2 (two) times daily for 10 days.    Dispense:  20 capsule     Refill:  0    Order Specific Question:   Supervising Provider    Answer:   Eustace Moore [[id number]]   ondansetron (ZOFRAN) 4 MG tablet    Sig: Take 1 tablet (4 mg total) by mouth every 6 (six) hours.    Dispense:  12 tablet    Refill:  0    Order Specific Question:   Supervising Provider    Answer:   Eustace Moore [[id number]]   ondansetron (ZOFRAN-ODT) disintegrating tablet 4 mg   Urine concerning for infection Not enough urine for culture.   Push fluids and get plenty of rest.   Take antibiotic as directed and to completion Follow up with PCP if symptoms persists Return here or go to ER if you have any new or worsening symptoms such as fever, worsening abdominal pain, nausea/vomiting, flank pain, etc...  Outlined signs and symptoms indicating need for more acute intervention. Patient verbalized understanding. After Visit Summary given.      Rennis Harding, PA-C 09/04/21 1141

## 2021-09-04 NOTE — ED Triage Notes (Signed)
Pt presents to ED with complaints of left flank pain started last night. Pain with urination. Seen at Urgent Care this morning.

## 2021-09-04 NOTE — Discharge Instructions (Signed)
Urine concerning for infection Not enough urine for culture.   Push fluids and get plenty of rest.   Take antibiotic as directed and to completion Follow up with PCP if symptoms persists Return here or go to ER if you have any new or worsening symptoms such as fever, worsening abdominal pain, nausea/vomiting, flank pain, etc..Marland Kitchen

## 2021-09-06 LAB — URINE CULTURE: Culture: >=100000 — AB

## 2021-09-07 ENCOUNTER — Telehealth: Payer: Self-pay | Admitting: *Deleted

## 2021-09-07 NOTE — Telephone Encounter (Signed)
Post ED Visit - Positive Culture Follow-up  Culture report reviewed by antimicrobial stewardship pharmacist: [facility] []  , Pharm.D. []  Enzo Bi, Pharm.D., BCPS AQ-ID []  , Pharm.D., BCPS []  Celedonio Miyamoto, .D., BCPS []  [place], .D., BCPS, AAHIVP []  Georgina Pillion, Pharm.D., BCPS, AAHIVP []  [address], PharmD, BCPS []  , PharmD, BCPS []  [place], PharmD, BCPS []  [place], PharmD []  , PharmD, BCPS []  Estella Husk, PharmD  Pharmacy Team []  Lysle Pearl, PharmD []  , PharmD []  Phillips Climes, PharmD []  , Rph []  Agapito Games) , PharmD []  Verlan Friends, PharmD []  , PharmD []  Mervyn Gay, PharmD []  , PharmD []  Vinnie Level, PharmD []  [facility], PharmD []  , PharmD []  Len Childs, PharmD   Positive urine culture No antibiotics recommended.and no further patient follow-up is required at this time.  , PharmD  Greer Pickerel Talley 09/07/2021, 9:11 AM

## 2021-09-10 ENCOUNTER — Telehealth: Payer: Self-pay

## 2021-09-10 NOTE — Telephone Encounter (Signed)
Pt f/u with [facility]/[name] on today to get their first medical appointment scheduled with the [facility] per completing  [facility] enrollment and eligibility (09/08/2021)     Chief Medical Complaint(s): Lower left back pain (hx 2-3 yrs or more)       (Recently seen in [facility] ER ) Intermittent nausea and vomiting (currently and hx over a year) Re-establish medications (GERD, Asthma, Anxiety/Depression)   First appt has been scheduled for [name]) Sep 15, 2021 @ 9:15 am  Information was reveiwed regarding "arrival time, bringing meds if applicable and cost ($10) one time donation fee for first visit, and cancellation/rescheduling appt policy.  Pt stated they understood and phone called

## 2021-09-15 ENCOUNTER — Ambulatory Visit: Payer: Self-pay | Admitting: Physician Assistant

## 2021-09-29 ENCOUNTER — Ambulatory Visit: Payer: Self-pay | Admitting: Physician Assistant

## 2021-09-29 ENCOUNTER — Encounter: Payer: Self-pay | Admitting: Physician Assistant

## 2021-09-29 ENCOUNTER — Other Ambulatory Visit: Payer: Self-pay

## 2021-09-29 VITALS — BP 118/78 | HR 92 | Temp 97.7°F | Ht 58.5 in | Wt 108.5 lb

## 2021-09-29 DIAGNOSIS — Z131 Encounter for screening for diabetes mellitus: Secondary | ICD-10-CM

## 2021-09-29 DIAGNOSIS — D649 Anemia, unspecified: Secondary | ICD-10-CM

## 2021-09-29 DIAGNOSIS — R10816 Epigastric abdominal tenderness: Secondary | ICD-10-CM

## 2021-09-29 DIAGNOSIS — J449 Chronic obstructive pulmonary disease, unspecified: Secondary | ICD-10-CM

## 2021-09-29 DIAGNOSIS — Z13220 Encounter for screening for lipoid disorders: Secondary | ICD-10-CM

## 2021-09-29 DIAGNOSIS — Z1211 Encounter for screening for malignant neoplasm of colon: Secondary | ICD-10-CM

## 2021-09-29 DIAGNOSIS — F418 Other specified anxiety disorders: Secondary | ICD-10-CM

## 2021-09-29 DIAGNOSIS — K219 Gastro-esophageal reflux disease without esophagitis: Secondary | ICD-10-CM

## 2021-09-29 DIAGNOSIS — E876 Hypokalemia: Secondary | ICD-10-CM

## 2021-09-29 DIAGNOSIS — F17200 Nicotine dependence, unspecified, uncomplicated: Secondary | ICD-10-CM

## 2021-09-29 DIAGNOSIS — Z7689 Persons encountering health services in other specified circumstances: Secondary | ICD-10-CM

## 2021-09-29 MED ORDER — DULOXETINE HCL 30 MG PO CPEP: 30.0000 mg | ORAL_CAPSULE | Freq: Every day | ORAL | 0 refills | Status: DC

## 2021-09-29 MED ORDER — ALBUTEROL SULFATE HFA 108 (90 BASE) MCG/ACT IN AERS: 2.0000 | INHALATION_SPRAY | Freq: Four times a day (QID) | RESPIRATORY_TRACT | 0 refills | Status: AC | PRN

## 2021-09-29 MED ORDER — OMEPRAZOLE 40 MG PO CPDR: 40.0000 mg | DELAYED_RELEASE_CAPSULE | Freq: Every day | ORAL | 3 refills | Status: DC

## 2021-09-29 MED ORDER — ASMANEX HFA 200 MCG/ACT IN AERO: 1.0000 | INHALATION_SPRAY | Freq: Two times a day (BID) | RESPIRATORY_TRACT | 0 refills | Status: DC

## 2021-09-29 NOTE — Progress Notes (Signed)
BP 118/78   Pulse 92   Temp 97.7 F (36.5 C)   Ht 4' 10.5" (1.486 m)   Wt 108 lb 8.3 oz (49.2 kg)   SpO2 98%   BMI 22.29 kg/m    Subjective:    Patient ID: [name], female    DOB: July 17, 1976, 45 y.o.   MRN: [id number]  HPI: [name] is a 45 y.o. female presenting on 09/29/2021 for New Patient (Initial Visit)   HPI  Pt had a negative covid 19 screening questionnaire.  Chief Complaint  Patient presents with   New Patient (Initial Visit)     Pt says she Last had a  PCP in 2019 when she was going to [facility].  She stopped going there when she moved to [place].  She says she just recently moved back to [place].  She uses her albuterol inhaler 3 or 4 times every day.  She says she has never been on a maintenance inhaler.    She takes zofran about 3 / week for nausea.    She got something wrong with her stomach.  She had EGD in past but says they didn't tell her anything with that.   She saw dr Karilyn Cota and had the EGD while an inpatient.  She Was hospitalized for this in 2015.   Pt never followed up with GI after getting out of the hospital.   She says she had a stroke in 2008 ([place]).  Records reviewed and diagnosis was suspected cva.  Pt says left hand and leg weak now from it.   LMP sept 10  She says she is Not sexually active for the last 6-8 months.  She says she was using condoms when last she was active.  She does not work.  She watches her grandkids ages 42, 4 and 32.  She has not got covid vaccination.  She had depression in past and has it now.  No SI, HI.   She went to [facility] in past.  She says she was treated with cymbalta and it "worked good".  She says she hasn't been in maybe 5 years.  She Doesn't know why she stopped going except she doesn't like doctors.   She has never had a mammogram or colonoscopy.  She thinks her last PAP was 2019.    Hx kidney stones     Relevant past medical, surgical, family and social history reviewed and  updated as indicated. Interim medical history since our last visit reviewed. Allergies and medications reviewed and updated.    Current Outpatient Medications:    albuterol (PROVENTIL HFA;VENTOLIN HFA) 108 (90 BASE) MCG/ACT inhaler, Inhale 2 puffs into the lungs every 6 (six) hours as needed for wheezing or shortness of breath., Disp: , Rfl:    ondansetron (ZOFRAN) 4 MG tablet, Take 1 tablet (4 mg total) by mouth every 6 (six) hours., Disp: 12 tablet, Rfl: 0   Review of Systems  Per HPI unless specifically indicated above     Objective:    BP 118/78   Pulse 92   Temp 97.7 F (36.5 C)   Ht 4' 10.5" (1.486 m)   Wt 108 lb 8.3 oz (49.2 kg)   SpO2 98%   BMI 22.29 kg/m   Wt Readings from Last 3 Encounters:  09/29/21 108 lb 8.3 oz (49.2 kg)  09/04/21 110 lb (49.9 kg)  11/18/15 118 lb (53.5 kg)    Physical Exam Vitals reviewed.  Constitutional:  General: She is not in acute distress.    Appearance: She is well-developed. She is not ill-appearing.  HENT:     Head: Normocephalic and atraumatic.     Right Ear: Tympanic membrane, ear canal and external ear normal.     Left Ear: Tympanic membrane, ear canal and external ear normal.  Eyes:     Extraocular Movements: Extraocular movements intact.     Conjunctiva/sclera: Conjunctivae normal.     Pupils: Pupils are equal, round, and reactive to light.  Neck:     Thyroid: No thyromegaly.  Cardiovascular:     Rate and Rhythm: Normal rate and regular rhythm.  Pulmonary:     Effort: Pulmonary effort is normal.     Breath sounds: Normal breath sounds.  Abdominal:     General: Bowel sounds are normal.     Palpations: Abdomen is soft. There is no hepatomegaly, splenomegaly, mass or pulsatile mass.     Tenderness: There is abdominal tenderness in the epigastric area. There is no guarding or rebound.  Musculoskeletal:     Cervical back: Neck supple.     Right lower leg: No edema.     Left lower leg: No edema.  Lymphadenopathy:      Cervical: No cervical adenopathy.  Skin:    General: Skin is warm and dry.  Neurological:     Mental Status: She is alert and oriented to person, place, and time.     Motor: No weakness or tremor.     Gait: Gait is intact. Gait normal.     Deep Tendon Reflexes:     Reflex Scores:      Patellar reflexes are 2+ on the right side and 2+ on the left side.    Comments: No left sided weakness of upper or lower extremity appreciated  Psychiatric:        Attention and Perception: Attention normal.        Speech: Speech normal.        Behavior: Behavior is cooperative.     Comments: Affect is guarded      GAD-7 score 13 PHA-9 score 4      Assessment & Plan:    Encounter Diagnoses  Name Primary?   Encounter to establish care Yes   Gastroesophageal reflux disease, unspecified whether esophagitis present    Epigastric abdominal tenderness without rebound tenderness    Chronic obstructive pulmonary disease, unspecified COPD type (HCC)    Tobacco use disorder    Anxiety with depression    Hypokalemia    Anemia, unspecified type    Screening cholesterol level    Screening for colon cancer    Screening for diabetes mellitus       -pt is encouraged to Stop smoking.  Will start Maintenance inhaler and she is to Continue albuterol prn.  She was given sample asmanex and [employer] application (to get advair) that she is to return to office.  -pt was educated on Covid vaccination and encouraged to get it  -will refer for screening Mammogram  -will get Baseline labs  -pt was given FIT test for colon cancer screening  -pt will be restarted on Cymbata for anxiety and depression  -will start Prilosec for GI issues.    -pt to follow up 1 month.  She is to contact office sooner prn

## 2021-09-30 ENCOUNTER — Other Ambulatory Visit (HOSPITAL_COMMUNITY)
Admission: RE | Admit: 2021-09-30 | Discharge: 2021-09-30 | Disposition: A | Discharge status: Home / Self Care | Payer: Self-pay | Source: Ambulatory Visit | Attending: Physician Assistant | Admitting: Physician Assistant

## 2021-09-30 ENCOUNTER — Other Ambulatory Visit: Payer: Self-pay | Admitting: Physician Assistant

## 2021-09-30 ENCOUNTER — Encounter: Payer: Self-pay | Admitting: Physician Assistant

## 2021-09-30 DIAGNOSIS — Z13220 Encounter for screening for lipoid disorders: Secondary | ICD-10-CM | POA: Insufficient documentation

## 2021-09-30 DIAGNOSIS — Z1211 Encounter for screening for malignant neoplasm of colon: Secondary | ICD-10-CM

## 2021-09-30 DIAGNOSIS — D649 Anemia, unspecified: Secondary | ICD-10-CM | POA: Insufficient documentation

## 2021-09-30 DIAGNOSIS — Z131 Encounter for screening for diabetes mellitus: Secondary | ICD-10-CM | POA: Insufficient documentation

## 2021-09-30 DIAGNOSIS — E876 Hypokalemia: Secondary | ICD-10-CM | POA: Insufficient documentation

## 2021-09-30 LAB — CBC
HCT: 39.2 % (ref 36.0–46.0)
Hemoglobin: 11.8 g/dL — ABNORMAL LOW (ref 12.0–15.0)
MCH: 20.1 pg — ABNORMAL LOW (ref 26.0–34.0)
MCHC: 30.1 g/dL (ref 30.0–36.0)
MCV: 66.7 fL — ABNORMAL LOW (ref 80.0–100.0)
Platelets: 403 10*3/uL — ABNORMAL HIGH (ref 150–400)
RBC: 5.88 MIL/uL — ABNORMAL HIGH (ref 3.87–5.11)
RDW: 14.6 % (ref 11.5–15.5)
WBC: 7.4 10*3/uL (ref 4.0–10.5)
nRBC: 0 % (ref 0.0–0.2)

## 2021-09-30 LAB — COMPREHENSIVE METABOLIC PANEL
ALT: 13 U/L (ref 0–44)
AST: 17 U/L (ref 15–41)
Albumin: 4.6 g/dL (ref 3.5–5.0)
Alkaline Phosphatase: 60 U/L (ref 38–126)
Anion gap: 8 (ref 5–15)
BUN: 13 mg/dL (ref 6–20)
CO2: 30 mmol/L (ref 22–32)
Calcium: 9.1 mg/dL (ref 8.9–10.3)
Chloride: 98 mmol/L (ref 98–111)
Creatinine, Ser: 0.97 mg/dL (ref 0.44–1.00)
GFR, Estimated: >60 mL/min (ref >60)
Glucose, Bld: 82 mg/dL (ref 70–99)
Potassium: 3.9 mmol/L (ref 3.5–5.1)
Sodium: 136 mmol/L (ref 135–145)
Total Bilirubin: 0.7 mg/dL (ref 0.3–1.2)
Total Protein: 7.7 g/dL (ref 6.5–8.1)

## 2021-09-30 LAB — HEMOGLOBIN A1C
Hgb A1c MFr Bld: 5.8 % — ABNORMAL HIGH (ref 4.8–5.6)
Mean Plasma Glucose: 119.76 mg/dL

## 2021-09-30 LAB — LIPID PANEL
Cholesterol: 186 mg/dL (ref 0–200)
HDL: 55 mg/dL (ref >40)
LDL Cholesterol: 114 mg/dL — ABNORMAL HIGH (ref 0–99)
Total CHOL/HDL Ratio: 3.4 RATIO
Triglycerides: 87 mg/dL (ref <150)
VLDL: 17 mg/dL (ref 0–40)

## 2021-10-05 ENCOUNTER — Other Ambulatory Visit: Payer: Self-pay

## 2021-10-05 DIAGNOSIS — Z1231 Encounter for screening mammogram for malignant neoplasm of breast: Secondary | ICD-10-CM

## 2021-10-06 ENCOUNTER — Telehealth: Payer: Self-pay

## 2021-10-06 NOTE — Telephone Encounter (Signed)
Spoke with pt & informed of mammogram appt 10/22/21 at 11am.

## 2021-10-07 ENCOUNTER — Telehealth: Payer: Self-pay | Admitting: Physician Assistant

## 2021-10-07 NOTE — Telephone Encounter (Signed)
Record request sent to [facility] requesting most recent PAP as discussed at new pt appointment but [facility] states they have no PAP on file for her

## 2021-10-13 ENCOUNTER — Telehealth: Payer: Self-pay | Admitting: Licensed Clinical Social Worker

## 2021-10-13 NOTE — Telephone Encounter (Signed)
[facility] reached patient via phone call, offered scheduling for counseling services, patient declined stating that she "don't think I need it." [facility] encouraged patient to reach out in the future if she ever changes her mind.

## 2021-10-22 ENCOUNTER — Ambulatory Visit (HOSPITAL_COMMUNITY): Payer: Self-pay

## 2021-10-27 ENCOUNTER — Encounter: Payer: Self-pay | Admitting: Physician Assistant

## 2021-10-27 ENCOUNTER — Ambulatory Visit: Payer: Self-pay | Admitting: Physician Assistant

## 2021-10-27 DIAGNOSIS — K219 Gastro-esophageal reflux disease without esophagitis: Secondary | ICD-10-CM

## 2021-10-27 DIAGNOSIS — J449 Chronic obstructive pulmonary disease, unspecified: Secondary | ICD-10-CM

## 2021-10-27 DIAGNOSIS — F17200 Nicotine dependence, unspecified, uncomplicated: Secondary | ICD-10-CM

## 2021-10-27 DIAGNOSIS — F418 Other specified anxiety disorders: Secondary | ICD-10-CM

## 2021-10-27 NOTE — Progress Notes (Signed)
There were no vitals taken for this visit.   Subjective:    Patient ID: [name], female    DOB: Oct 22, 1976, 45 y.o.   MRN: [id number]  HPI: [name] is a 45 y.o. female presenting on 10/27/2021 for No chief complaint on file.   HPI   This is a telemedicine appointment through [employer].  I connected with  [name] on 10/27/21  by a video enabled telemedicine application and verified that I am speaking with the correct person using two identifiers.   I discussed the limitations of evaluation and management by telemedicine. The patient expressed understanding and agreed to proceed.  Pt is at home.  Provider is at office.       Pt has appointment to review labs, discuss her mental health issues and check her copd.     She says Her daughter and grandkids had flu.  She has been sick and is improving but still hard on her breathing.  She continues to smoke.  She was a no-show to her mammogram appointment  She didn't return her [employer] application to get advair  She says The cymbalta is doing great and she isn't feeling any more anxiety or depression.    She says the Omeprazole is doing good for her gerd      Relevant past medical, surgical, family and social history reviewed and updated as indicated. Interim medical history since our last visit reviewed. Allergies and medications reviewed and updated.   Current Outpatient Medications:    albuterol (VENTOLIN HFA) 108 (90 Base) MCG/ACT inhaler, Inhale 2 puffs into the lungs every 6 (six) hours as needed for wheezing or shortness of breath., Disp: 3 each, Rfl: 0   DULoxetine (CYMBALTA) 30 MG capsule, Take 1 capsule (30 mg total) by mouth daily., Disp: 90 capsule, Rfl: 0   Mometasone Furoate (ASMANEX HFA) 200 MCG/ACT AERO, Inhale 1 puff into the lungs 2 (two) times daily., Disp: 1 each, Rfl: 0   omeprazole (PRILOSEC) 40 MG capsule, Take 1 capsule (40 mg total) by mouth daily., Disp: 30 capsule, Rfl: 3    ondansetron (ZOFRAN) 4 MG tablet, Take 1 tablet (4 mg total) by mouth every 6 (six) hours., Disp: 12 tablet, Rfl: 0    Review of Systems  Per HPI unless specifically indicated above     Objective:    There were no vitals taken for this visit.  Wt Readings from Last 3 Encounters:  09/29/21 108 lb 8.3 oz (49.2 kg)  09/04/21 110 lb (49.9 kg)  11/18/15 118 lb (53.5 kg)    Physical Exam Constitutional:      General: She is not in acute distress.    Appearance: She is not toxic-appearing.  HENT:     Head: Normocephalic and atraumatic.  Pulmonary:     Effort: No respiratory distress.     Comments: Pt is talking in complete sentences without dyspnea Neurological:     Mental Status: She is alert and oriented to person, place, and time.  Psychiatric:        Attention and Perception: Attention normal.        Speech: Speech normal.        Behavior: Behavior is cooperative.    Results for orders placed or performed during the hospital encounter of 09/30/21  Hemoglobin A1c  Result Value Ref Range   Hgb A1c MFr Bld 5.8 (H) 4.8 - 5.6 %   Mean Plasma Glucose 119.76 mg/dL  CBC  Result Value  Ref Range   WBC 7.4 4.0 - 10.5 K/uL   RBC 5.88 (H) 3.87 - 5.11 MIL/uL   Hemoglobin 11.8 (L) 12.0 - 15.0 g/dL   HCT 85.4 62.7 - 03.5 %   MCV 66.7 (L) 80.0 - 100.0 fL   MCH 20.1 (L) 26.0 - 34.0 pg   MCHC 30.1 30.0 - 36.0 g/dL   RDW 00.9 38.1 - 82.9 %   Platelets 403 (H) 150 - 400 K/uL   nRBC 0.0 0.0 - 0.2 %  Lipid panel  Result Value Ref Range   Cholesterol 186 0 - 200 mg/dL   Triglycerides 87 <937 mg/dL   HDL 55 >16 mg/dL   Total CHOL/HDL Ratio 3.4 RATIO   VLDL 17 0 - 40 mg/dL   LDL Cholesterol 967 (H) 0 - 99 mg/dL  Comprehensive metabolic panel  Result Value Ref Range   Sodium 136 135 - 145 mmol/L   Potassium 3.9 3.5 - 5.1 mmol/L   Chloride 98 98 - 111 mmol/L   CO2 30 22 - 32 mmol/L   Glucose, Bld 82 70 - 99 mg/dL   BUN 13 6 - 20 mg/dL   Creatinine, Ser 8.93 0.44 - 1.00 mg/dL    Calcium 9.1 8.9 - 81.0 mg/dL   Total Protein 7.7 6.5 - 8.1 g/dL   Albumin 4.6 3.5 - 5.0 g/dL   AST 17 15 - 41 U/L   ALT 13 0 - 44 U/L   Alkaline Phosphatase 60 38 - 126 U/L   Total Bilirubin 0.7 0.3 - 1.2 mg/dL   GFR, Estimated >17 >51 mL/min   Anion gap 8 5 - 15      Assessment & Plan:   Encounter Diagnoses  Name Primary?   Chronic obstructive pulmonary disease, unspecified COPD type (HCC) Yes   Tobacco use disorder    Anxiety with depression    Gastroesophageal reflux disease, unspecified whether esophagitis present        -reviewed labs with pt -pt to continue current rx -pt is encouraged to return [employer] application to office so her advair can be ordered -pt says she will call to reschedule the mammogram she missed -she is reminded to return her FIT test given last month for colon cancer screening -pt tofollow up 3 months with update PAP at that time

## 2021-12-29 ENCOUNTER — Emergency Department (HOSPITAL_COMMUNITY)
Admission: EM | Admit: 2021-12-29 | Discharge: 2021-12-29 | Disposition: A | Discharge status: Home / Self Care | Payer: Self-pay | Attending: Emergency Medicine | Admitting: Emergency Medicine

## 2021-12-29 ENCOUNTER — Encounter (HOSPITAL_COMMUNITY): Payer: Self-pay | Admitting: *Deleted

## 2021-12-29 DIAGNOSIS — M545 Low back pain, unspecified: Secondary | ICD-10-CM | POA: Insufficient documentation

## 2021-12-29 HISTORY — DX: Depression, unspecified: F32.A

## 2021-12-29 HISTORY — DX: Anxiety disorder, unspecified: F41.9

## 2021-12-29 HISTORY — DX: Gastro-esophageal reflux disease without esophagitis: K21.9

## 2021-12-29 MED ORDER — OXYCODONE-ACETAMINOPHEN 5-325 MG PO TABS: 1.0000 | ORAL_TABLET | Freq: Four times a day (QID) | ORAL | 0 refills | Status: DC | PRN

## 2021-12-29 MED ORDER — METHOCARBAMOL 500 MG PO TABS
500.0000 mg | ORAL_TABLET | Freq: Once | ORAL | Status: AC
  Administered 2021-12-29: 500 mg via ORAL
  Filled 2021-12-29: qty 1

## 2021-12-29 MED ORDER — NAPROXEN 500 MG PO TABS: 500.0000 mg | ORAL_TABLET | Freq: Two times a day (BID) | ORAL | 0 refills | Status: DC

## 2021-12-29 MED ORDER — OXYCODONE-ACETAMINOPHEN 5-325 MG PO TABS
1.0000 | ORAL_TABLET | Freq: Once | ORAL | Status: AC
  Administered 2021-12-29: 1 via ORAL
  Filled 2021-12-29: qty 1

## 2021-12-29 MED ORDER — METHOCARBAMOL 500 MG PO TABS
500.0000 mg | ORAL_TABLET | Freq: Two times a day (BID) | ORAL | 0 refills | Status: DC
Start: 2021-12-29 — End: 2024-05-09

## 2021-12-29 MED ORDER — NAPROXEN 250 MG PO TABS
500.0000 mg | ORAL_TABLET | Freq: Once | ORAL | Status: AC
  Administered 2021-12-29: 500 mg via ORAL
  Filled 2021-12-29: qty 2

## 2021-12-29 NOTE — ED Triage Notes (Signed)
Recurrent back pain °

## 2021-12-29 NOTE — Discharge Instructions (Signed)
Take the medicines as prescribed.  Do not drive within 4 hours of taking oxycodone as this will make you drowsy.  Avoid lifting,  Bending,  Twisting or any other activity that worsens your pain over the next week.  Apply a heating pad to your lower back 20 minutes several times daily.  You should get rechecked if your symptoms are not better over the next 5 days,  Or you develop increased pain,  Weakness in your leg(s) or loss of bladder or bowel function - these can be symptoms of a worsening low back condition.

## 2021-12-29 NOTE — ED Provider Notes (Signed)
[facility] EMERGENCY DEPARTMENT Provider Note   CSN: [id number] Arrival date & time: 12/29/21  1657     History  Chief Complaint  Patient presents with   Back Pain    [name] is a 46 y.o. female presenting with acute low back pain which has which has been present since yesterday.  She was cooking in her kitchen when she bent over and had sudden onset of pain radiating across her lower back.  She reports prior episodes of similar pain, at times with radiation down the left thigh which is not present today.   Patient denies any new injury specifically.   There has been no weakness or numbness in the lower extremities and no urinary or bowel retention or incontinence.  Patient does not have a history of cancer or IVDU.  The patient has tried ibuprofen and rest without significant relief of symptoms.   The history is provided by the patient.      Home Medications Prior to Admission medications   Medication Sig Start Date End Date Taking? Authorizing Provider  methocarbamol (ROBAXIN) 500 MG tablet Take 1 tablet (500 mg total) by mouth 2 (two) times daily. 12/29/21  Yes Lilliona Blakeney, Raynelle Fanning, PA-C  naproxen (NAPROSYN) 500 MG tablet Take 1 tablet (500 mg total) by mouth 2 (two) times daily. 12/29/21  Yes Glyn Zendejas, Raynelle Fanning, PA-C  oxyCODONE-acetaminophen (PERCOCET/ROXICET) 5-325 MG tablet Take 1 tablet by mouth every 6 (six) hours as needed. 12/29/21  Yes Elston Aldape, Raynelle Fanning, PA-C  albuterol (VENTOLIN HFA) 108 (90 Base) MCG/ACT inhaler Inhale 2 puffs into the lungs every 6 (six) hours as needed for wheezing or shortness of breath. 09/29/21   Jacquelin Hawking, PA-C  DULoxetine (CYMBALTA) 30 MG capsule Take 1 capsule (30 mg total) by mouth daily. 09/29/21   Jacquelin Hawking, PA-C  Mometasone Furoate (ASMANEX HFA) 200 MCG/ACT AERO Inhale 1 puff into the lungs 2 (two) times daily. 09/29/21   Jacquelin Hawking, PA-C  omeprazole (PRILOSEC) 40 MG capsule Take 1 capsule (40 mg total) by mouth daily. 09/29/21   Jacquelin Hawking, PA-C  ondansetron (ZOFRAN) 4 MG tablet Take 1 tablet (4 mg total) by mouth every 6 (six) hours. 09/04/21   Wurst, [place], PA-C      Allergies    Patient has no known allergies.    Review of Systems   Review of Systems  Constitutional:  Negative for fever.  Respiratory:  Negative for shortness of breath.   Cardiovascular:  Negative for chest pain and leg swelling.  Gastrointestinal:  Negative for abdominal distention, abdominal pain and constipation.  Genitourinary:  Negative for difficulty urinating, dysuria, flank pain, frequency and urgency.  Musculoskeletal:  Positive for back pain. Negative for gait problem and joint swelling.  Skin:  Negative for rash.  Neurological:  Negative for weakness and numbness.  All other systems reviewed and are negative.  Physical Exam Updated Vital Signs BP 119/82    Pulse 98    Temp 98.2 F (36.8 C) (Oral)    Resp 20    SpO2 100%  Physical Exam Vitals and nursing note reviewed.  Constitutional:      Appearance: She is well-developed.  HENT:     Head: Normocephalic.  Eyes:     Conjunctiva/sclera: Conjunctivae normal.  Cardiovascular:     Rate and Rhythm: Normal rate.     Pulses: Normal pulses.     Comments: Pedal pulses normal. Pulmonary:     Effort: Pulmonary effort is normal.  Abdominal:  General: Bowel sounds are normal. There is no distension.     Palpations: Abdomen is soft. There is no mass.  Musculoskeletal:        General: Normal range of motion.     Cervical back: Normal range of motion and neck supple.     Lumbar back: Tenderness present. No swelling, edema or spasms.  Skin:    General: Skin is warm and dry.  Neurological:     General: No focal deficit present.     Mental Status: She is alert.     Sensory: No sensory deficit.     Motor: No tremor or atrophy.     Gait: Gait normal.     Deep Tendon Reflexes:     Reflex Scores:      Patellar reflexes are 2+ on the right side and 2+ on the left side.       Achilles reflexes are 2+ on the right side and 2+ on the left side.    Comments: No strength deficit noted in hip and knee flexor and extensor muscle groups.  Ankle flexion and extension intact.    ED Results / Procedures / Treatments   Labs (all labs ordered are listed, but only abnormal results are displayed) Labs Reviewed - No data to display  EKG None  Radiology No results found.  Procedures Procedures    Medications Ordered in ED Medications  naproxen (NAPROSYN) tablet 500 mg (500 mg Oral Given 12/29/21 1805)  oxyCODONE-acetaminophen (PERCOCET/ROXICET) 5-325 MG per tablet 1 tablet (1 tablet Oral Given 12/29/21 1805)  methocarbamol (ROBAXIN) tablet 500 mg (500 mg Oral Given 12/29/21 1804)    ED Course/ Medical Decision Making/ A&P                           Medical Decision Making Patient with acute on chronic intermittent low back pain.  There are no red flag findings on today's exam to suggest surgical emergency.  Doubt cauda equina.  No urinary complaints.  Amount and/or Complexity of Data Reviewed External Data Reviewed: radiology.    Details: Prior recent imaging including a CT renal study completed in September, negative study including no obvious lumbar bony abnormalities.  Risk Prescription drug management.   No neuro deficit on exam or by history to suggest emergent or surgical presentation.  Also discussed worsened sx that should prompt immediate re-evaluation including distal weakness, bowel/bladder retention/incontinence.         Final Clinical Impression(s) / ED Diagnoses Final diagnoses:  Acute bilateral low back pain without sciatica    Rx / DC Orders ED Discharge Orders          Ordered    methocarbamol (ROBAXIN) 500 MG tablet  2 times daily        12/29/21 1841    oxyCODONE-acetaminophen (PERCOCET/ROXICET) 5-325 MG tablet  Every 6 hours PRN        12/29/21 1841    naproxen (NAPROSYN) 500 MG tablet  2 times daily        12/29/21 1841               Burgess Amor, PA-C 12/29/21 1933    Gloris Manchester, MD 12/31/21 1948

## 2021-12-30 ENCOUNTER — Ambulatory Visit: Payer: Self-pay | Admitting: Physician Assistant

## 2022-02-01 ENCOUNTER — Ambulatory Visit: Payer: Self-pay | Admitting: Physician Assistant

## 2022-08-22 DIAGNOSIS — R69 Illness, unspecified: Secondary | ICD-10-CM | POA: Diagnosis not present

## 2022-08-22 DIAGNOSIS — F1120 Opioid dependence, uncomplicated: Secondary | ICD-10-CM | POA: Diagnosis not present

## 2022-08-22 DIAGNOSIS — G4700 Insomnia, unspecified: Secondary | ICD-10-CM | POA: Diagnosis not present

## 2022-09-06 DIAGNOSIS — Z6823 Body mass index (BMI) 23.0-23.9, adult: Secondary | ICD-10-CM | POA: Diagnosis not present

## 2022-09-06 DIAGNOSIS — R69 Illness, unspecified: Secondary | ICD-10-CM | POA: Diagnosis not present

## 2022-09-06 DIAGNOSIS — M549 Dorsalgia, unspecified: Secondary | ICD-10-CM | POA: Diagnosis not present

## 2022-09-06 DIAGNOSIS — J4530 Mild persistent asthma, uncomplicated: Secondary | ICD-10-CM | POA: Diagnosis not present

## 2022-09-06 DIAGNOSIS — K219 Gastro-esophageal reflux disease without esophagitis: Secondary | ICD-10-CM | POA: Diagnosis not present

## 2022-09-07 DIAGNOSIS — F1120 Opioid dependence, uncomplicated: Secondary | ICD-10-CM | POA: Diagnosis not present

## 2022-09-07 DIAGNOSIS — R69 Illness, unspecified: Secondary | ICD-10-CM | POA: Diagnosis not present

## 2022-09-07 DIAGNOSIS — G4700 Insomnia, unspecified: Secondary | ICD-10-CM | POA: Diagnosis not present

## 2022-09-13 ENCOUNTER — Ambulatory Visit
Admission: EM | Admit: 2022-09-13 | Discharge: 2022-09-13 | Disposition: A | Discharge status: Home / Self Care | Payer: 59 | Attending: Family Medicine | Admitting: Family Medicine

## 2022-09-13 DIAGNOSIS — L02419 Cutaneous abscess of limb, unspecified: Secondary | ICD-10-CM | POA: Diagnosis not present

## 2022-09-13 MED ORDER — CHLORHEXIDINE GLUCONATE 4 % EX LIQD: Freq: Every day | CUTANEOUS | 0 refills | Status: DC | PRN

## 2022-09-13 MED ORDER — SULFAMETHOXAZOLE-TRIMETHOPRIM 800-160 MG PO TABS: 1.0000 | ORAL_TABLET | Freq: Two times a day (BID) | ORAL | 0 refills | Status: AC

## 2022-09-13 NOTE — Discharge Instructions (Signed)
Ply warm compresses, clean the area with the Hibiclens solution once to twice daily, take the full course of antibiotics.  If your symptoms worsen or do not get significantly better follow-up for a recheck

## 2022-09-13 NOTE — ED Triage Notes (Signed)
Pt presents with large abscess to right axilla for past week

## 2022-09-13 NOTE — ED Provider Notes (Signed)
[facility]    CSN: [id number] Arrival date & time: 09/13/22  1746      History   Chief Complaint Chief Complaint  Patient presents with   Abscess    Boil or abscess under the arm. - Entered by patient    HPI [name] is a 46 y.o. female.   Patient presenting today with 1 week history of progressively worsening small red swollen areas to the right underarm.  Denies injury to the area, new deodorants or soaps, insect bites.  The areas are very painful, seem to be waxing and waning.  No drainage, bleeding, fevers, chills, body aches.  Put some fat back on the area with minimal relief.    Past Medical History:  Diagnosis Date   Anxiety    Asthma    Depression    GERD (gastroesophageal reflux disease)    Stroke [facility])     Patient Active Problem List   Diagnosis Date Noted   Macrocytic anemia 04/12/2014   Abdominal pain 04/07/2014   Nausea and vomiting in adult 04/07/2014   Acute diarrhea 04/07/2014   Acute pancreatitis 04/07/2014   Asthma 04/07/2014    Past Surgical History:  Procedure Laterality Date   BLADDER SURGERY     CHOLECYSTECTOMY     ESOPHAGOGASTRODUODENOSCOPY N/A 04/10/2014   Procedure: ESOPHAGOGASTRODUODENOSCOPY (EGD);  Surgeon: Malissa Hippo, MD;  Location: [facility];  Service: Endoscopy;  Laterality: N/A;    OB History     Gravida  3   Para  2   Term  2   Preterm      AB  1   Living         SAB  1   IAB      Ectopic      Multiple      Live Births               Home Medications    Prior to Admission medications   Medication Sig Start Date End Date Taking? Authorizing Provider  chlorhexidine (HIBICLENS) 4 % external liquid Apply topically daily as needed. 09/13/22  Yes Particia Nearing, PA-C  sulfamethoxazole-trimethoprim (BACTRIM DS) 800-160 MG tablet Take 1 tablet by mouth 2 (two) times daily for 10 days. 09/13/22 09/23/22 Yes Particia Nearing, PA-C  albuterol (VENTOLIN HFA) 108 (90  Base) MCG/ACT inhaler Inhale 2 puffs into the lungs every 6 (six) hours as needed for wheezing or shortness of breath. 09/29/21   Jacquelin Hawking, PA-C  DULoxetine (CYMBALTA) 30 MG capsule Take 1 capsule (30 mg total) by mouth daily. 09/29/21   Jacquelin Hawking, PA-C  methocarbamol (ROBAXIN) 500 MG tablet Take 1 tablet (500 mg total) by mouth 2 (two) times daily. 12/29/21   Idol, Raynelle Fanning, PA-C  Mometasone Furoate (ASMANEX HFA) 200 MCG/ACT AERO Inhale 1 puff into the lungs 2 (two) times daily. 09/29/21   Jacquelin Hawking, PA-C  naproxen (NAPROSYN) 500 MG tablet Take 1 tablet (500 mg total) by mouth 2 (two) times daily. 12/29/21   Burgess Amor, PA-C  omeprazole (PRILOSEC) 40 MG capsule Take 1 capsule (40 mg total) by mouth daily. 09/29/21   Jacquelin Hawking, PA-C  ondansetron (ZOFRAN) 4 MG tablet Take 1 tablet (4 mg total) by mouth every 6 (six) hours. 09/04/21   Wurst, [place], PA-C  oxyCODONE-acetaminophen (PERCOCET/ROXICET) 5-325 MG tablet Take 1 tablet by mouth every 6 (six) hours as needed. 12/29/21   Burgess Amor, PA-C    Family History Family History  Problem Relation  Age of Onset   Cancer Mother    Pancreatic cancer Mother    Stroke Mother    Seizures Mother    Diabetes Father    Hypertension Father     Social History Social History   Tobacco Use   Smoking status: Every Day    Packs/day: 1.00    Years: 13.00    Total pack years: 13.00    Types: Cigarettes   Smokeless tobacco: Never  Vaping Use   Vaping Use: Never used  Substance Use Topics   Alcohol use: No   Drug use: Not Currently    Types: Marijuana    Comment: no MJ since age 68 per pt but + UDS 2008     Allergies   Patient has no known allergies.   Review of Systems Review of Systems Per HPI  Physical Exam Triage Vital Signs ED Triage Vitals  Enc Vitals Group     BP 09/13/22 1826 107/69     Pulse Rate 09/13/22 1826 65     Resp 09/13/22 1826 18     Temp 09/13/22 1826 98 F (36.7 C)     Temp src --       SpO2 09/13/22 1826 98 %     Weight --      Height --      Head Circumference --      Peak Flow --      Pain Score 09/13/22 1827 8     Pain Loc --      Pain Edu? --      Excl. in [place]? --    No data found.  Updated Vital Signs BP 107/69   Pulse 65   Temp 98 F (36.7 C)   Resp 18   SpO2 98%   Visual Acuity Right Eye Distance:   Left Eye Distance:   Bilateral Distance:    Right Eye Near:   Left Eye Near:    Bilateral Near:     Physical Exam Vitals and nursing note reviewed.  Constitutional:      Appearance: Normal appearance. She is not ill-appearing.  HENT:     Head: Atraumatic.  Eyes:     Extraocular Movements: Extraocular movements intact.     Conjunctiva/sclera: Conjunctivae normal.  Cardiovascular:     Rate and Rhythm: Normal rate and regular rhythm.     Heart sounds: Normal heart sounds.  Pulmonary:     Effort: Pulmonary effort is normal.     Breath sounds: Normal breath sounds.  Musculoskeletal:        General: Normal range of motion.     Cervical back: Normal range of motion and neck supple.  Skin:    General: Skin is warm and dry.     Findings: Erythema present.     Comments: 5 or 6 firm erythematous nodular lesions to the right underarm, no fluctuance or induration, no active drainage.  All tender to palpation.  1 with a pustular center  Neurological:     Mental Status: She is alert and oriented to person, place, and time.  Psychiatric:        Mood and Affect: Mood normal.        Thought Content: Thought content normal.        Judgment: Judgment normal.      UC Treatments / Results  Labs (all labs ordered are listed, but only abnormal results are displayed) Labs Reviewed - No data to display  EKG   Radiology No results  found.  Procedures Procedures (including critical care time)  Medications Ordered in UC Medications - No data to display  Initial Impression / Assessment and Plan / UC Course  I have reviewed the triage vital signs and  the nursing notes.  Pertinent labs & imaging results that were available during my care of the patient were reviewed by me and considered in my medical decision making (see chart for details).     We will forego I&D today as the areas are all small and nonfluctuant, treat with extended course of Bactrim, Hibiclens, warm compresses.  Return for worsening symptoms.  Final Clinical Impressions(s) / UC Diagnoses   Final diagnoses:  Axillary abscess     Discharge Instructions      Ply warm compresses, clean the area with the Hibiclens solution once to twice daily, take the full course of antibiotics.  If your symptoms worsen or do not get significantly better follow-up for a recheck    ED Prescriptions     Medication Sig Dispense Auth. Provider   sulfamethoxazole-trimethoprim (BACTRIM DS) 800-160 MG tablet Take 1 tablet by mouth 2 (two) times daily for 10 days. 20 tablet Volney American, [place]   chlorhexidine (HIBICLENS) 4 % external liquid Apply topically daily as needed. 120 mL Volney American, [place]      PDMP not reviewed this encounter.   Volney American, [place] 09/13/22 1909

## 2022-11-23 DIAGNOSIS — Z6824 Body mass index (BMI) 24.0-24.9, adult: Secondary | ICD-10-CM | POA: Diagnosis not present

## 2022-11-23 DIAGNOSIS — J206 Acute bronchitis due to rhinovirus: Secondary | ICD-10-CM | POA: Diagnosis not present

## 2022-12-08 DIAGNOSIS — R0789 Other chest pain: Secondary | ICD-10-CM | POA: Diagnosis not present

## 2022-12-08 DIAGNOSIS — R69 Illness, unspecified: Secondary | ICD-10-CM | POA: Diagnosis not present

## 2022-12-08 DIAGNOSIS — Z79899 Other long term (current) drug therapy: Secondary | ICD-10-CM | POA: Diagnosis not present

## 2022-12-08 DIAGNOSIS — J209 Acute bronchitis, unspecified: Secondary | ICD-10-CM | POA: Diagnosis not present

## 2022-12-08 DIAGNOSIS — R062 Wheezing: Secondary | ICD-10-CM | POA: Diagnosis not present

## 2022-12-08 DIAGNOSIS — R079 Chest pain, unspecified: Secondary | ICD-10-CM | POA: Diagnosis not present

## 2022-12-08 DIAGNOSIS — R059 Cough, unspecified: Secondary | ICD-10-CM | POA: Diagnosis not present

## 2022-12-08 DIAGNOSIS — J40 Bronchitis, not specified as acute or chronic: Secondary | ICD-10-CM | POA: Diagnosis not present

## 2022-12-08 DIAGNOSIS — J449 Chronic obstructive pulmonary disease, unspecified: Secondary | ICD-10-CM | POA: Diagnosis not present

## 2022-12-08 DIAGNOSIS — Z20822 Contact with and (suspected) exposure to covid-19: Secondary | ICD-10-CM | POA: Diagnosis not present

## 2023-01-20 IMAGING — CT CT RENAL STONE PROTOCOL
2 of 4 series · 16 of 46 positions shown, 18 images · non-contrast
Comparison: CT August 26, 2014

CLINICAL DATA: Left flank pain concern for renal stone

EXAM:
CT ABDOMEN AND PELVIS WITHOUT CONTRAST
TECHNIQUE: Multidetector CT imaging of the abdomen and pelvis was performed
following the standard protocol without IV contrast.

[Series 2: axial st · axial · 0.75mm/px · z∈[+869,+1259]mm · 13 of 90 slices shown, 15 images]
[im 6/90  soft-tissue]
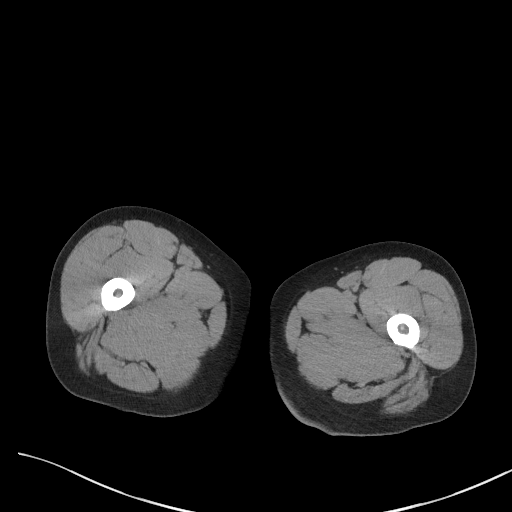
[im 6/90  bone]
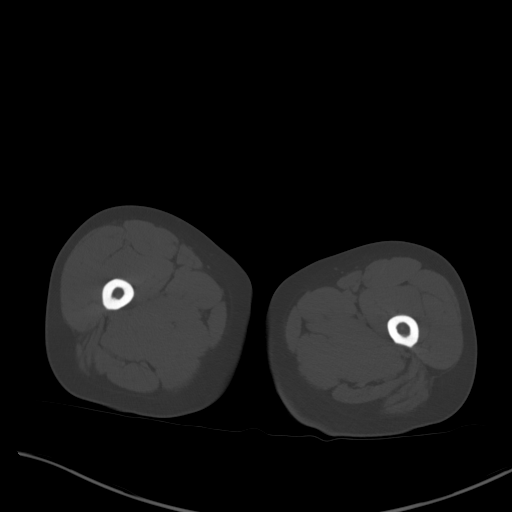
[im 12/90  soft-tissue]
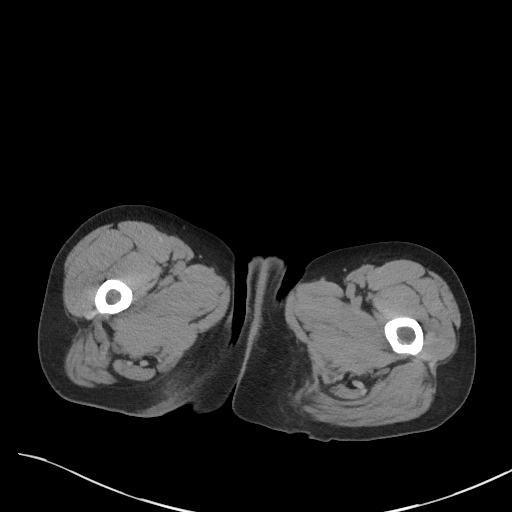
[im 17/90  soft-tissue]
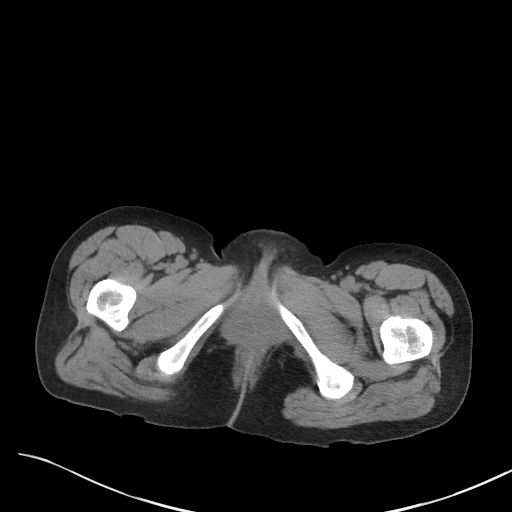
[im 28/90  soft-tissue]
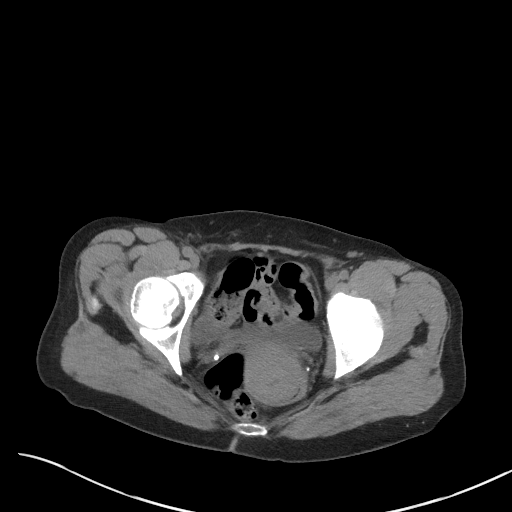
[im 34/90  soft-tissue]
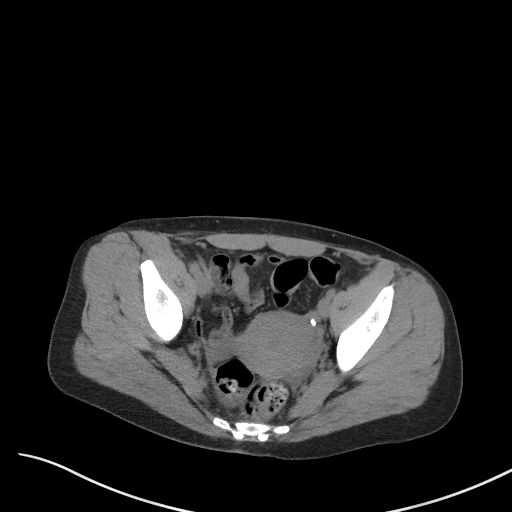
[im 39/90  soft-tissue]
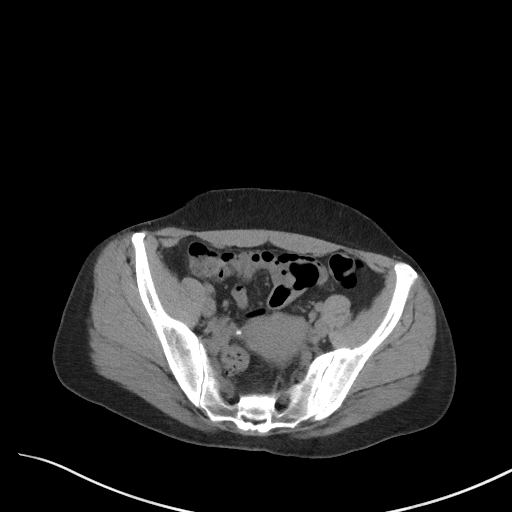
[im 45/90  soft-tissue]
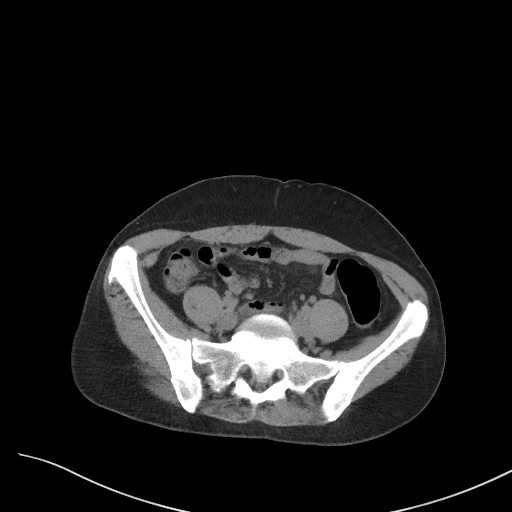
[im 51/90  soft-tissue]
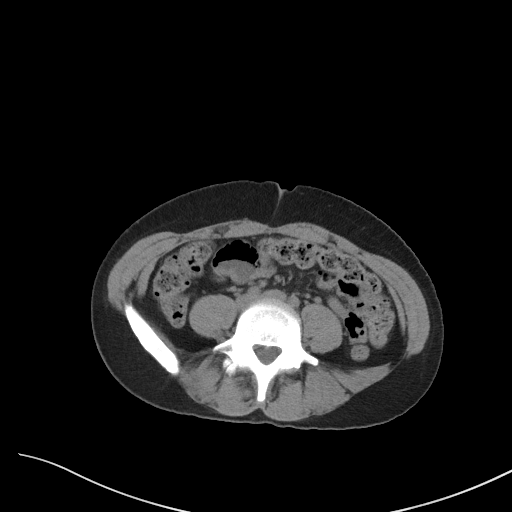
[im 56/90  soft-tissue]
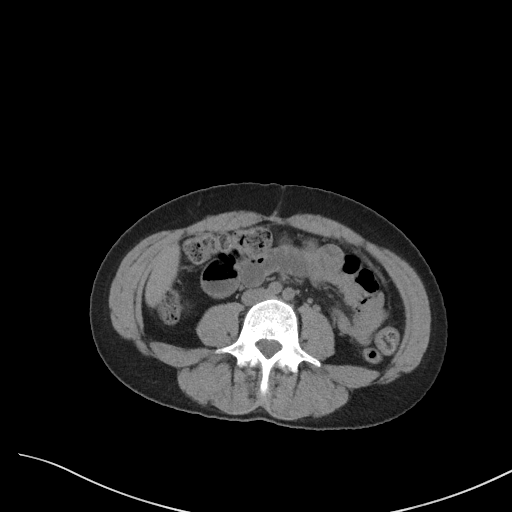
[im 56/90  bone]
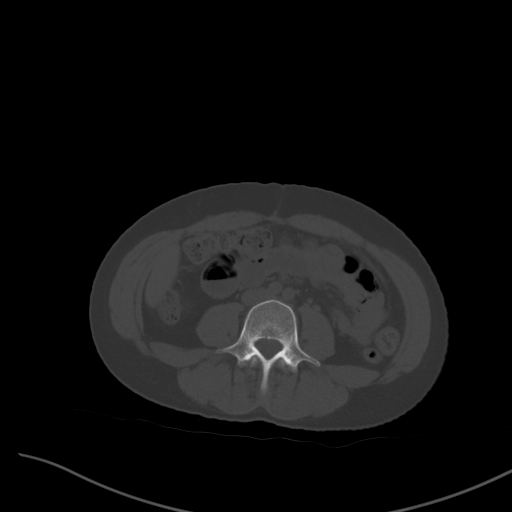
[im 62/90  soft-tissue]
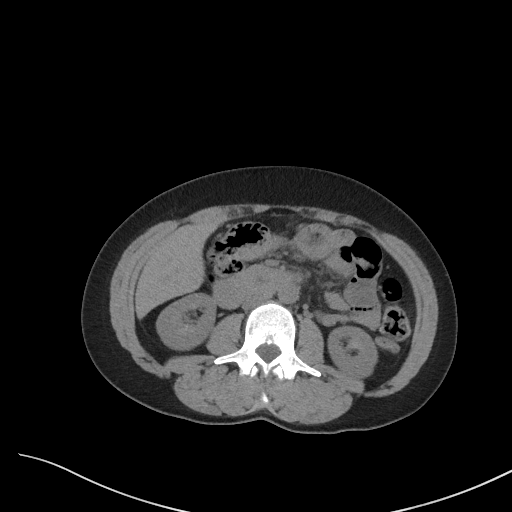
[im 73/90  soft-tissue]
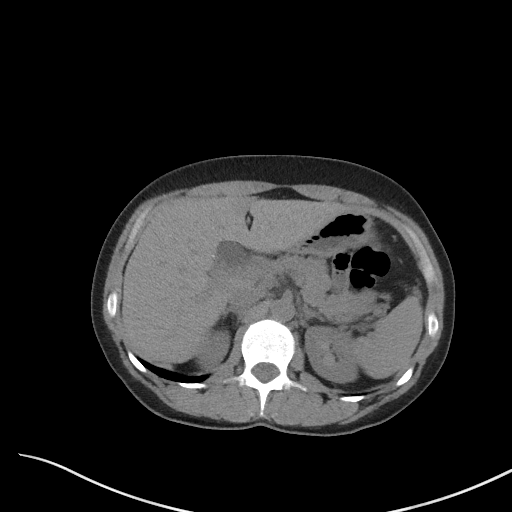
[im 78/90  soft-tissue]
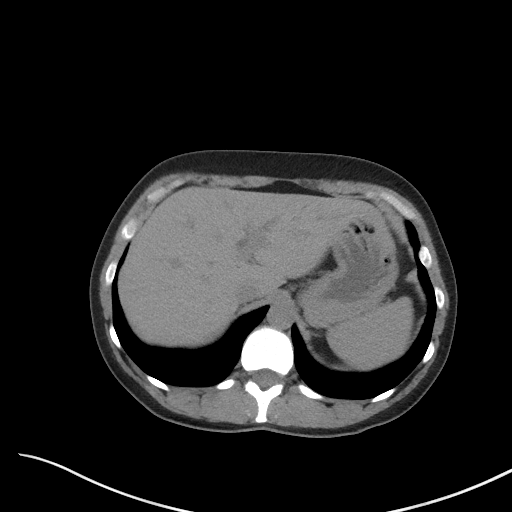
[im 84/90  soft-tissue]
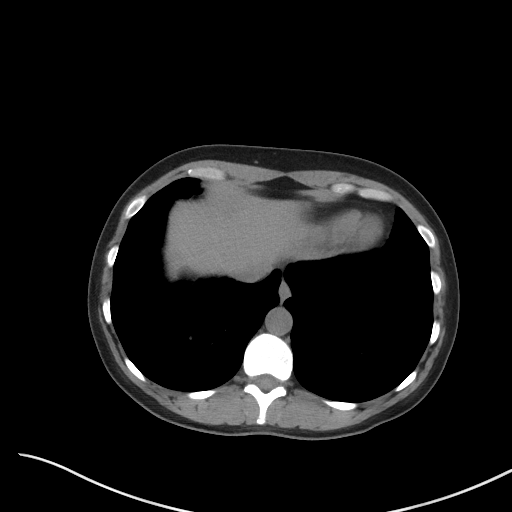

[Series 5: coronal st · coronal · 0.65mm/px · 3 of 86 slices shown]
[im 29/86  soft-tissue]
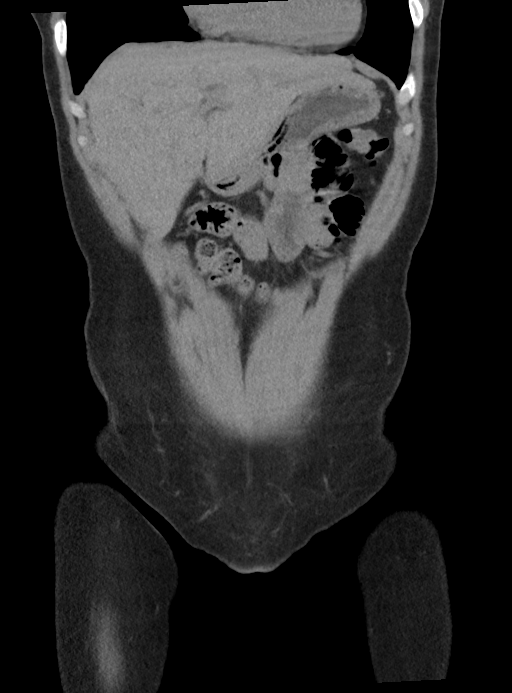
[im 38/86  soft-tissue]
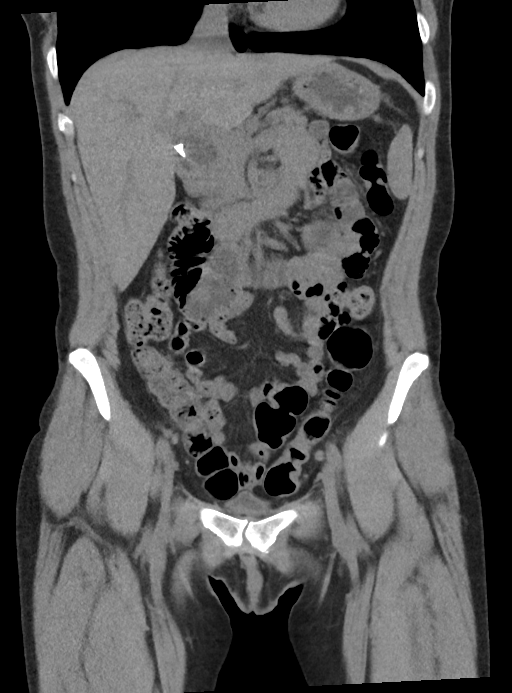
[im 48/86  soft-tissue]
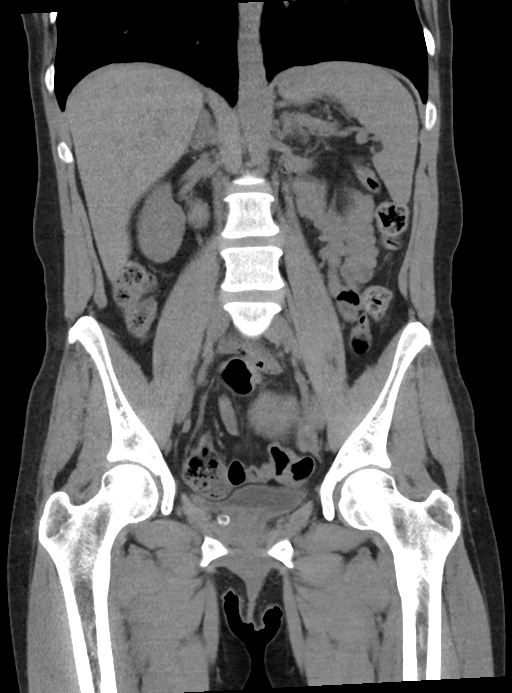

[16 of 46 positions shown; findings below may reference images not displayed]

FINDINGS: Lower chest: No acute abnormality.

Hepatobiliary: Unremarkable noncontrast appearance of the hepatic
parenchyma. Gallbladder surgically absent. Similar dilation of the
extrahepatic biliary tree and prominence of the intrahepatic biliary
tree with the common bile duct measuring 14 mm, unchanged and
favored reservoir effect post cholecystectomy.

Pancreas: Unremarkable noncontrast appearance of the pancreatic
parenchyma. No pancreatic ductal dilation.

Spleen: Within normal limits.

Adrenals/Urinary Tract: Bilateral adrenal glands are unremarkable.
No hydronephrosis. No renal, ureteral or bladder calculi visualized.
Urinary bladder is decompressed.

Stomach/Bowel: Stomach is unremarkable for degree of distension. No
pathologic dilation of small or large bowel. The appendix and
terminal ileum appear normal. Mild diffuse colonic diverticulosis
without findings of acute diverticulitis.

Vascular/Lymphatic: No significant vascular findings are present. No
enlarged abdominal or pelvic lymph nodes.

Reproductive: Uterus and bilateral adnexa are unremarkable.

Other: Physiologic volume of pelvic free fluid.

Musculoskeletal: Very mild lower lumbar spondylosis. No acute
osseous abnormality.
IMPRESSION: 1. No acute abdominopelvic findings. Specifically, no evidence of
obstructive uropathy.
2. Mild diffuse colonic diverticulosis without findings of acute
diverticulitis.
3. Similar dilation of the extrahepatic biliary tree and prominence
of the intrahepatic biliary tree with the common bile duct measuring
14 mm, unchanged and favored reservoir effect post cholecystectomy.

## 2023-02-09 DIAGNOSIS — F17200 Nicotine dependence, unspecified, uncomplicated: Secondary | ICD-10-CM | POA: Diagnosis not present

## 2023-02-09 DIAGNOSIS — R32 Unspecified urinary incontinence: Secondary | ICD-10-CM | POA: Diagnosis not present

## 2023-02-09 DIAGNOSIS — G8929 Other chronic pain: Secondary | ICD-10-CM | POA: Diagnosis not present

## 2023-02-09 DIAGNOSIS — Z823 Family history of stroke: Secondary | ICD-10-CM | POA: Diagnosis not present

## 2023-02-09 DIAGNOSIS — F1120 Opioid dependence, uncomplicated: Secondary | ICD-10-CM | POA: Diagnosis not present

## 2023-02-09 DIAGNOSIS — Z8249 Family history of ischemic heart disease and other diseases of the circulatory system: Secondary | ICD-10-CM | POA: Diagnosis not present

## 2023-02-09 DIAGNOSIS — J449 Chronic obstructive pulmonary disease, unspecified: Secondary | ICD-10-CM | POA: Diagnosis not present

## 2023-02-09 DIAGNOSIS — Z811 Family history of alcohol abuse and dependence: Secondary | ICD-10-CM | POA: Diagnosis not present

## 2023-02-09 DIAGNOSIS — K219 Gastro-esophageal reflux disease without esophagitis: Secondary | ICD-10-CM | POA: Diagnosis not present

## 2023-02-09 DIAGNOSIS — Z825 Family history of asthma and other chronic lower respiratory diseases: Secondary | ICD-10-CM | POA: Diagnosis not present

## 2023-02-09 DIAGNOSIS — Z833 Family history of diabetes mellitus: Secondary | ICD-10-CM | POA: Diagnosis not present

## 2023-02-09 DIAGNOSIS — Z7951 Long term (current) use of inhaled steroids: Secondary | ICD-10-CM | POA: Diagnosis not present

## 2023-02-10 ENCOUNTER — Ambulatory Visit
Admission: RE | Admit: 2023-02-10 | Discharge: 2023-02-10 | Disposition: A | Discharge status: Home / Self Care | Payer: Medicaid Other | Source: Ambulatory Visit | Attending: Family Medicine | Admitting: Family Medicine

## 2023-02-10 ENCOUNTER — Other Ambulatory Visit: Payer: Self-pay

## 2023-02-10 VITALS — BP 130/81 | HR 70 | Temp 97.7°F | Resp 16

## 2023-02-10 DIAGNOSIS — M5431 Sciatica, right side: Secondary | ICD-10-CM | POA: Diagnosis not present

## 2023-02-10 MED ORDER — TIZANIDINE HCL 4 MG PO CAPS: 4.0000 mg | ORAL_CAPSULE | Freq: Three times a day (TID) | ORAL | 0 refills | Status: DC | PRN

## 2023-02-10 MED ORDER — PREDNISONE 20 MG PO TABS
40.0000 mg | ORAL_TABLET | Freq: Every day | ORAL | 0 refills | Status: DC
Start: 2023-02-10 — End: 2024-05-09

## 2023-02-10 NOTE — ED Provider Notes (Signed)
[facility]    CSN: [id number] Arrival date & time: 02/10/23  1821      History   Chief Complaint Chief Complaint  Patient presents with   Back Pain    Slip disk - Entered by patient    HPI [name] is a 47 y.o. female.   Pt reports was seen about 6 months ago and told had slip disk. Pt denies any recent injury but reports recent flare-up has lasted x2 weeks.otc ibuprofen has had minimal effect on pain. Denies any injury,urinary, or gi symptoms.       Past Medical History:  Diagnosis Date   Anxiety    Asthma    Depression    GERD (gastroesophageal reflux disease)    Stroke [facility])     Patient Active Problem List   Diagnosis Date Noted   Macrocytic anemia 04/12/2014   Abdominal pain 04/07/2014   Nausea and vomiting in adult 04/07/2014   Acute diarrhea 04/07/2014   Acute pancreatitis 04/07/2014   Asthma 04/07/2014    Past Surgical History:  Procedure Laterality Date   BLADDER SURGERY     CHOLECYSTECTOMY     ESOPHAGOGASTRODUODENOSCOPY N/A 04/10/2014   Procedure: ESOPHAGOGASTRODUODENOSCOPY (EGD);  Surgeon: Rogene Houston, MD;  Location: [facility];  Service: Endoscopy;  Laterality: N/A;    OB History     Gravida  3   Para  2   Term  2   Preterm      AB  1   Living         SAB  1   IAB      Ectopic      Multiple      Live Births               Home Medications    Prior to Admission medications   Medication Sig Start Date End Date Taking? Authorizing Provider  predniSONE (DELTASONE) 20 MG tablet Take 2 tablets (40 mg total) by mouth daily with breakfast. 02/10/23  Yes Volney American, PA-C  tiZANidine (ZANAFLEX) 4 MG capsule Take 1 capsule (4 mg total) by mouth 3 (three) times daily as needed for muscle spasms. Do not drink alcohol or drive while taking this medication.  May cause drowsiness. 02/10/23  Yes Volney American, PA-C  albuterol (VENTOLIN HFA) 108 (90 Base) MCG/ACT inhaler Inhale 2 puffs into  the lungs every 6 (six) hours as needed for wheezing or shortness of breath. 09/29/21   Soyla Dryer, PA-C  chlorhexidine (HIBICLENS) 4 % external liquid Apply topically daily as needed. 09/13/22   Volney American, PA-C  DULoxetine (CYMBALTA) 30 MG capsule Take 1 capsule (30 mg total) by mouth daily. 09/29/21   Soyla Dryer, PA-C  methocarbamol (ROBAXIN) 500 MG tablet Take 1 tablet (500 mg total) by mouth 2 (two) times daily. 12/29/21   Idol, Almyra Free, PA-C  Mometasone Furoate (ASMANEX HFA) 200 MCG/ACT AERO Inhale 1 puff into the lungs 2 (two) times daily. 09/29/21   Soyla Dryer, PA-C  naproxen (NAPROSYN) 500 MG tablet Take 1 tablet (500 mg total) by mouth 2 (two) times daily. 12/29/21   Evalee Jefferson, PA-C  omeprazole (PRILOSEC) 40 MG capsule Take 1 capsule (40 mg total) by mouth daily. 09/29/21   Soyla Dryer, PA-C  ondansetron (ZOFRAN) 4 MG tablet Take 1 tablet (4 mg total) by mouth every 6 (six) hours. 09/04/21   Wurst, [place], PA-C  oxyCODONE-acetaminophen (PERCOCET/ROXICET) 5-325 MG tablet Take 1 tablet by mouth every 6 (  six) hours as needed. 12/29/21   Evalee Jefferson, PA-C    Family History Family History  Problem Relation Age of Onset   Cancer Mother    Pancreatic cancer Mother    Stroke Mother    Seizures Mother    Diabetes Father    Hypertension Father     Social History Social History   Tobacco Use   Smoking status: Every Day    Packs/day: 1.00    Years: 13.00    Total pack years: 13.00    Types: Cigarettes   Smokeless tobacco: Never  Vaping Use   Vaping Use: Never used  Substance Use Topics   Alcohol use: No   Drug use: Not Currently    Types: Marijuana    Comment: no MJ since age 23 per pt but + UDS 2008     Allergies   Patient has no known allergies.   Review of Systems Review of Systems PER HPI  Physical Exam Triage Vital Signs ED Triage Vitals  Enc Vitals Group     BP 02/10/23 1827 130/81     Pulse Rate 02/10/23 1827 70     Resp  02/10/23 1827 16     Temp 02/10/23 1827 97.7 F (36.5 C)     Temp Source 02/10/23 1827 Oral     SpO2 02/10/23 1827 97 %     Weight --      Height --      Head Circumference --      Peak Flow --      Pain Score 02/10/23 1832 9     Pain Loc --      Pain Edu? --      Excl. in [place]? --    No data found.  Updated Vital Signs BP 130/81 (BP Location: Right Arm)   Pulse 70   Temp 97.7 F (36.5 C) (Oral)   Resp 16   LMP 01/23/2023 (Approximate)   SpO2 97%   Visual Acuity Right Eye Distance:   Left Eye Distance:   Bilateral Distance:    Right Eye Near:   Left Eye Near:    Bilateral Near:     Physical Exam Vitals and nursing note reviewed.  Constitutional:      Appearance: Normal appearance. She is not ill-appearing.  HENT:     Head: Atraumatic.  Eyes:     Extraocular Movements: Extraocular movements intact.     Conjunctiva/sclera: Conjunctivae normal.  Cardiovascular:     Rate and Rhythm: Normal rate and regular rhythm.     Heart sounds: Normal heart sounds.  Pulmonary:     Effort: Pulmonary effort is normal.     Breath sounds: Normal breath sounds.  Musculoskeletal:        General: Tenderness present. No swelling, deformity or signs of injury. Normal range of motion.     Cervical back: Normal range of motion and neck supple.     Comments: No midline spinal tenderness to palpation diffusely.  Negative straight leg raise bilateral lower extremities.  Tenderness to palpation over the right lateral lumbar musculature  Skin:    General: Skin is warm and dry.  Neurological:     Mental Status: She is alert and oriented to person, place, and time.     Motor: No weakness.     Gait: Gait normal.     Comments: Bilateral lower extremities neurovascularly intact  Psychiatric:        Mood and Affect: Mood normal.  Thought Content: Thought content normal.        Judgment: Judgment normal.      UC Treatments / Results  Labs (all labs ordered are listed, but only  abnormal results are displayed) Labs Reviewed - No data to display  EKG   Radiology No results found.  Procedures Procedures (including critical care time)  Medications Ordered in UC Medications - No data to display  Initial Impression / Assessment and Plan / UC Course  I have reviewed the triage vital signs and the nursing notes.  Pertinent labs & imaging results that were available during my care of the patient were reviewed by me and considered in my medical decision making (see chart for details).     Vitals and exam overall reassuring today with no red flag findings.  Treat for sciatica with prednisone, Zanaflex, stretches, massage, heat.  Return for worsening symptoms.  Final Clinical Impressions(s) / UC Diagnoses   Final diagnoses:  Right sided sciatica   Discharge Instructions   None    ED Prescriptions     Medication Sig Dispense Auth. Provider   predniSONE (DELTASONE) 20 MG tablet Take 2 tablets (40 mg total) by mouth daily with breakfast. 10 tablet Volney American, PA-C   tiZANidine (ZANAFLEX) 4 MG capsule Take 1 capsule (4 mg total) by mouth 3 (three) times daily as needed for muscle spasms. Do not drink alcohol or drive while taking this medication.  May cause drowsiness. 15 capsule Volney American, [place]      PDMP not reviewed this encounter.   Volney American, [place] 02/10/23 1900

## 2023-02-10 NOTE — ED Triage Notes (Signed)
Pt reports was seen about 6 months ago and told had slip disk. Pt denies any recent injury but reports recent flare-up has lasted x2 weeks.otc ibuprofen has had minimal effect on pain. Denies any injury,urinary, or gi symptoms.

## 2023-02-24 DIAGNOSIS — Z6827 Body mass index (BMI) 27.0-27.9, adult: Secondary | ICD-10-CM | POA: Diagnosis not present

## 2023-02-24 DIAGNOSIS — K219 Gastro-esophageal reflux disease without esophagitis: Secondary | ICD-10-CM | POA: Diagnosis not present

## 2023-02-24 DIAGNOSIS — J206 Acute bronchitis due to rhinovirus: Secondary | ICD-10-CM | POA: Diagnosis not present

## 2023-02-24 DIAGNOSIS — Z0000 Encounter for general adult medical examination without abnormal findings: Secondary | ICD-10-CM | POA: Diagnosis not present

## 2023-02-24 DIAGNOSIS — J4530 Mild persistent asthma, uncomplicated: Secondary | ICD-10-CM | POA: Diagnosis not present

## 2023-03-09 ENCOUNTER — Other Ambulatory Visit: Payer: Self-pay

## 2023-03-09 ENCOUNTER — Emergency Department (HOSPITAL_COMMUNITY)
Admission: EM | Admit: 2023-03-09 | Discharge: 2023-03-09 | Disposition: A | Discharge status: Home / Self Care | Payer: Medicaid Other | Attending: Emergency Medicine | Admitting: Emergency Medicine

## 2023-03-09 ENCOUNTER — Encounter (HOSPITAL_COMMUNITY): Payer: Self-pay | Admitting: Emergency Medicine

## 2023-03-09 DIAGNOSIS — M5441 Lumbago with sciatica, right side: Secondary | ICD-10-CM | POA: Insufficient documentation

## 2023-03-09 DIAGNOSIS — M549 Dorsalgia, unspecified: Secondary | ICD-10-CM | POA: Diagnosis present

## 2023-03-09 MED ORDER — KETOROLAC TROMETHAMINE 10 MG PO TABS: 10.0000 mg | ORAL_TABLET | Freq: Four times a day (QID) | ORAL | 0 refills | Status: DC | PRN

## 2023-03-09 MED ORDER — CYCLOBENZAPRINE HCL 5 MG PO TABS: 5.0000 mg | ORAL_TABLET | Freq: Two times a day (BID) | ORAL | 0 refills | Status: DC | PRN

## 2023-03-09 MED ORDER — CYCLOBENZAPRINE HCL 10 MG PO TABS
5.0000 mg | ORAL_TABLET | Freq: Once | ORAL | Status: AC
Start: 2023-03-09 — End: 2023-03-09
  Administered 2023-03-09: 5 mg via ORAL
  Filled 2023-03-09: qty 1

## 2023-03-09 MED ORDER — LIDOCAINE 5 % EX PTCH: 1.0000 | MEDICATED_PATCH | CUTANEOUS | 0 refills | Status: DC

## 2023-03-09 MED ORDER — LIDOCAINE 5 % EX PTCH
1.0000 | MEDICATED_PATCH | CUTANEOUS | Status: DC
  Administered 2023-03-09: 1 via TRANSDERMAL
  Filled 2023-03-09: qty 1

## 2023-03-09 MED ORDER — KETOROLAC TROMETHAMINE 15 MG/ML IJ SOLN
30.0000 mg | Freq: Once | INTRAMUSCULAR | Status: AC
  Administered 2023-03-09: 30 mg via INTRAMUSCULAR
  Filled 2023-03-09: qty 2

## 2023-03-09 NOTE — Discharge Instructions (Addendum)
Please return to the ER if you have any kind of fevers, chills, loss of urine, loss of bowel, inability urinate, or you cannot feel your thigh.  Take the medications as prescribed.  Do not use the muscle relaxers while driving as they will impair you.  Additionally do not take ibuprofen or naproxen or meloxicam with your Toradol.  Do not take any other muscle relaxers with the Flexeril.

## 2023-03-09 NOTE — ED Triage Notes (Signed)
Pt states pain in lower back from a known slipped disc and pain is radiating down the right leg causing right leg numbness. Took tylenol at 230pm today with no relief

## 2023-03-09 NOTE — ED Provider Notes (Signed)
[facility] Provider Note   CSN: [id number] Arrival date & time: 03/09/23  1722     History  Chief Complaint  Patient presents with   Back Pain    [name] is a 47 y.o. female, history of herniated disc, who presents to the ED secondary to right-sided leg numbness, tingling, and pain has been going on for the last week.  She states that it is very tender, and more painful when walking.  She has a known herniated disc, and she feels like she is having a flareup.  Has not taken anything for the pain.  Denies any loss of bowel bladder, or fever.  No recent falls or trauma.    Home Medications Prior to Admission medications   Medication Sig Start Date End Date Taking? Authorizing Provider  cyclobenzaprine (FLEXERIL) 5 MG tablet Take 1 tablet (5 mg total) by mouth 2 (two) times daily as needed for muscle spasms. 03/09/23  Yes Josejulian Tarango L, PA  ketorolac (TORADOL) 10 MG tablet Take 1 tablet (10 mg total) by mouth every 6 (six) hours as needed. 03/09/23  Yes Sonia Stickels L, PA  lidocaine (LIDODERM) 5 % Place 1 patch onto the skin daily. Remove & Discard patch within 12 hours or as directed by MD 03/09/23  Yes Lashone Stauber L, PA  albuterol (VENTOLIN HFA) 108 (90 Base) MCG/ACT inhaler Inhale 2 puffs into the lungs every 6 (six) hours as needed for wheezing or shortness of breath. 09/29/21   Soyla Dryer, PA-C  chlorhexidine (HIBICLENS) 4 % external liquid Apply topically daily as needed. 09/13/22   Volney American, PA-C  DULoxetine (CYMBALTA) 30 MG capsule Take 1 capsule (30 mg total) by mouth daily. 09/29/21   Soyla Dryer, PA-C  methocarbamol (ROBAXIN) 500 MG tablet Take 1 tablet (500 mg total) by mouth 2 (two) times daily. 12/29/21   Idol, Almyra Free, PA-C  Mometasone Furoate (ASMANEX HFA) 200 MCG/ACT AERO Inhale 1 puff into the lungs 2 (two) times daily. 09/29/21   Soyla Dryer, PA-C  naproxen (NAPROSYN) 500 MG tablet Take 1  tablet (500 mg total) by mouth 2 (two) times daily. 12/29/21   Evalee Jefferson, PA-C  omeprazole (PRILOSEC) 40 MG capsule Take 1 capsule (40 mg total) by mouth daily. 09/29/21   Soyla Dryer, PA-C  ondansetron (ZOFRAN) 4 MG tablet Take 1 tablet (4 mg total) by mouth every 6 (six) hours. 09/04/21   Wurst, [place], PA-C  oxyCODONE-acetaminophen (PERCOCET/ROXICET) 5-325 MG tablet Take 1 tablet by mouth every 6 (six) hours as needed. 12/29/21   Evalee Jefferson, PA-C  predniSONE (DELTASONE) 20 MG tablet Take 2 tablets (40 mg total) by mouth daily with breakfast. 02/10/23   Volney American, PA-C      Allergies    Patient has no known allergies.    Review of Systems   Review of Systems  Constitutional:  Negative for fever.  Musculoskeletal:  Positive for back pain.    Physical Exam Updated Vital Signs BP 126/85 (BP Location: Right Arm)   Pulse 87   Temp 98.1 F (36.7 C) (Oral)   Resp 18   Ht 4\' 10"  (1.473 m)   Wt 56.2 kg   LMP 02/24/2023 (Exact Date)   SpO2 100%   BMI 25.92 kg/m  Physical Exam Constitutional:      General: She is not in acute distress.    Appearance: She is well-developed. She is not toxic-appearing.  HENT:     Head:  Normocephalic and atraumatic.  Abdominal:     General: There is no distension.     Palpations: Abdomen is soft.     Tenderness: There is no abdominal tenderness.  Musculoskeletal:     Cervical back: Normal range of motion and neck supple. No spinous process tenderness or muscular tenderness.     Comments: No obvious deformity, appreciable swelling, erythema, ecchymosis, significant open wounds, or increased warmth.  Back: No point/focal vertebral tenderness, no palpable step off or crepitus.  Tenderness to palpation of right lumbar paraspinal muscles, to the right buttocks. Lower extremities: ranging @ all major joints. No focal bony tenderness .   Skin:    General: Skin is warm and dry.     Findings: No rash.  Neurological:     Mental Status:  She is alert.     Comments: Sensation grossly intact to bilateral lower extremities. 5/5 symmetric strength with plantar/dorsiflexion bilaterally. Gait is intact without obvious foot drop.  Positive straight leg raise to the right leg and     ED Results / Procedures / Treatments   Labs (all labs ordered are listed, but only abnormal results are displayed) Labs Reviewed - No data to display  EKG None  Radiology No results found.  Procedures Procedures    Medications Ordered in ED Medications  ketorolac (TORADOL) 15 MG/ML injection 30 mg (has no administration in time range)  cyclobenzaprine (FLEXERIL) tablet 5 mg (has no administration in time range)  lidocaine (LIDODERM) 5 % 1 patch (has no administration in time range)    ED Course/ Medical Decision Making/ A&P                             Medical Decision Making Patient is a 47 year old female, here for some right leg pain,/back pain, has been going on for the past [place].  History of herniated disc.  She Fraser Din does have positive straight leg raises on exam.  Denies any red flag symptoms relating to the back pain, including fever, chills, urinary symptoms, loss of bowel.  Believe that her pain is likely secondary to an exacerbation of her herniated disc.  We will give her anti-inflammatories, Flexeril, and lidocaine patches to help with pain control.  Return precautions emphasized, she is in agreement with plan.  Risk Prescription drug management.   Final Clinical Impression(s) / ED Diagnoses Final diagnoses:  Acute right-sided low back pain with right-sided sciatica    Rx / DC Orders ED Discharge Orders          Ordered    ketorolac (TORADOL) 10 MG tablet  Every 6 hours PRN        03/09/23 1744    lidocaine (LIDODERM) 5 %  Every 24 hours        03/09/23 1744    cyclobenzaprine (FLEXERIL) 5 MG tablet  2 times daily PRN        03/09/23 1744              [name], [place], PA 03/09/23 1745    [name], MD 03/10/23 1039

## 2023-03-25 DIAGNOSIS — F1110 Opioid abuse, uncomplicated: Secondary | ICD-10-CM | POA: Diagnosis not present

## 2023-03-25 DIAGNOSIS — F1120 Opioid dependence, uncomplicated: Secondary | ICD-10-CM | POA: Diagnosis not present

## 2023-04-08 DIAGNOSIS — F1110 Opioid abuse, uncomplicated: Secondary | ICD-10-CM | POA: Diagnosis not present

## 2023-04-08 DIAGNOSIS — F1120 Opioid dependence, uncomplicated: Secondary | ICD-10-CM | POA: Diagnosis not present

## 2023-05-04 DIAGNOSIS — F1120 Opioid dependence, uncomplicated: Secondary | ICD-10-CM | POA: Diagnosis not present

## 2023-05-26 DIAGNOSIS — F330 Major depressive disorder, recurrent, mild: Secondary | ICD-10-CM | POA: Diagnosis not present

## 2023-05-26 DIAGNOSIS — K219 Gastro-esophageal reflux disease without esophagitis: Secondary | ICD-10-CM | POA: Diagnosis not present

## 2023-05-26 DIAGNOSIS — J4530 Mild persistent asthma, uncomplicated: Secondary | ICD-10-CM | POA: Diagnosis not present

## 2023-05-26 DIAGNOSIS — N182 Chronic kidney disease, stage 2 (mild): Secondary | ICD-10-CM | POA: Diagnosis not present

## 2023-06-27 DIAGNOSIS — M5442 Lumbago with sciatica, left side: Secondary | ICD-10-CM | POA: Diagnosis not present

## 2023-06-27 DIAGNOSIS — M5441 Lumbago with sciatica, right side: Secondary | ICD-10-CM | POA: Diagnosis not present

## 2023-07-04 DIAGNOSIS — Z79899 Other long term (current) drug therapy: Secondary | ICD-10-CM | POA: Diagnosis not present

## 2023-07-04 DIAGNOSIS — F17200 Nicotine dependence, unspecified, uncomplicated: Secondary | ICD-10-CM | POA: Diagnosis not present

## 2023-07-04 DIAGNOSIS — F1120 Opioid dependence, uncomplicated: Secondary | ICD-10-CM | POA: Diagnosis not present

## 2023-07-08 DIAGNOSIS — F1120 Opioid dependence, uncomplicated: Secondary | ICD-10-CM | POA: Diagnosis not present

## 2023-07-11 DIAGNOSIS — F1120 Opioid dependence, uncomplicated: Secondary | ICD-10-CM | POA: Diagnosis not present

## 2023-07-11 DIAGNOSIS — F17200 Nicotine dependence, unspecified, uncomplicated: Secondary | ICD-10-CM | POA: Diagnosis not present

## 2023-07-11 DIAGNOSIS — Z79899 Other long term (current) drug therapy: Secondary | ICD-10-CM | POA: Diagnosis not present

## 2023-07-18 DIAGNOSIS — Z79899 Other long term (current) drug therapy: Secondary | ICD-10-CM | POA: Diagnosis not present

## 2023-07-18 DIAGNOSIS — F17200 Nicotine dependence, unspecified, uncomplicated: Secondary | ICD-10-CM | POA: Diagnosis not present

## 2023-07-18 DIAGNOSIS — F1120 Opioid dependence, uncomplicated: Secondary | ICD-10-CM | POA: Diagnosis not present

## 2023-07-19 DIAGNOSIS — F4381 Prolonged grief disorder: Secondary | ICD-10-CM | POA: Diagnosis not present

## 2023-07-19 DIAGNOSIS — F1120 Opioid dependence, uncomplicated: Secondary | ICD-10-CM | POA: Diagnosis not present

## 2023-07-19 DIAGNOSIS — F17200 Nicotine dependence, unspecified, uncomplicated: Secondary | ICD-10-CM | POA: Diagnosis not present

## 2023-07-25 DIAGNOSIS — F1120 Opioid dependence, uncomplicated: Secondary | ICD-10-CM | POA: Diagnosis not present

## 2023-07-25 DIAGNOSIS — F17200 Nicotine dependence, unspecified, uncomplicated: Secondary | ICD-10-CM | POA: Diagnosis not present

## 2023-07-25 DIAGNOSIS — Z79899 Other long term (current) drug therapy: Secondary | ICD-10-CM | POA: Diagnosis not present

## 2023-08-01 DIAGNOSIS — F17200 Nicotine dependence, unspecified, uncomplicated: Secondary | ICD-10-CM | POA: Diagnosis not present

## 2023-08-01 DIAGNOSIS — F1120 Opioid dependence, uncomplicated: Secondary | ICD-10-CM | POA: Diagnosis not present

## 2023-08-01 DIAGNOSIS — Z79899 Other long term (current) drug therapy: Secondary | ICD-10-CM | POA: Diagnosis not present

## 2023-08-03 DIAGNOSIS — F1120 Opioid dependence, uncomplicated: Secondary | ICD-10-CM | POA: Diagnosis not present

## 2023-08-04 DIAGNOSIS — F32A Depression, unspecified: Secondary | ICD-10-CM | POA: Diagnosis not present

## 2023-08-04 DIAGNOSIS — F17210 Nicotine dependence, cigarettes, uncomplicated: Secondary | ICD-10-CM | POA: Diagnosis not present

## 2023-08-04 DIAGNOSIS — R1013 Epigastric pain: Secondary | ICD-10-CM | POA: Diagnosis not present

## 2023-08-04 DIAGNOSIS — R1110 Vomiting, unspecified: Secondary | ICD-10-CM | POA: Diagnosis not present

## 2023-08-04 DIAGNOSIS — R197 Diarrhea, unspecified: Secondary | ICD-10-CM | POA: Diagnosis not present

## 2023-08-08 DIAGNOSIS — F1120 Opioid dependence, uncomplicated: Secondary | ICD-10-CM | POA: Diagnosis not present

## 2023-08-08 DIAGNOSIS — Z79899 Other long term (current) drug therapy: Secondary | ICD-10-CM | POA: Diagnosis not present

## 2023-08-08 DIAGNOSIS — F17200 Nicotine dependence, unspecified, uncomplicated: Secondary | ICD-10-CM | POA: Diagnosis not present

## 2023-08-18 DIAGNOSIS — F17200 Nicotine dependence, unspecified, uncomplicated: Secondary | ICD-10-CM | POA: Diagnosis not present

## 2023-08-18 DIAGNOSIS — Z79899 Other long term (current) drug therapy: Secondary | ICD-10-CM | POA: Diagnosis not present

## 2023-08-18 DIAGNOSIS — F1120 Opioid dependence, uncomplicated: Secondary | ICD-10-CM | POA: Diagnosis not present

## 2023-08-19 DIAGNOSIS — F1120 Opioid dependence, uncomplicated: Secondary | ICD-10-CM | POA: Diagnosis not present

## 2023-08-25 DIAGNOSIS — J4530 Mild persistent asthma, uncomplicated: Secondary | ICD-10-CM | POA: Diagnosis not present

## 2023-08-25 DIAGNOSIS — N182 Chronic kidney disease, stage 2 (mild): Secondary | ICD-10-CM | POA: Diagnosis not present

## 2023-08-25 DIAGNOSIS — F1120 Opioid dependence, uncomplicated: Secondary | ICD-10-CM | POA: Diagnosis not present

## 2023-08-25 DIAGNOSIS — Z79899 Other long term (current) drug therapy: Secondary | ICD-10-CM | POA: Diagnosis not present

## 2023-08-25 DIAGNOSIS — F411 Generalized anxiety disorder: Secondary | ICD-10-CM | POA: Diagnosis not present

## 2023-08-25 DIAGNOSIS — F17200 Nicotine dependence, unspecified, uncomplicated: Secondary | ICD-10-CM | POA: Diagnosis not present

## 2023-08-25 DIAGNOSIS — K219 Gastro-esophageal reflux disease without esophagitis: Secondary | ICD-10-CM | POA: Diagnosis not present

## 2023-08-25 DIAGNOSIS — F330 Major depressive disorder, recurrent, mild: Secondary | ICD-10-CM | POA: Diagnosis not present

## 2023-08-25 DIAGNOSIS — Z6824 Body mass index (BMI) 24.0-24.9, adult: Secondary | ICD-10-CM | POA: Diagnosis not present

## 2023-08-26 DIAGNOSIS — Z79891 Long term (current) use of opiate analgesic: Secondary | ICD-10-CM | POA: Diagnosis not present

## 2023-09-01 DIAGNOSIS — Z79899 Other long term (current) drug therapy: Secondary | ICD-10-CM | POA: Diagnosis not present

## 2023-09-01 DIAGNOSIS — F4381 Prolonged grief disorder: Secondary | ICD-10-CM | POA: Diagnosis not present

## 2023-09-01 DIAGNOSIS — F1120 Opioid dependence, uncomplicated: Secondary | ICD-10-CM | POA: Diagnosis not present

## 2023-09-01 DIAGNOSIS — F17200 Nicotine dependence, unspecified, uncomplicated: Secondary | ICD-10-CM | POA: Diagnosis not present

## 2023-09-05 DIAGNOSIS — F1120 Opioid dependence, uncomplicated: Secondary | ICD-10-CM | POA: Diagnosis not present

## 2023-09-15 DIAGNOSIS — F1120 Opioid dependence, uncomplicated: Secondary | ICD-10-CM | POA: Diagnosis not present

## 2023-09-15 DIAGNOSIS — Z79899 Other long term (current) drug therapy: Secondary | ICD-10-CM | POA: Diagnosis not present

## 2023-09-15 DIAGNOSIS — F17200 Nicotine dependence, unspecified, uncomplicated: Secondary | ICD-10-CM | POA: Diagnosis not present

## 2023-09-15 DIAGNOSIS — F4381 Prolonged grief disorder: Secondary | ICD-10-CM | POA: Diagnosis not present

## 2023-09-19 DIAGNOSIS — F1120 Opioid dependence, uncomplicated: Secondary | ICD-10-CM | POA: Diagnosis not present

## 2023-10-06 DIAGNOSIS — F17200 Nicotine dependence, unspecified, uncomplicated: Secondary | ICD-10-CM | POA: Diagnosis not present

## 2023-10-06 DIAGNOSIS — Z79899 Other long term (current) drug therapy: Secondary | ICD-10-CM | POA: Diagnosis not present

## 2023-10-06 DIAGNOSIS — F4381 Prolonged grief disorder: Secondary | ICD-10-CM | POA: Diagnosis not present

## 2023-10-06 DIAGNOSIS — F1120 Opioid dependence, uncomplicated: Secondary | ICD-10-CM | POA: Diagnosis not present

## 2023-10-06 DIAGNOSIS — Z79891 Long term (current) use of opiate analgesic: Secondary | ICD-10-CM | POA: Diagnosis not present

## 2023-10-11 DIAGNOSIS — J40 Bronchitis, not specified as acute or chronic: Secondary | ICD-10-CM | POA: Diagnosis not present

## 2023-10-11 DIAGNOSIS — Z6824 Body mass index (BMI) 24.0-24.9, adult: Secondary | ICD-10-CM | POA: Diagnosis not present

## 2023-10-11 DIAGNOSIS — F1120 Opioid dependence, uncomplicated: Secondary | ICD-10-CM | POA: Diagnosis not present

## 2023-11-03 DIAGNOSIS — F1120 Opioid dependence, uncomplicated: Secondary | ICD-10-CM | POA: Diagnosis not present

## 2023-11-23 DIAGNOSIS — F1120 Opioid dependence, uncomplicated: Secondary | ICD-10-CM | POA: Diagnosis not present

## 2023-12-02 DIAGNOSIS — F1120 Opioid dependence, uncomplicated: Secondary | ICD-10-CM | POA: Diagnosis not present

## 2023-12-02 DIAGNOSIS — Z79899 Other long term (current) drug therapy: Secondary | ICD-10-CM | POA: Diagnosis not present

## 2023-12-02 DIAGNOSIS — F4381 Prolonged grief disorder: Secondary | ICD-10-CM | POA: Diagnosis not present

## 2023-12-02 DIAGNOSIS — F17200 Nicotine dependence, unspecified, uncomplicated: Secondary | ICD-10-CM | POA: Diagnosis not present

## 2023-12-19 DIAGNOSIS — F1120 Opioid dependence, uncomplicated: Secondary | ICD-10-CM | POA: Diagnosis not present

## 2023-12-30 DIAGNOSIS — F1120 Opioid dependence, uncomplicated: Secondary | ICD-10-CM | POA: Diagnosis not present

## 2024-01-12 DIAGNOSIS — Z6825 Body mass index (BMI) 25.0-25.9, adult: Secondary | ICD-10-CM | POA: Diagnosis not present

## 2024-01-12 DIAGNOSIS — F411 Generalized anxiety disorder: Secondary | ICD-10-CM | POA: Diagnosis not present

## 2024-01-12 DIAGNOSIS — F330 Major depressive disorder, recurrent, mild: Secondary | ICD-10-CM | POA: Diagnosis not present

## 2024-01-12 DIAGNOSIS — K219 Gastro-esophageal reflux disease without esophagitis: Secondary | ICD-10-CM | POA: Diagnosis not present

## 2024-01-12 DIAGNOSIS — J4530 Mild persistent asthma, uncomplicated: Secondary | ICD-10-CM | POA: Diagnosis not present

## 2024-01-12 DIAGNOSIS — N182 Chronic kidney disease, stage 2 (mild): Secondary | ICD-10-CM | POA: Diagnosis not present

## 2024-01-12 DIAGNOSIS — R1310 Dysphagia, unspecified: Secondary | ICD-10-CM | POA: Diagnosis not present

## 2024-01-18 ENCOUNTER — Encounter (INDEPENDENT_AMBULATORY_CARE_PROVIDER_SITE_OTHER): Payer: Self-pay | Admitting: *Deleted

## 2024-02-17 ENCOUNTER — Ambulatory Visit (INDEPENDENT_AMBULATORY_CARE_PROVIDER_SITE_OTHER): Payer: 59 | Admitting: Gastroenterology

## 2024-03-13 ENCOUNTER — Ambulatory Visit (INDEPENDENT_AMBULATORY_CARE_PROVIDER_SITE_OTHER): Admitting: Gastroenterology

## 2024-03-14 ENCOUNTER — Encounter (INDEPENDENT_AMBULATORY_CARE_PROVIDER_SITE_OTHER): Payer: Self-pay | Admitting: *Deleted

## 2024-04-11 ENCOUNTER — Ambulatory Visit (INDEPENDENT_AMBULATORY_CARE_PROVIDER_SITE_OTHER): Admitting: Gastroenterology

## 2024-05-09 ENCOUNTER — Ambulatory Visit (INDEPENDENT_AMBULATORY_CARE_PROVIDER_SITE_OTHER): Admitting: Gastroenterology

## 2024-05-09 ENCOUNTER — Encounter (INDEPENDENT_AMBULATORY_CARE_PROVIDER_SITE_OTHER): Payer: Self-pay | Admitting: Gastroenterology

## 2024-05-09 ENCOUNTER — Telehealth: Payer: Self-pay | Admitting: *Deleted

## 2024-05-09 VITALS — BP 115/74 | HR 71 | Temp 98.1°F | Ht <= 58 in | Wt 122.0 lb

## 2024-05-09 DIAGNOSIS — Z800 Family history of malignant neoplasm of digestive organs: Secondary | ICD-10-CM | POA: Diagnosis not present

## 2024-05-09 DIAGNOSIS — K5900 Constipation, unspecified: Secondary | ICD-10-CM | POA: Diagnosis not present

## 2024-05-09 DIAGNOSIS — R1013 Epigastric pain: Secondary | ICD-10-CM | POA: Diagnosis not present

## 2024-05-09 DIAGNOSIS — K589 Irritable bowel syndrome without diarrhea: Secondary | ICD-10-CM | POA: Insufficient documentation

## 2024-05-09 DIAGNOSIS — R1319 Other dysphagia: Secondary | ICD-10-CM | POA: Insufficient documentation

## 2024-05-09 DIAGNOSIS — R1310 Dysphagia, unspecified: Secondary | ICD-10-CM | POA: Diagnosis not present

## 2024-05-09 DIAGNOSIS — Z1211 Encounter for screening for malignant neoplasm of colon: Secondary | ICD-10-CM | POA: Insufficient documentation

## 2024-05-09 MED ORDER — PSYLLIUM 58.6 % PO PACK: 1.0000 | PACK | Freq: Two times a day (BID) | ORAL | 2 refills | Status: AC

## 2024-05-09 MED ORDER — PANTOPRAZOLE SODIUM 40 MG PO TBEC
40.0000 mg | DELAYED_RELEASE_TABLET | Freq: Every day | ORAL | 3 refills | Status: DC
Start: 2024-05-09 — End: 2024-10-03

## 2024-05-09 NOTE — Telephone Encounter (Signed)
 Radmd PA for CT: Case Description: Abdomen and Pelvis CT  Request ID:Not Available Tracking:[id number] Request Date: 05/09/2024 12:26 PM Status: In Review

## 2024-05-09 NOTE — Progress Notes (Signed)
 [name] [name] , M.D. Gastroenterology & Hepatology [facility] Gastroenterology [address] [place], [place] [address] Primary Care Physician: Veda Gerald, MD [address] [place] [address]  Chief Complaint: abdominal pain, intermittent constipation, dysphagia and colon cancer screening  History of Present Illness: [name] is a 48 y.o. female with asthma who presents for evaluation of abdominal pain, intermittent constipation, dysphagia and colon cancer screening  Patient reports she has epigastric pain radiating to lower abdomen associated with nausea and most of the time this is after eating food.  Patient has Bristol stool scale bowel movement 3-4 every other day with occasional straining.  Patient reports occasional solid food dysphagia as food sliding down in middle of her chest. The patient denies having any nausea, vomiting, fever, chills, hematochezia, melena, hematemesis, abdominal distention,  diarrhea, jaundice, pruritus or weight loss.  Last UJW:JXBJ Last Colonoscopy:none  FHx: Mother pancreatic cancer- 61 Surgical: Cholecystectomy  Past Medical History: Past Medical History:  Diagnosis Date   Anxiety    Asthma    Depression    GERD (gastroesophageal reflux disease)    Stroke [facility])     Past Surgical History: Past Surgical History:  Procedure Laterality Date   BLADDER SURGERY     CHOLECYSTECTOMY     ESOPHAGOGASTRODUODENOSCOPY N/A 04/10/2014   Procedure: ESOPHAGOGASTRODUODENOSCOPY (EGD);  Surgeon: Ruby Corporal, MD;  [facility];  Service: Endoscopy;  Laterality: N/A;    Family History: Family History  Problem Relation Age of Onset   Cancer Mother    Pancreatic cancer Mother    Stroke Mother    Seizures Mother    Diabetes Father    Hypertension Father     Social History: Social History   Tobacco Use  Smoking Status Every Day   Current packs/day: 1.00   Average packs/day: 1 pack/day  for 13.0 years (13.0 ttl pk-yrs)   Types: Cigarettes  Smokeless Tobacco Never   Social History   Substance and Sexual Activity  Alcohol Use No   Social History   Substance and Sexual Activity  Drug Use Not Currently   Comment: no MJ since age 57 per pt but + UDS 2008    Allergies: No Known Allergies  Medications: Current Outpatient Medications  Medication Sig Dispense Refill   albuterol  (VENTOLIN  HFA) 108 (90 Base) MCG/ACT inhaler Inhale 2 puffs into the lungs every 6 (six) hours as needed for wheezing or shortness of breath. 3 each 0   cyclobenzaprine  (FLEXERIL ) 5 MG tablet Take 1 tablet (5 mg total) by mouth 2 (two) times daily as needed for muscle spasms. 30 tablet 0   DULoxetine  (CYMBALTA ) 30 MG capsule Take 1 capsule (30 mg total) by mouth daily. 90 capsule 0   pantoprazole  (PROTONIX ) 40 MG tablet Take 1 tablet (40 mg total) by mouth daily. 60 tablet 3   psyllium (METAMUCIL) 58.6 % packet Take 1 packet by mouth 2 (two) times daily. 60 packet 2   No current facility-administered medications for this visit.    Review of Systems: GENERAL: negative for malaise, night sweats HEENT: No changes in hearing or vision, no nose bleeds or other nasal problems. NECK: Negative for lumps, goiter, pain and significant neck swelling RESPIRATORY: Negative for cough, wheezing CARDIOVASCULAR: Negative for chest pain, leg swelling, palpitations, orthopnea GI: SEE HPI MUSCULOSKELETAL: Negative for joint pain or swelling, back pain, and muscle pain. SKIN: Negative for lesions, rash HEMATOLOGY Negative for prolonged bleeding, bruising easily, and swollen nodes.  ENDOCRINE: Negative for cold or heat intolerance, polyuria, polydipsia and goiter. NEURO: negative for tremor, gait imbalance, syncope and seizures. The remainder of the review of systems is noncontributory.   Physical Exam: BP 115/74   Pulse 71   Temp 98.1 F (36.7 C)   Ht 4\' 9"  (1.448 m)   Wt 122 lb (55.3 kg)   BMI 26.40  kg/m  GENERAL: The patient is AO x3, in no acute distress. HEENT: Head is normocephalic and atraumatic. EOMI are intact. Mouth is well hydrated and without lesions. NECK: Supple. No masses LUNGS: Clear to auscultation. No presence of rhonchi/wheezing/rales. Adequate chest expansion HEART: RRR, normal s1 and s2. ABDOMEN: Soft, epigastric tender, no guarding, no peritoneal signs, and nondistended. BS +. No masses.   Imaging/Labs: as above     Latest Ref Rng & Units 09/30/2021    1:01 PM 09/04/2021    2:02 PM 06/10/2015    4:57 PM  CBC  WBC 4.0 - 10.5 K/uL 7.4  9.9  9.9   Hemoglobin 12.0 - 15.0 g/dL 16.1  09.6  04.5   Hematocrit 36.0 - 46.0 % 39.2  36.1  35.7   Platelets 150 - 400 K/uL 403  373  345    Lab Results  Component Value Date   IRON 77 04/08/2014   TIBC 299 04/08/2014   FERRITIN 26 04/08/2014    I personally reviewed and interpreted the available labs, imaging and endoscopic files.  Impression and Plan:  [name] is a 48 y.o. female with asthma who presents for evaluation of abdominal pain, intermittent constipation, dysphagia and colon cancer screening  #Abdominal pain  #Family history of pancreatic cancer #Dysphagia  Patient has epigastric tenderness and postprandial abdominal pain this could be peptic ulcer disease, which I cannot rule out without upper endoscopy  Patient also had solid food dysphagia and with underlying atopy (asthma) , this could be eosinophilic esophagitis, web ring or stricture for which upper endoscopy with esophageal biopsies+/- dilation is indicated  Also given family history of pancreatic cancer and new upper GI symptoms.  Put patient on high risk and will obtain baseline cross-sectional imaging to evaluate pancreas  - Explained presumed etiology of IBS symptoms. Patient was counseled about the benefit of implementing a low FODMAP to improve symptoms and recurrent episodes. A dietary list was provided to the patient. Also, the  patient was counseled about the benefit of avoiding stressing situations and potential environmental triggers leading to symptomatology.  - Start IBGard 1 tablet every 8-12 hours as needed -Metamucil twice daily  Upper endoscopy with biopsies/dilation PPI daily  # Colon cancer screening The patient was counseled regarding the importance of colorectal cancer screening, particularly starting at age 46 due to the rising incidence of colorectal cancer in younger individuals. The benefits of screening include early detection of colorectal cancer and precancerous polyps, which can improve treatment outcomes and reduce mortality. Risks associated with screening, particularly colonoscopy, include potential complications such as bleeding and perforation. After deciding different modalities for screening for colon cancer , patient has opted to pursue Colonoscopy    All questions were answered.      Bowden Boody Faizan Rika Daughdrill, MD Gastroenterology and Hepatology [facility]   This chart has been completed using [facility] Dictation software, and while attempts have been made to ensure accuracy , certain words and phrases may not be transcribed as intended

## 2024-05-09 NOTE — H&P (View-Only) (Signed)
 [name] [name] , M.D. Gastroenterology & Hepatology [facility] Gastroenterology [address] [place], [place] [address] Primary Care Physician: Veda Gerald, MD [address] [place] [address]  Chief Complaint: abdominal pain, intermittent constipation, dysphagia and colon cancer screening  History of Present Illness: [name] is a 48 y.o. female with asthma who presents for evaluation of abdominal pain, intermittent constipation, dysphagia and colon cancer screening  Patient reports she has epigastric pain radiating to lower abdomen associated with nausea and most of the time this is after eating food.  Patient has Bristol stool scale bowel movement 3-4 every other day with occasional straining.  Patient reports occasional solid food dysphagia as food sliding down in middle of her chest. The patient denies having any nausea, vomiting, fever, chills, hematochezia, melena, hematemesis, abdominal distention,  diarrhea, jaundice, pruritus or weight loss.  Last UJW:JXBJ Last Colonoscopy:none  FHx: Mother pancreatic cancer- 61 Surgical: Cholecystectomy  Past Medical History: Past Medical History:  Diagnosis Date   Anxiety    Asthma    Depression    GERD (gastroesophageal reflux disease)    Stroke [facility])     Past Surgical History: Past Surgical History:  Procedure Laterality Date   BLADDER SURGERY     CHOLECYSTECTOMY     ESOPHAGOGASTRODUODENOSCOPY N/A 04/10/2014   Procedure: ESOPHAGOGASTRODUODENOSCOPY (EGD);  Surgeon: Ruby Corporal, MD;  Location[facility];  Service: Endoscopy;  Laterality: N/A;    Family History: Family History  Problem Relation Age of Onset   Cancer Mother    Pancreatic cancer Mother    Stroke Mother    Seizures Mother    Diabetes Father    Hypertension Father     Social History: Social History   Tobacco Use  Smoking Status Every Day   Current packs/day: 1.00   Average packs/day: 1 pack/day  for 13.0 years (13.0 ttl pk-yrs)   Types: Cigarettes  Smokeless Tobacco Never   Social History   Substance and Sexual Activity  Alcohol Use No   Social History   Substance and Sexual Activity  Drug Use Not Currently   Comment: no MJ since age 57 per pt but + UDS 2008    Allergies: No Known Allergies  Medications: Current Outpatient Medications  Medication Sig Dispense Refill   albuterol  (VENTOLIN  HFA) 108 (90 Base) MCG/ACT inhaler Inhale 2 puffs into the lungs every 6 (six) hours as needed for wheezing or shortness of breath. 3 each 0   cyclobenzaprine  (FLEXERIL ) 5 MG tablet Take 1 tablet (5 mg total) by mouth 2 (two) times daily as needed for muscle spasms. 30 tablet 0   DULoxetine  (CYMBALTA ) 30 MG capsule Take 1 capsule (30 mg total) by mouth daily. 90 capsule 0   pantoprazole  (PROTONIX ) 40 MG tablet Take 1 tablet (40 mg total) by mouth daily. 60 tablet 3   psyllium (METAMUCIL) 58.6 % packet Take 1 packet by mouth 2 (two) times daily. 60 packet 2   No current facility-administered medications for this visit.    Review of Systems: GENERAL: negative for malaise, night sweats HEENT: No changes in hearing or vision, no nose bleeds or other nasal problems. NECK: Negative for lumps, goiter, pain and significant neck swelling RESPIRATORY: Negative for cough, wheezing CARDIOVASCULAR: Negative for chest pain, leg swelling, palpitations, orthopnea GI: SEE HPI MUSCULOSKELETAL: Negative for joint pain or swelling, back pain, and muscle pain. SKIN: Negative for lesions, rash HEMATOLOGY Negative for prolonged bleeding, bruising easily, and swollen nodes.  ENDOCRINE: Negative for cold or heat intolerance, polyuria, polydipsia and goiter. NEURO: negative for tremor, gait imbalance, syncope and seizures. The remainder of the review of systems is noncontributory.   Physical Exam: BP 115/74   Pulse 71   Temp 98.1 F (36.7 C)   Ht 4\' 9"  (1.448 m)   Wt 122 lb (55.3 kg)   BMI 26.40  kg/m  GENERAL: The patient is AO x3, in no acute distress. HEENT: Head is normocephalic and atraumatic. EOMI are intact. Mouth is well hydrated and without lesions. NECK: Supple. No masses LUNGS: Clear to auscultation. No presence of rhonchi/wheezing/rales. Adequate chest expansion HEART: RRR, normal s1 and s2. ABDOMEN: Soft, epigastric tender, no guarding, no peritoneal signs, and nondistended. BS +. No masses.   Imaging/Labs: as above     Latest Ref Rng & Units 09/30/2021    1:01 PM 09/04/2021    2:02 PM 06/10/2015    4:57 PM  CBC  WBC 4.0 - 10.5 K/uL 7.4  9.9  9.9   Hemoglobin 12.0 - 15.0 g/dL 16.1  09.6  04.5   Hematocrit 36.0 - 46.0 % 39.2  36.1  35.7   Platelets 150 - 400 K/uL 403  373  345    Lab Results  Component Value Date   IRON 77 04/08/2014   TIBC 299 04/08/2014   FERRITIN 26 04/08/2014    I personally reviewed and interpreted the available labs, imaging and endoscopic files.  Impression and Plan:  [name] is a 48 y.o. female with asthma who presents for evaluation of abdominal pain, intermittent constipation, dysphagia and colon cancer screening  #Abdominal pain  #Family history of pancreatic cancer #Dysphagia  Patient has epigastric tenderness and postprandial abdominal pain this could be peptic ulcer disease, which I cannot rule out without upper endoscopy  Patient also had solid food dysphagia and with underlying atopy (asthma) , this could be eosinophilic esophagitis, web ring or stricture for which upper endoscopy with esophageal biopsies+/- dilation is indicated  Also given family history of pancreatic cancer and new upper GI symptoms.  Put patient on high risk and will obtain baseline cross-sectional imaging to evaluate pancreas  - Explained presumed etiology of IBS symptoms. Patient was counseled about the benefit of implementing a low FODMAP to improve symptoms and recurrent episodes. A dietary list was provided to the patient. Also, the  patient was counseled about the benefit of avoiding stressing situations and potential environmental triggers leading to symptomatology.  - Start IBGard 1 tablet every 8-12 hours as needed -Metamucil twice daily  Upper endoscopy with biopsies/dilation PPI daily  # Colon cancer screening The patient was counseled regarding the importance of colorectal cancer screening, particularly starting at age 46 due to the rising incidence of colorectal cancer in younger individuals. The benefits of screening include early detection of colorectal cancer and precancerous polyps, which can improve treatment outcomes and reduce mortality. Risks associated with screening, particularly colonoscopy, include potential complications such as bleeding and perforation. After deciding different modalities for screening for colon cancer , patient has opted to pursue Colonoscopy    All questions were answered.      Bowden Boody Faizan Rika Daughdrill, MD Gastroenterology and Hepatology [facility]   This chart has been completed using [facility] Dictation software, and while attempts have been made to ensure accuracy , certain words and phrases may not be transcribed as intended

## 2024-05-09 NOTE — Patient Instructions (Signed)
 It was very nice to meet you today, as dicussed with will plan for the following :  1) Low FODMAP diet  2) Ib guard 1-2 times daily  3) Metamucil twice daily   4) CT scan  5) Upper endoscopy and colonoscoy

## 2024-05-11 ENCOUNTER — Encounter: Payer: Self-pay | Admitting: *Deleted

## 2024-05-11 ENCOUNTER — Other Ambulatory Visit: Payer: Self-pay | Admitting: *Deleted

## 2024-05-11 MED ORDER — PEG 3350-KCL-NA BICARB-NACL 420 G PO SOLR: 4000.0000 mL | Freq: Once | ORAL | 0 refills | Status: AC

## 2024-05-21 ENCOUNTER — Telehealth: Payer: Self-pay | Admitting: *Deleted

## 2024-05-21 NOTE — Telephone Encounter (Signed)
 error

## 2024-05-21 NOTE — Telephone Encounter (Signed)
 Radmd PA for CT: Denied  On 05/09/2024, we asked your provider for the following important facts or documents: results of other tests you did first, like the recent results from basic imaging tests, like sound  wave pictures (ultrasound), X-rays (with or without dye), or scope tests. These results should  show why you still need this test.  Without this additional information, your request did not meet criteria for approval found in  [employer] Clinical Guideline 068 for Abdomen Pelvis CT.

## 2024-06-01 ENCOUNTER — Other Ambulatory Visit: Payer: Self-pay

## 2024-06-01 ENCOUNTER — Encounter (HOSPITAL_COMMUNITY)
Admission: RE | Admit: 2024-06-01 | Discharge: 2024-06-01 | Disposition: A | Discharge status: Home / Self Care | Source: Ambulatory Visit | Attending: Gastroenterology | Admitting: Gastroenterology

## 2024-06-01 ENCOUNTER — Encounter (HOSPITAL_COMMUNITY): Payer: Self-pay

## 2024-06-01 VITALS — Ht <= 58 in | Wt 122.0 lb

## 2024-06-01 DIAGNOSIS — Z01818 Encounter for other preprocedural examination: Secondary | ICD-10-CM

## 2024-06-01 NOTE — Pre-Procedure Instructions (Signed)
 Attempted pre-op phonecall. Left VM for her to call us back.

## 2024-06-07 ENCOUNTER — Encounter (HOSPITAL_COMMUNITY)
Admission: RE | Disposition: A | Discharge status: Home / Self Care | Payer: Self-pay | Source: Home / Self Care | Attending: Gastroenterology

## 2024-06-07 ENCOUNTER — Ambulatory Visit (HOSPITAL_COMMUNITY)
Admission: RE | Admit: 2024-06-07 | Discharge: 2024-06-07 | Disposition: A | Discharge status: Home / Self Care | Attending: Gastroenterology | Admitting: Gastroenterology

## 2024-06-07 ENCOUNTER — Ambulatory Visit (HOSPITAL_BASED_OUTPATIENT_CLINIC_OR_DEPARTMENT_OTHER): Payer: Self-pay | Admitting: Anesthesiology

## 2024-06-07 ENCOUNTER — Ambulatory Visit (HOSPITAL_COMMUNITY): Payer: Self-pay | Admitting: Anesthesiology

## 2024-06-07 ENCOUNTER — Other Ambulatory Visit: Payer: Self-pay

## 2024-06-07 ENCOUNTER — Other Ambulatory Visit: Payer: Self-pay | Admitting: *Deleted

## 2024-06-07 ENCOUNTER — Encounter (HOSPITAL_COMMUNITY): Payer: Self-pay | Admitting: Gastroenterology

## 2024-06-07 DIAGNOSIS — Z01818 Encounter for other preprocedural examination: Secondary | ICD-10-CM

## 2024-06-07 DIAGNOSIS — K648 Other hemorrhoids: Secondary | ICD-10-CM | POA: Diagnosis not present

## 2024-06-07 DIAGNOSIS — D122 Benign neoplasm of ascending colon: Secondary | ICD-10-CM | POA: Insufficient documentation

## 2024-06-07 DIAGNOSIS — Q438 Other specified congenital malformations of intestine: Secondary | ICD-10-CM | POA: Diagnosis not present

## 2024-06-07 DIAGNOSIS — K649 Unspecified hemorrhoids: Secondary | ICD-10-CM

## 2024-06-07 DIAGNOSIS — K319 Disease of stomach and duodenum, unspecified: Secondary | ICD-10-CM | POA: Diagnosis not present

## 2024-06-07 DIAGNOSIS — K3189 Other diseases of stomach and duodenum: Secondary | ICD-10-CM

## 2024-06-07 DIAGNOSIS — F419 Anxiety disorder, unspecified: Secondary | ICD-10-CM | POA: Insufficient documentation

## 2024-06-07 DIAGNOSIS — J45909 Unspecified asthma, uncomplicated: Secondary | ICD-10-CM | POA: Insufficient documentation

## 2024-06-07 DIAGNOSIS — K5909 Other constipation: Secondary | ICD-10-CM | POA: Insufficient documentation

## 2024-06-07 DIAGNOSIS — I10 Essential (primary) hypertension: Secondary | ICD-10-CM | POA: Diagnosis not present

## 2024-06-07 DIAGNOSIS — K2100 Gastro-esophageal reflux disease with esophagitis, without bleeding: Secondary | ICD-10-CM | POA: Diagnosis not present

## 2024-06-07 DIAGNOSIS — Z800 Family history of malignant neoplasm of digestive organs: Secondary | ICD-10-CM

## 2024-06-07 DIAGNOSIS — Z8249 Family history of ischemic heart disease and other diseases of the circulatory system: Secondary | ICD-10-CM | POA: Diagnosis not present

## 2024-06-07 DIAGNOSIS — R1013 Epigastric pain: Secondary | ICD-10-CM

## 2024-06-07 DIAGNOSIS — K635 Polyp of colon: Secondary | ICD-10-CM

## 2024-06-07 DIAGNOSIS — Q439 Congenital malformation of intestine, unspecified: Secondary | ICD-10-CM

## 2024-06-07 DIAGNOSIS — R1310 Dysphagia, unspecified: Secondary | ICD-10-CM | POA: Diagnosis present

## 2024-06-07 DIAGNOSIS — F17210 Nicotine dependence, cigarettes, uncomplicated: Secondary | ICD-10-CM | POA: Insufficient documentation

## 2024-06-07 DIAGNOSIS — K209 Esophagitis, unspecified without bleeding: Secondary | ICD-10-CM | POA: Diagnosis not present

## 2024-06-07 DIAGNOSIS — K2970 Gastritis, unspecified, without bleeding: Secondary | ICD-10-CM

## 2024-06-07 DIAGNOSIS — K222 Esophageal obstruction: Secondary | ICD-10-CM

## 2024-06-07 DIAGNOSIS — F32A Depression, unspecified: Secondary | ICD-10-CM | POA: Insufficient documentation

## 2024-06-07 DIAGNOSIS — Z8673 Personal history of transient ischemic attack (TIA), and cerebral infarction without residual deficits: Secondary | ICD-10-CM | POA: Insufficient documentation

## 2024-06-07 DIAGNOSIS — Z1211 Encounter for screening for malignant neoplasm of colon: Secondary | ICD-10-CM | POA: Diagnosis not present

## 2024-06-07 HISTORY — DX: Chronic obstructive pulmonary disease, unspecified: J44.9

## 2024-06-07 HISTORY — PX: ESOPHAGOGASTRODUODENOSCOPY: SHX5428

## 2024-06-07 HISTORY — PX: COLONOSCOPY: SHX5424

## 2024-06-07 LAB — HM COLONOSCOPY

## 2024-06-07 SURGERY — COLONOSCOPY
Anesthesia: General

## 2024-06-07 MED ORDER — LIDOCAINE 2% (20 MG/ML) 5 ML SYRINGE
INTRAMUSCULAR | Status: DC | PRN
  Administered 2024-06-07: 100 mg via INTRAVENOUS

## 2024-06-07 MED ORDER — PROPOFOL 10 MG/ML IV BOLUS
INTRAVENOUS | Status: DC | PRN
  Administered 2024-06-07 (×2): 50 mg via INTRAVENOUS

## 2024-06-07 MED ORDER — LACTATED RINGERS IV SOLN: INTRAVENOUS | Status: DC

## 2024-06-07 MED ORDER — POLYETHYLENE GLYCOL 3350 17 GM/SCOOP PO POWD: 8.5000 g | Freq: Every day | ORAL | Status: DC

## 2024-06-07 MED ORDER — PROPOFOL 500 MG/50ML IV EMUL
INTRAVENOUS | Status: AC
Start: 2024-06-07 — End: 2024-06-07
  Filled 2024-06-07: qty 50

## 2024-06-07 MED ORDER — PHENYLEPHRINE 80 MCG/ML (10ML) SYRINGE FOR IV PUSH (FOR BLOOD PRESSURE SUPPORT)
PREFILLED_SYRINGE | INTRAVENOUS | Status: DC | PRN
  Administered 2024-06-07: 80 ug via INTRAVENOUS

## 2024-06-07 MED ORDER — LIDOCAINE 2% (20 MG/ML) 5 ML SYRINGE
INTRAMUSCULAR | Status: AC
  Filled 2024-06-07: qty 5

## 2024-06-07 MED ORDER — LACTATED RINGERS IV SOLN: INTRAVENOUS | Status: DC | PRN

## 2024-06-07 MED ORDER — DICYCLOMINE HCL 20 MG PO TABS: 10.0000 mg | ORAL_TABLET | Freq: Three times a day (TID) | ORAL | 3 refills | Status: AC

## 2024-06-07 MED ORDER — PROPOFOL 500 MG/50ML IV EMUL
INTRAVENOUS | Status: DC | PRN
  Administered 2024-06-07: 150 ug/kg/min via INTRAVENOUS

## 2024-06-07 NOTE — Transfer of Care (Signed)
 Immediate Anesthesia Transfer of Care Note  Patient: [name]  Procedure(s) Performed: COLONOSCOPY EGD (ESOPHAGOGASTRODUODENOSCOPY)  Patient Location: Short Stay  Anesthesia Type:MAC  Level of Consciousness: awake and patient cooperative  Airway & Oxygen Therapy: Patient Spontanous Breathing  Post-op Assessment: Report given to RN and Post -op Vital signs reviewed and stable  Post vital signs: Reviewed and stable  Last Vitals:  Vitals Value Taken Time  BP 112/88 13:30 06/07/24  Temp    Pulse 70 13:30 06/07/24  Resp 15 13:30 06/07/24  SpO2 99% on RA 13:30 06/07/24    Last Pain:  Vitals:   06/07/24 1239  TempSrc:   PainSc: 0-No pain         Complications: No notable events documented.

## 2024-06-07 NOTE — Interval H&P Note (Signed)
 History and Physical Interval Note:  06/07/2024 11:39 AM  [name] Staff  has presented today for surgery, with the diagnosis of screening,dysphagia,abd pain.  The various methods of treatment have been discussed with the patient and family. After consideration of risks, benefits and other options for treatment, the patient has consented to  Procedure(s) with comments: COLONOSCOPY (N/A) - 1:15 pm, asa 1/2 EGD (ESOPHAGOGASTRODUODENOSCOPY) (N/A) as a surgical intervention.  The patient's history has been reviewed, patient examined, no change in status, stable for surgery.  I have reviewed the patient's chart and labs.  Questions were answered to the patient's satisfaction.     [name] [name]

## 2024-06-07 NOTE — Anesthesia Preprocedure Evaluation (Signed)
 Anesthesia Evaluation  Patient identified by MRN, date of birth, ID band Patient awake    Reviewed: Allergy & Precautions, H&P , NPO status , Patient's Chart, lab work & pertinent test results, reviewed documented beta blocker date and time   Airway Mallampati: II  TM Distance: >3 FB Neck ROM: full    Dental no notable dental hx.    Pulmonary asthma , Current Smoker   Pulmonary exam normal breath sounds clear to auscultation       Cardiovascular Exercise Tolerance: Good hypertension, negative cardio ROS  Rhythm:regular Rate:Normal     Neuro/Psych  PSYCHIATRIC DISORDERS Anxiety Depression    CVA    GI/Hepatic Neg liver ROS,GERD  ,,  Endo/Other  negative endocrine ROS    Renal/GU negative Renal ROS  negative genitourinary   Musculoskeletal   Abdominal   Peds  Hematology  (+) Blood dyscrasia, anemia   Anesthesia Other Findings   Reproductive/Obstetrics negative OB ROS                             Anesthesia Physical Anesthesia Plan  ASA: 3  Anesthesia Plan: General   Post-op Pain Management:    Induction:   PONV Risk Score and Plan: Propofol infusion  Airway Management Planned:   Additional Equipment:   Intra-op Plan:   Post-operative Plan:   Informed Consent: I have reviewed the patients History and Physical, chart, labs and discussed the procedure including the risks, benefits and alternatives for the proposed anesthesia with the patient or authorized representative who has indicated his/her understanding and acceptance.     Dental Advisory Given  Plan Discussed with: CRNA  Anesthesia Plan Comments:        Anesthesia Quick Evaluation

## 2024-06-07 NOTE — Discharge Instructions (Signed)

## 2024-06-07 NOTE — Telephone Encounter (Signed)
 Per Dr. Cinderella order ABD COMPLETE US   LMOVM for pt to call back to give appt details 6/30, arrival 10:15am, npo midnight prior

## 2024-06-07 NOTE — Op Note (Signed)
 [facility] Patient Name: [name] Procedure Date: 06/07/2024 11:34 AM MRN: [id number] Date of Birth: March 18, 1976 Attending MD: Deatrice Dine , MD, [id number] CSN: [id number] Age: 48 Admit Type: Outpatient Procedure:                Upper GI endoscopy Indications:              Epigastric abdominal pain, Dysphagia Providers:                Deatrice Dine, MD, Madelin Hunter, RN, Mickel CROME.                            Shirlean Balm, Technician Referring MD:              Medicines:                Monitored Anesthesia Care Complications:            No immediate complications. Estimated Blood Loss:     Estimated blood loss was minimal. Procedure:                Pre-Anesthesia Assessment:                           - Prior to the procedure, a History and Physical                            was performed, and patient medications and                            allergies were reviewed. The patient's tolerance of                            previous anesthesia was also reviewed. The risks                            and benefits of the procedure and the sedation                            options and risks were discussed with the patient.                            All questions were answered, and informed consent                            was obtained. Prior Anticoagulants: The patient has                            taken no anticoagulant or antiplatelet agents. ASA                            Grade Assessment: II - A patient with mild systemic                            disease. After reviewing the risks and benefits,  the patient was deemed in satisfactory condition to                            undergo the procedure.                           After obtaining informed consent, the endoscope was                            passed under direct vision. Throughout the                            procedure, the patient's blood pressure, pulse, and                             oxygen saturations were monitored continuously. The                            GIF-H190 ([id number]) scope was introduced through the                            mouth, and advanced to the second part of duodenum.                            The upper GI endoscopy was accomplished without                            difficulty. The patient tolerated the procedure                            well. Scope In: 12:44:00 PM Scope Out: 12:52:10 PM Total Procedure Duration: 0 hours 8 minutes 10 seconds  Findings:      One benign-appearing, intrinsic moderate stenosis was found at the       gastroesophageal junction. The stenosis was traversed. A TTS dilator was       passed through the scope. Dilation with a 15-16.5-18 mm balloon dilator       was performed to 18 mm. The dilation site was examined following       endoscope reinsertion and showed moderate mucosal disruption, complete       resolution of luminal narrowing and no perforation. This was biopsied       with a cold forceps for histology.      Mildly erythematous mucosa without bleeding was found in the gastric       antrum. This was biopsied with a cold forceps for histology.      The duodenal bulb and second portion of the duodenum were normal. Impression:               - Benign-appearing esophageal stenosis. Dilated.                            Biopsied.                           - Erythematous mucosa in the antrum. Biopsied.                           -  Normal duodenal bulb and second portion of the                            duodenum. Moderate Sedation:      Per Anesthesia Care Recommendation:           - Patient has a contact number available for                            emergencies. The signs and symptoms of potential                            delayed complications were discussed with the                            patient. Return to normal activities tomorrow.                            Written discharge instructions were  provided to the                            patient.                           - Resume previous diet.                           - Continue present medications.                           - Await pathology results.                           - Repeat upper endoscopy PRN. Procedure Code(s):        --- Professional ---                           [phone], 59, Esophagogastroduodenoscopy, flexible,                            transoral; with transendoscopic balloon dilation of                            esophagus (less than 30 mm diameter)                           [id number], 59, Esophagogastroduodenoscopy, flexible,                            transoral; with biopsy, single or multiple Diagnosis Code(s):        --- Professional ---                           K22.2, Esophageal obstruction                           K31.89, Other diseases of stomach and duodenum  R10.13, Epigastric pain                           R13.10, Dysphagia, unspecified CPT copyright 2022 American Medical Association. All rights reserved. The codes documented in this report are preliminary and upon coder review may  be revised to meet current compliance requirements. Deatrice Dine, MD Deatrice Dine, MD 06/07/2024 12:56:03 PM This report has been signed electronically. Number of Addenda: 0

## 2024-06-07 NOTE — OR Nursing (Signed)
 Patient is on her period now. States she is not pregnant and unable to give urine sample due to the fact that she voided prior to her procedure. Stated I am not pregnant and wants to proceed with her procedure.

## 2024-06-07 NOTE — Op Note (Signed)
 [facility] Patient Name: [name] Procedure Date: 06/07/2024 12:54 PM MRN: [id number] Date of Birth: Jan 19, 1976 Attending MD: Deatrice Dine , MD, [id number] CSN: [id number] Age: 48 Admit Type: Outpatient Procedure:                Colonoscopy Indications:              Screening for colorectal malignant neoplasm Providers:                Deatrice Dine, MD, Madelin Hunter, RN, Mickel CROME.                            Shirlean Balm, Technician Referring MD:              Medicines:                Monitored Anesthesia Care Complications:            No immediate complications. Estimated Blood Loss:     Estimated blood loss: none. Procedure:                Pre-Anesthesia Assessment:                           - Prior to the procedure, a History and Physical                            was performed, and patient medications and                            allergies were reviewed. The patient's tolerance of                            previous anesthesia was also reviewed. The risks                            and benefits of the procedure and the sedation                            options and risks were discussed with the patient.                            All questions were answered, and informed consent                            was obtained. Prior Anticoagulants: The patient has                            taken no anticoagulant or antiplatelet agents. ASA                            Grade Assessment: II - A patient with mild systemic                            disease. After reviewing the risks and benefits,  the patient was deemed in satisfactory condition to                            undergo the procedure.                           - Prior to the procedure, a History and Physical                            was performed, and patient medications and                            allergies were reviewed. The patient's tolerance of                             previous anesthesia was also reviewed. The risks                            and benefits of the procedure and the sedation                            options and risks were discussed with the patient.                            All questions were answered, and informed consent                            was obtained. Prior Anticoagulants: The patient has                            taken no anticoagulant or antiplatelet agents. ASA                            Grade Assessment: II - A patient with mild systemic                            disease. After reviewing the risks and benefits,                            the patient was deemed in satisfactory condition to                            undergo the procedure.                           After obtaining informed consent, the colonoscope                            was passed under direct vision. Throughout the                            procedure, the patient's blood pressure, pulse, and  oxygen saturations were monitored continuously. The                            [id number] scope was introduced through the                            anus and advanced to the the cecum, identified by                            appendiceal orifice and ileocecal valve. The                            colonoscopy was performed without difficulty. The                            patient tolerated the procedure well. The quality                            of the bowel preparation was evaluated using the                            BBPS [facility] Bowel Preparation Scale) with scores                            of: Right Colon = 3, Transverse Colon = 3 and Left                            Colon = 3 (entire mucosa seen well with no residual                            staining, small fragments of stool or opaque                            liquid). The total BBPS score equals 9. The                            ileocecal valve, appendiceal  orifice, and rectum                            were photographed. Scope In: 1:01:17 PM Scope Out: 1:24:24 PM Scope Withdrawal Time: 0 hours 14 minutes 28 seconds  Total Procedure Duration: 0 hours 23 minutes 7 seconds  Findings:      The perianal and digital rectal examinations were normal.      A 5 mm polyp was found in the ascending colon. The polyp was sessile.       The polyp was removed with a cold snare. Resection and retrieval were       complete.      The colon (entire examined portion) was tortuous.      Non-bleeding internal hemorrhoids were found during endoscopy. The       hemorrhoids were small. Impression:               - One 5 mm polyp in the ascending colon, removed  with a cold snare. Resected and retrieved.                           - Tortuous colon.                           - Non-bleeding internal hemorrhoids. Moderate Sedation:      Per Anesthesia Care Recommendation:           - Patient has a contact number available for                            emergencies. The signs and symptoms of potential                            delayed complications were discussed with the                            patient. Return to normal activities tomorrow.                            Written discharge instructions were provided to the                            patient.                           - Resume previous diet.                           - Continue present medications.                           - Await pathology results.                           - Repeat colonoscopy in 7-10 years for surveillance                            based on pathology results.                           - Return to GI office as previously scheduled. Procedure Code(s):        --- Professional ---                           [phone], Colonoscopy, flexible; with removal of                            tumor(s), polyp(s), or other lesion(s) by snare                             technique Diagnosis Code(s):        --- Professional ---                           Z12.11, Encounter for screening for malignant  neoplasm of colon                           D12.2, Benign neoplasm of ascending colon                           K64.8, Other hemorrhoids                           Q43.8, Other specified congenital malformations of                            intestine CPT copyright 2022 American Medical Association. All rights reserved. The codes documented in this report are preliminary and upon coder review may  be revised to meet current compliance requirements. Deatrice Dine, MD Deatrice Dine, MD 06/07/2024 1:33:44 PM This report has been signed electronically. Number of Addenda: 0

## 2024-06-08 ENCOUNTER — Encounter (HOSPITAL_COMMUNITY): Payer: Self-pay | Admitting: Gastroenterology

## 2024-06-08 LAB — SURGICAL PATHOLOGY

## 2024-06-08 NOTE — Anesthesia Postprocedure Evaluation (Signed)
 Anesthesia Post Note  Patient: [name]  Procedure(s) Performed: COLONOSCOPY EGD (ESOPHAGOGASTRODUODENOSCOPY)  Patient location during evaluation: Phase II Anesthesia Type: General Level of consciousness: awake Pain management: pain level controlled Vital Signs Assessment: post-procedure vital signs reviewed and stable Respiratory status: spontaneous breathing and respiratory function stable Cardiovascular status: blood pressure returned to baseline and stable Postop Assessment: no headache and no apparent nausea or vomiting Anesthetic complications: no Comments: Late entry   No notable events documented.   Last Vitals:  Vitals:   06/07/24 1133 06/07/24 1328  BP: 116/73 112/68  Pulse: 67 66  Resp: 11 16  Temp: 37 C 36.8 C  SpO2: 99% 100%    Last Pain:  Vitals:   06/07/24 1328  TempSrc: Axillary  PainSc: 0-No pain                 [name]

## 2024-06-08 NOTE — Telephone Encounter (Signed)
 LMOVM to call back

## 2024-06-11 ENCOUNTER — Ambulatory Visit (INDEPENDENT_AMBULATORY_CARE_PROVIDER_SITE_OTHER): Payer: Self-pay | Admitting: Gastroenterology

## 2024-06-11 ENCOUNTER — Ambulatory Visit (HOSPITAL_COMMUNITY)
Admission: RE | Admit: 2024-06-11 | Discharge: 2024-06-11 | Disposition: A | Discharge status: Home / Self Care | Source: Ambulatory Visit | Attending: Gastroenterology | Admitting: Gastroenterology

## 2024-06-11 DIAGNOSIS — Z800 Family history of malignant neoplasm of digestive organs: Secondary | ICD-10-CM | POA: Diagnosis present

## 2024-06-11 DIAGNOSIS — R1013 Epigastric pain: Secondary | ICD-10-CM | POA: Insufficient documentation

## 2024-06-11 NOTE — Progress Notes (Signed)
 Hi Tanya ,  Can you please call the patient and tell the patient the ultrasound did not show anything significant , except that the bile duct is dilated .  I would advise to see us  in clinic and I may obtain MRI   Thanks,  Treina Arscott Faizan Suhaas Agena, MD Gastroenterology and Hepatology [facility] Gastroenterology  ===============  Ultrasound    IMPRESSION: 1. No sonographic etiology for abdominal pain is identified. 2. Common bile duct dilatation is similar in comparison to prior CT and likely reflects reservoir effect status post cholecystectomy.    CBD: 18mm. Abdominal pain and family history of pancreatic ca, will obtain MRI Abdomen and MRCP

## 2024-06-12 ENCOUNTER — Encounter (INDEPENDENT_AMBULATORY_CARE_PROVIDER_SITE_OTHER): Payer: Self-pay | Admitting: *Deleted

## 2024-06-12 NOTE — Progress Notes (Signed)
 7 yr TCS noted in recall Patient result letter mailed procedure note and pathology result faxed to PCP

## 2024-06-25 ENCOUNTER — Ambulatory Visit (INDEPENDENT_AMBULATORY_CARE_PROVIDER_SITE_OTHER): Admitting: Gastroenterology

## 2024-06-26 ENCOUNTER — Ambulatory Visit (INDEPENDENT_AMBULATORY_CARE_PROVIDER_SITE_OTHER): Admitting: Gastroenterology

## 2024-07-17 ENCOUNTER — Ambulatory Visit (INDEPENDENT_AMBULATORY_CARE_PROVIDER_SITE_OTHER): Admitting: Gastroenterology

## 2024-07-17 ENCOUNTER — Encounter (INDEPENDENT_AMBULATORY_CARE_PROVIDER_SITE_OTHER): Payer: Self-pay | Admitting: Gastroenterology

## 2024-07-17 ENCOUNTER — Telehealth: Payer: Self-pay | Admitting: *Deleted

## 2024-07-17 VITALS — BP 116/71 | HR 60 | Temp 98.0°F | Ht <= 58 in | Wt 119.0 lb

## 2024-07-17 DIAGNOSIS — Z800 Family history of malignant neoplasm of digestive organs: Secondary | ICD-10-CM | POA: Diagnosis not present

## 2024-07-17 DIAGNOSIS — K838 Other specified diseases of biliary tract: Secondary | ICD-10-CM | POA: Diagnosis not present

## 2024-07-17 DIAGNOSIS — K581 Irritable bowel syndrome with constipation: Secondary | ICD-10-CM

## 2024-07-17 DIAGNOSIS — K5900 Constipation, unspecified: Secondary | ICD-10-CM | POA: Diagnosis not present

## 2024-07-17 DIAGNOSIS — R1319 Other dysphagia: Secondary | ICD-10-CM

## 2024-07-17 DIAGNOSIS — Z860100 Personal history of colon polyps, unspecified: Secondary | ICD-10-CM

## 2024-07-17 DIAGNOSIS — R1310 Dysphagia, unspecified: Secondary | ICD-10-CM | POA: Diagnosis not present

## 2024-07-17 MED ORDER — LINACLOTIDE 145 MCG PO CAPS: 145.0000 ug | ORAL_CAPSULE | Freq: Every day | ORAL | 1 refills | Status: DC

## 2024-07-17 NOTE — Progress Notes (Signed)
 Arsalan Brisbin Faizan Emmalise Huard , M.D. Gastroenterology & Hepatology [facility] Gastroenterology [address] [place], [place] [address] Primary Care Physician: Orpha Yancey LABOR, MD [address] [place] [address]  Chief Complaint: abdominal pain, intermittent constipation, dysphagia   History of Present Illness: [name] is a 48 y.o. female with asthma who presents for evaluation of abdominal pain, intermittent constipation, dysphagia   Patient recently underwent upper endoscopy with dilation and colonoscopy with removal of polyp.  Patient reports after last upper endoscopy with dilation dysphagia has significantly improved.  Patient is awaiting to get dental work done. Patient reports she has epigastric pain radiating to lower abdomen associated with nausea and most of the time this is after eating food.  Patient has Bristol stool scale bowel movement 3-4 every other day with occasional straining.  The patient denies having any nausea, vomiting, fever, chills, hematochezia, melena, hematemesis, abdominal distention,  diarrhea, jaundice, pruritus or weight loss.  Last EGD:05/2024  - Benign- appearing esophageal stenosis. Dilated. Biopsied. - Erythematous mucosa in the antrum. Biopsied. Outpatient - Normal duodenal bulb and second portion of the duodenum.  Last Colonoscopy:05/2024  - One 5 mm polyp in the ascending colon, removed with a cold snare. Resected and retrieved. - Tortuous colon. - Non- bleeding internal hemorrhoids.  A. STOMACH BIOPSY:  - Mild reactive gastropathy.  - Negative for H. pylori on HE stain  - No intestinal metaplasia, dysplasia, or malignancy.   B. ESOPHAGEAL BIOPSY:  - Benign squamous mucosa with mild chronic inflammation  - Negative for increased intraepithelial eosinophils  - Negative for dysplasia or malignancy   C. COLON ASCENDING POLYPECTOMY:  - Tubular adenoma.  - No high grade dysplasia or malignancy.   FHx: Mother  pancreatic cancer- 61 Surgical: Cholecystectomy  Past Medical History: Past Medical History:  Diagnosis Date   Anxiety    Asthma    COPD (chronic obstructive pulmonary disease) (HCC)    Depression    GERD (gastroesophageal reflux disease)    Stroke (HCC)    left side weakness    Past Surgical History: Past Surgical History:  Procedure Laterality Date   BLADDER SURGERY     CHOLECYSTECTOMY     COLONOSCOPY N/A 06/07/2024   Procedure: COLONOSCOPY;  Surgeon: Cinderella Deatrice FALCON, MD;  [facility];  Service: Endoscopy;  Laterality: N/A;  1:15 pm, asa 1/2   ESOPHAGOGASTRODUODENOSCOPY N/A 04/10/2014   Procedure: ESOPHAGOGASTRODUODENOSCOPY (EGD);  Surgeon: Claudis RAYMOND Rivet, MD;  Location: [facility];  Service: Endoscopy;  Laterality: N/A;   ESOPHAGOGASTRODUODENOSCOPY N/A 06/07/2024   Procedure: EGD (ESOPHAGOGASTRODUODENOSCOPY);  Surgeon: Cinderella Deatrice FALCON, MD;  Location: [facility];  Service: Endoscopy;  Laterality: N/A;    Family History: Family History  Problem Relation Age of Onset   Cancer Mother    Pancreatic cancer Mother    Stroke Mother    Seizures Mother    Diabetes Father    Hypertension Father     Social History: Social History   Tobacco Use  Smoking Status Every Day   Current packs/day: 1.00   Average packs/day: 1 pack/day for 13.0 years (13.0 ttl pk-yrs)   Types: Cigarettes  Smokeless Tobacco Never   Social History   Substance and Sexual Activity  Alcohol Use No   Social History   Substance and Sexual Activity  Drug Use Not Currently   Comment: no MJ since age 55 per pt but + UDS 2008    Allergies: No Known Allergies  Medications:  Current Outpatient Medications  Medication Sig Dispense Refill   albuterol  (VENTOLIN  HFA) 108 (90 Base) MCG/ACT inhaler Inhale 2 puffs into the lungs every 6 (six) hours as needed for wheezing or shortness of breath. 3 each 0   ALPRAZolam  (XANAX ) 0.5 MG tablet Take 0.5 mg by mouth 2 (two) times daily.      cyclobenzaprine  (FLEXERIL ) 5 MG tablet Take 1 tablet (5 mg total) by mouth 2 (two) times daily as needed for muscle spasms. 30 tablet 0   dicyclomine  (BENTYL ) 20 MG tablet Take 0.5 tablets (10 mg total) by mouth 3 (three) times daily before meals. 45 tablet 3   DULoxetine  (CYMBALTA ) 30 MG capsule Take 1 capsule (30 mg total) by mouth daily. 90 capsule 0   pantoprazole  (PROTONIX ) 40 MG tablet Take 1 tablet (40 mg total) by mouth daily. 60 tablet 3   polyethylene glycol powder (GLYCOLAX /MIRALAX ) 17 GM/SCOOP powder Take 8.5 g by mouth daily.     psyllium (METAMUCIL) 58.6 % packet Take 1 packet by mouth 2 (two) times daily. 60 packet 2   No current facility-administered medications for this visit.    Review of Systems: GENERAL: negative for malaise, night sweats HEENT: No changes in hearing or vision, no nose bleeds or other nasal problems. NECK: Negative for lumps, goiter, pain and significant neck swelling RESPIRATORY: Negative for cough, wheezing CARDIOVASCULAR: Negative for chest pain, leg swelling, palpitations, orthopnea GI: SEE HPI MUSCULOSKELETAL: Negative for joint pain or swelling, back pain, and muscle pain. SKIN: Negative for lesions, rash HEMATOLOGY Negative for prolonged bleeding, bruising easily, and swollen nodes. ENDOCRINE: Negative for cold or heat intolerance, polyuria, polydipsia and goiter. NEURO: negative for tremor, gait imbalance, syncope and seizures. The remainder of the review of systems is noncontributory.   Physical Exam: BP 116/71   Pulse 60   Temp 98 F (36.7 C)   Ht 4' 9 (1.448 m)   Wt 119 lb (54 kg)   BMI 25.75 kg/m  GENERAL: The patient is AO x3, in no acute distress. HEENT: Head is normocephalic and atraumatic. EOMI are intact. Mouth is well hydrated and without lesions. NECK: Supple. No masses LUNGS: Clear to auscultation. No presence of rhonchi/wheezing/rales. Adequate chest expansion HEART: RRR, normal s1 and s2. ABDOMEN: Soft, epigastric  tender, no guarding, no peritoneal signs, and nondistended. BS +. No masses.   Imaging/Labs: as above     Latest Ref Rng & Units 09/30/2021    1:01 PM 09/04/2021    2:02 PM 06/10/2015    4:57 PM  CBC  WBC 4.0 - 10.5 K/uL 7.4  9.9  9.9   Hemoglobin 12.0 - 15.0 g/dL 88.1  88.7  88.8   Hematocrit 36.0 - 46.0 % 39.2  36.1  35.7   Platelets 150 - 400 K/uL 403  373  345    Lab Results  Component Value Date   IRON 77 04/08/2014   TIBC 299 04/08/2014   FERRITIN 26 04/08/2014    I personally reviewed and interpreted the available labs, imaging and endoscopic files.   IMPRESSION: 1. No sonographic etiology for abdominal pain is identified. 2. Common bile duct dilatation is similar in comparison to prior CT and likely reflects reservoir effect status post cholecystectomy.   Impression and Plan:  FAYDRA KORMAN is a 48 y.o. female with asthma who presents for evaluation of abdominal pain, intermittent constipation, dysphagia   #CBD dilation #Abdominal pain  #Family history of pancreatic cancer  CBD dilated at 18mm which could be postcholecystectomy  reservoir effect.  But with continued abdominal pain and family history of pancreatic cancer in mother,  will obtain MRI Abdomen and MRCP   Abdominal pain with constipation could be IBS-C  - IBGard 1 tablet every 8-12 hours as needed -Metamucil twice daily  Since patient continues to be constipated we will start Linzess  72 mcg and uptitrate as needed  #Dysphagia  Significant improvement in dysphagia after dilation, intermittent dysphagia currently because patient dentation is not adequate.  Will follow-up with dentist  # Colon polyp.  Anoscopy in 7 years  All questions were answered.      Marybella Ethier Faizan Avyukt Cimo, MD Gastroenterology and Hepatology [facility] Gastroenterology   This chart has been completed using [facility] Dictation software, and while attempts have been made to ensure accuracy , certain  words and phrases may not be transcribed as intended

## 2024-07-17 NOTE — Telephone Encounter (Signed)
 RadMD PA for MRI: Request ID:Not Available. Tracking: [id number]  Request Date: 07/17/2024 09:42 AM Status: In Review

## 2024-07-17 NOTE — Patient Instructions (Signed)
 It was very nice to meet you today, as dicussed with will plan for the following :  1) IG Guard 1-2 time daily  2) Ensure adequate fluid intake: Aim for 8 glasses of water  daily. Follow a high fiber diet: Include foods such as dates, prunes, pears, and kiwi. Use miralax  daily    3) Start Linzess  and can stop miralax  at that time  4) Cancel CT abdomen and obtain MRI /MRCP abdomen

## 2024-07-23 ENCOUNTER — Telehealth (INDEPENDENT_AMBULATORY_CARE_PROVIDER_SITE_OTHER): Payer: Self-pay | Admitting: Gastroenterology

## 2024-07-23 NOTE — Telephone Encounter (Signed)
 Pt left voicemail that she has a question about medication that was sent in.  Returned call to pt. Pt states that Linzess  that was given when she was in office was working for her. Advised pt that a script had been sent to pharmacy. Pt verbalized understanding.

## 2024-07-26 NOTE — Telephone Encounter (Signed)
 LMOVM to return call  MRI scheduled for Tuesday 07/31/24, arrive at 11:30 am to check in, NPO 4 hours prior.

## 2024-07-26 NOTE — Telephone Encounter (Signed)
 Pt informed of MRI appt date, time, location and instructions.

## 2024-07-26 NOTE — Telephone Encounter (Signed)
 RadMD PA:   Request PI:[id number] Tracking:[id number] Status: Approved Entry Method: RadMD Validity Dates: 07/17/2024-08/16/2024

## 2024-07-31 ENCOUNTER — Ambulatory Visit (HOSPITAL_COMMUNITY)
Admission: RE | Admit: 2024-07-31 | Discharge: 2024-07-31 | Disposition: A | Discharge status: Home / Self Care | Source: Ambulatory Visit | Attending: Gastroenterology | Admitting: Gastroenterology

## 2024-07-31 ENCOUNTER — Other Ambulatory Visit (INDEPENDENT_AMBULATORY_CARE_PROVIDER_SITE_OTHER): Payer: Self-pay | Admitting: Gastroenterology

## 2024-07-31 DIAGNOSIS — K838 Other specified diseases of biliary tract: Secondary | ICD-10-CM | POA: Diagnosis present

## 2024-07-31 DIAGNOSIS — Z800 Family history of malignant neoplasm of digestive organs: Secondary | ICD-10-CM

## 2024-07-31 MED ORDER — GADOBUTROL 1 MMOL/ML IV SOLN
5.5000 mL | Freq: Once | INTRAVENOUS | Status: AC | PRN
  Administered 2024-07-31: 5.5 mL via INTRAVENOUS

## 2024-08-02 ENCOUNTER — Ambulatory Visit (INDEPENDENT_AMBULATORY_CARE_PROVIDER_SITE_OTHER): Payer: Self-pay | Admitting: Gastroenterology

## 2024-08-02 NOTE — Progress Notes (Signed)
 I spoke to the patient about concerning findings on MRI and she is agreeable for referral for EUS/ERCP  ==============================  Hi [name] ,  Can you please arrange a referral for this patient to Dr Wilhelmenia for EUS/ERCP (urgent)  Diagnosis: family history of pancreatic cancer , CBD dilation , r/o ampullary mass , abnormal MRI   Also please send the copy of report to the PCP    Thanks,  Destinie Thornsberry Faizan Gazelle Towe, MD Gastroenterology and Hepatology [facility] Gastroenterology  =============================  MRI IMPRESSION: Biliary ductal dilatation, with abrupt termination at the ampulla, and possible ampullary mass/carcinoma. No evidence of choledocholithiasis. ERCP/EUS is recommended for further evaluation.   No evidence of pancreatic mass or pancreatic ductal dilatation.

## 2024-08-02 NOTE — Progress Notes (Signed)
 Message sent to [facility] for EUS/ERCP report faxed to PCP

## 2024-08-06 ENCOUNTER — Telehealth (INDEPENDENT_AMBULATORY_CARE_PROVIDER_SITE_OTHER): Payer: Self-pay | Admitting: *Deleted

## 2024-08-06 NOTE — Telephone Encounter (Signed)
 Referral sent, they will contact patient with apt

## 2024-08-06 NOTE — Telephone Encounter (Signed)
-----   Message from [name] sent at 08/06/2024  9:41 AM EDT ----- Hi Dr Wilhelmenia  Thank you for your reply even when you are off  [name] please refer the patient elsewhere as discussed   Thank you ----- Message ----- From: Wilhelmenia Aloha Raddle., MD Sent: 08/03/2024  10:15 PM EDT To: Jenkins VEAR [place]; Almarie DELENA Goo, LPN; [place]#  Team, I am away this week. Right now, I am scheduled out until middle of October. I think this patient needs to be referred elsewhere. You can see by middle of next week, if an individual falls off, but I do not think I can accommodate right now Recommend you get updated LFTs and a CA19-9. If by Tuesday, I don't have an opening for Upper EUS within 4-weeks, I recommend for sure this patient be sent out. Sorry all. GM ----- Message ----- From: Goo Almarie DELENA, LPN Sent: 1/77/7974   1:10 PM EDT To: Aloha Wilhelmenia Raddle., MD  Please advise ----- Message ----- From: [place] Jenkins VEAR Sent: 08/02/2024  10:10 AM EDT To: Odetta LITTIE Curly, RN  Hey patty, per Dr Miles patient needs EUS/ERCP (urgent) with Dr Wilhelmenia   Diagnosis: family history of pancreatic cancer , CBD dilation , r/o ampullary mass , abnormal MRI   Thanks, Jenkins

## 2024-08-16 ENCOUNTER — Other Ambulatory Visit (INDEPENDENT_AMBULATORY_CARE_PROVIDER_SITE_OTHER): Payer: Self-pay | Admitting: Gastroenterology

## 2024-08-16 ENCOUNTER — Telehealth (INDEPENDENT_AMBULATORY_CARE_PROVIDER_SITE_OTHER): Payer: Self-pay | Admitting: Gastroenterology

## 2024-08-16 DIAGNOSIS — R112 Nausea with vomiting, unspecified: Secondary | ICD-10-CM

## 2024-08-16 MED ORDER — ONDANSETRON HCL 4 MG PO TABS: 4.0000 mg | ORAL_TABLET | Freq: Three times a day (TID) | ORAL | 1 refills | Status: DC | PRN

## 2024-08-16 NOTE — Telephone Encounter (Signed)
 Pt had ERCP completed yesterday at [facility] and states she had a stint placed in her bile duct. Pt is having continued nausea and diarrhea. Also having some throat pain. [facility] also advised pt to follow up with GI. No appts available at this time. Please advise. Thank you  [facility] [address] [place].

## 2024-08-16 NOTE — Telephone Encounter (Signed)
 I sent her prescription for Zofran .  If patient is having continued nausea and diarrhea, or worsening abdominal pain, she will need to go to the ER to have further evaluation. Devere, please check if the patient can be seen at an earlier appointment with any of the APPs.

## 2024-08-16 NOTE — Telephone Encounter (Signed)
Pt contacted and made aware. Pt verbalized understanding.

## 2024-08-16 NOTE — Progress Notes (Signed)
 SABRA

## 2024-09-05 ENCOUNTER — Ambulatory Visit (INDEPENDENT_AMBULATORY_CARE_PROVIDER_SITE_OTHER): Admitting: Gastroenterology

## 2024-09-08 ENCOUNTER — Other Ambulatory Visit: Payer: Self-pay

## 2024-09-08 ENCOUNTER — Emergency Department (HOSPITAL_COMMUNITY)

## 2024-09-08 ENCOUNTER — Inpatient Hospital Stay (HOSPITAL_COMMUNITY)
Admission: EM | Admit: 2024-09-08 | Discharge: 2024-09-11 | DRG: 440 | Disposition: A | Discharge status: Home / Self Care | Attending: Internal Medicine | Admitting: Internal Medicine

## 2024-09-08 ENCOUNTER — Encounter (HOSPITAL_COMMUNITY): Payer: Self-pay | Admitting: Internal Medicine

## 2024-09-08 DIAGNOSIS — Z800 Family history of malignant neoplasm of digestive organs: Secondary | ICD-10-CM

## 2024-09-08 DIAGNOSIS — Z79891 Long term (current) use of opiate analgesic: Secondary | ICD-10-CM

## 2024-09-08 DIAGNOSIS — E876 Hypokalemia: Secondary | ICD-10-CM | POA: Diagnosis not present

## 2024-09-08 DIAGNOSIS — K858 Other acute pancreatitis without necrosis or infection: Secondary | ICD-10-CM | POA: Diagnosis not present

## 2024-09-08 DIAGNOSIS — Z8673 Personal history of transient ischemic attack (TIA), and cerebral infarction without residual deficits: Secondary | ICD-10-CM

## 2024-09-08 DIAGNOSIS — F17210 Nicotine dependence, cigarettes, uncomplicated: Secondary | ICD-10-CM | POA: Diagnosis present

## 2024-09-08 DIAGNOSIS — R109 Unspecified abdominal pain: Secondary | ICD-10-CM | POA: Diagnosis present

## 2024-09-08 DIAGNOSIS — D509 Iron deficiency anemia, unspecified: Secondary | ICD-10-CM | POA: Diagnosis present

## 2024-09-08 DIAGNOSIS — K838 Other specified diseases of biliary tract: Secondary | ICD-10-CM | POA: Diagnosis present

## 2024-09-08 DIAGNOSIS — K315 Obstruction of duodenum: Secondary | ICD-10-CM

## 2024-09-08 DIAGNOSIS — K219 Gastro-esophageal reflux disease without esophagitis: Secondary | ICD-10-CM | POA: Diagnosis present

## 2024-09-08 DIAGNOSIS — Z79899 Other long term (current) drug therapy: Secondary | ICD-10-CM

## 2024-09-08 DIAGNOSIS — G8929 Other chronic pain: Secondary | ICD-10-CM | POA: Diagnosis present

## 2024-09-08 DIAGNOSIS — Z833 Family history of diabetes mellitus: Secondary | ICD-10-CM

## 2024-09-08 DIAGNOSIS — F419 Anxiety disorder, unspecified: Secondary | ICD-10-CM | POA: Diagnosis present

## 2024-09-08 DIAGNOSIS — Z823 Family history of stroke: Secondary | ICD-10-CM

## 2024-09-08 DIAGNOSIS — K859 Acute pancreatitis without necrosis or infection, unspecified: Principal | ICD-10-CM | POA: Diagnosis present

## 2024-09-08 DIAGNOSIS — Z8249 Family history of ischemic heart disease and other diseases of the circulatory system: Secondary | ICD-10-CM

## 2024-09-08 DIAGNOSIS — K581 Irritable bowel syndrome with constipation: Secondary | ICD-10-CM | POA: Diagnosis present

## 2024-09-08 DIAGNOSIS — R112 Nausea with vomiting, unspecified: Secondary | ICD-10-CM | POA: Diagnosis present

## 2024-09-08 DIAGNOSIS — Z9689 Presence of other specified functional implants: Secondary | ICD-10-CM | POA: Diagnosis present

## 2024-09-08 DIAGNOSIS — Z818 Family history of other mental and behavioral disorders: Secondary | ICD-10-CM

## 2024-09-08 DIAGNOSIS — Z825 Family history of asthma and other chronic lower respiratory diseases: Secondary | ICD-10-CM

## 2024-09-08 DIAGNOSIS — J4489 Other specified chronic obstructive pulmonary disease: Secondary | ICD-10-CM | POA: Diagnosis present

## 2024-09-08 LAB — HCG, SERUM, QUALITATIVE: Preg, Serum: NEGATIVE

## 2024-09-08 LAB — CBC
HCT: 39.9 % (ref 36.0–46.0)
Hemoglobin: 12.6 g/dL (ref 12.0–15.0)
MCH: 20.1 pg — ABNORMAL LOW (ref 26.0–34.0)
MCHC: 31.6 g/dL (ref 30.0–36.0)
MCV: 63.7 fL — ABNORMAL LOW (ref 80.0–100.0)
Platelets: 472 K/uL — ABNORMAL HIGH (ref 150–400)
RBC: 6.26 MIL/uL — ABNORMAL HIGH (ref 3.87–5.11)
RDW: 17.5 % — ABNORMAL HIGH (ref 11.5–15.5)
WBC: 11.9 K/uL — ABNORMAL HIGH (ref 4.0–10.5)
nRBC: 0 % (ref 0.0–0.2)

## 2024-09-08 LAB — COMPREHENSIVE METABOLIC PANEL WITH GFR
ALT: 14 U/L (ref 0–44)
AST: 23 U/L (ref 15–41)
Albumin: 4.3 g/dL (ref 3.5–5.0)
Alkaline Phosphatase: 81 U/L (ref 38–126)
Anion gap: 14 (ref 5–15)
BUN: 11 mg/dL (ref 6–20)
CO2: 25 mmol/L (ref 22–32)
Calcium: 10 mg/dL (ref 8.9–10.3)
Chloride: 102 mmol/L (ref 98–111)
Creatinine, Ser: 0.7 mg/dL (ref 0.44–1.00)
GFR, Estimated: >60 mL/min (ref >60)
Glucose, Bld: 124 mg/dL — ABNORMAL HIGH (ref 70–99)
Potassium: 4.3 mmol/L (ref 3.5–5.1)
Sodium: 141 mmol/L (ref 135–145)
Total Bilirubin: 0.6 mg/dL (ref 0.0–1.2)
Total Protein: 7.8 g/dL (ref 6.5–8.1)

## 2024-09-08 LAB — LIPASE, BLOOD: Lipase: 348 U/L — ABNORMAL HIGH (ref 11–51)

## 2024-09-08 MED ORDER — DICYCLOMINE HCL 10 MG PO CAPS
10.0000 mg | ORAL_CAPSULE | Freq: Three times a day (TID) | ORAL | Status: DC
  Administered 2024-09-09 – 2024-09-11 (×8): 10 mg via ORAL
  Filled 2024-09-08 (×8): qty 1

## 2024-09-08 MED ORDER — LINACLOTIDE 145 MCG PO CAPS
145.0000 ug | ORAL_CAPSULE | Freq: Every day | ORAL | Status: DC
  Administered 2024-09-08 – 2024-09-10 (×2): 145 ug via ORAL
  Filled 2024-09-08 (×2): qty 1

## 2024-09-08 MED ORDER — METOCLOPRAMIDE HCL 5 MG/ML IJ SOLN
5.0000 mg | Freq: Once | INTRAMUSCULAR | Status: AC
Start: 2024-09-08 — End: 2024-09-08
  Administered 2024-09-08: 5 mg via INTRAVENOUS
  Filled 2024-09-08: qty 2

## 2024-09-08 MED ORDER — OXYCODONE HCL 5 MG PO TABS
5.0000 mg | ORAL_TABLET | ORAL | Status: DC | PRN
  Administered 2024-09-09 – 2024-09-11 (×10): 5 mg via ORAL
  Filled 2024-09-08 (×10): qty 1

## 2024-09-08 MED ORDER — DULOXETINE HCL 60 MG PO CPEP
60.0000 mg | ORAL_CAPSULE | Freq: Every day | ORAL | Status: DC
Start: 2024-09-08 — End: 2024-09-11
  Administered 2024-09-08 – 2024-09-11 (×4): 60 mg via ORAL
  Filled 2024-09-08 (×4): qty 1

## 2024-09-08 MED ORDER — HEPARIN SODIUM (PORCINE) 5000 UNIT/ML IJ SOLN
5000.0000 [IU] | Freq: Three times a day (TID) | INTRAMUSCULAR | Status: DC
  Administered 2024-09-09 – 2024-09-11 (×4): 5000 [IU] via SUBCUTANEOUS
  Filled 2024-09-08 (×5): qty 1

## 2024-09-08 MED ORDER — PANTOPRAZOLE SODIUM 40 MG PO TBEC
40.0000 mg | DELAYED_RELEASE_TABLET | Freq: Every day | ORAL | Status: DC
  Administered 2024-09-08 – 2024-09-11 (×4): 40 mg via ORAL
  Filled 2024-09-08 (×4): qty 1

## 2024-09-08 MED ORDER — ONDANSETRON HCL 4 MG PO TABS
4.0000 mg | ORAL_TABLET | Freq: Four times a day (QID) | ORAL | Status: DC | PRN
  Administered 2024-09-10: 4 mg via ORAL
  Filled 2024-09-08: qty 1

## 2024-09-08 MED ORDER — SODIUM CHLORIDE 0.9% FLUSH
3.0000 mL | Freq: Once | INTRAVENOUS | Status: AC
  Administered 2024-09-08: 3 mL via INTRAVENOUS

## 2024-09-08 MED ORDER — ONDANSETRON HCL 4 MG/2ML IJ SOLN
4.0000 mg | Freq: Once | INTRAMUSCULAR | Status: AC
  Administered 2024-09-08: 4 mg via INTRAVENOUS
  Filled 2024-09-08: qty 2

## 2024-09-08 MED ORDER — IOHEXOL 300 MG/ML  SOLN
100.0000 mL | Freq: Once | INTRAMUSCULAR | Status: AC | PRN
  Administered 2024-09-08: 100 mL via INTRAVENOUS

## 2024-09-08 MED ORDER — HYDROMORPHONE HCL 1 MG/ML IJ SOLN
0.5000 mg | Freq: Once | INTRAMUSCULAR | Status: AC
  Administered 2024-09-08: 0.5 mg via INTRAVENOUS
  Filled 2024-09-08: qty 0.5

## 2024-09-08 MED ORDER — LACTATED RINGERS IV SOLN: INTRAVENOUS | Status: DC

## 2024-09-08 MED ORDER — ONDANSETRON HCL 4 MG/2ML IJ SOLN
4.0000 mg | Freq: Once | INTRAMUSCULAR | Status: AC | PRN
  Administered 2024-09-08: 4 mg via INTRAVENOUS
  Filled 2024-09-08: qty 2

## 2024-09-08 MED ORDER — HYDROMORPHONE HCL 1 MG/ML IJ SOLN
0.5000 mg | INTRAMUSCULAR | Status: DC | PRN
  Administered 2024-09-08 – 2024-09-09 (×3): 1 mg via INTRAVENOUS
  Filled 2024-09-08 (×3): qty 1

## 2024-09-08 MED ORDER — BUPRENORPHINE HCL-NALOXONE HCL 8-2 MG SL SUBL
1.0000 | SUBLINGUAL_TABLET | Freq: Every day | SUBLINGUAL | Status: DC
  Administered 2024-09-09: 1 via SUBLINGUAL
  Filled 2024-09-08 (×3): qty 1

## 2024-09-08 MED ORDER — ONDANSETRON HCL 4 MG/2ML IJ SOLN
4.0000 mg | Freq: Four times a day (QID) | INTRAMUSCULAR | Status: DC | PRN
  Administered 2024-09-09 (×2): 4 mg via INTRAVENOUS
  Filled 2024-09-08: qty 2

## 2024-09-08 MED ORDER — SODIUM CHLORIDE 0.9 % IV BOLUS
1000.0000 mL | Freq: Once | INTRAVENOUS | Status: AC
  Administered 2024-09-08: 1000 mL via INTRAVENOUS

## 2024-09-08 MED ORDER — ALPRAZOLAM 0.5 MG PO TABS
0.5000 mg | ORAL_TABLET | Freq: Two times a day (BID) | ORAL | Status: DC
Start: 2024-09-08 — End: 2024-09-11
  Administered 2024-09-08 – 2024-09-11 (×6): 0.5 mg via ORAL
  Filled 2024-09-08 (×6): qty 1

## 2024-09-08 MED ORDER — ALBUTEROL SULFATE (2.5 MG/3ML) 0.083% IN NEBU: 2.5000 mg | INHALATION_SOLUTION | Freq: Four times a day (QID) | RESPIRATORY_TRACT | Status: DC | PRN

## 2024-09-08 NOTE — H&P (Signed)
 [facility] H&P   Patient Demographics:    [name], is a 48 y.o. female  MRN: [id number]   DOB - Aug 24, 1976  Admit Date - 09/08/2024  Outpatient Primary MD for the patient is Orpha Yancey LABOR, MD  Referring MD/NP/PA: Dr. Mai  Outpatient Specialists: Gastroenterology Dr. Cinderella  Patient coming from: Home  Chief Complaint  Patient presents with   Abdominal Pain      HPI:    [name]  is a 48 y.o. female, with past medical history of COPD, GERD,.  Stricture, status post recent ERCP/EUS with biliary duct stent and pancreatic duct stent at [facility], EUS biopsy negative for malignancy, patient with intermittent symptoms of abdominal pain and nausea, but over the last 48 hours she has been having significant abdominal pain, nausea, vomiting, no fever, no chills, which prompted her to come to ED - In ED no LFTs abnormalities, workup significant for elevated lipase, CT abdomen pelvis with patent biliary stent, and pancreatic duct stent appears to be migrated in the colon, she is with significant pain, nausea required multiple dosing of nausea and pain meds so [facility] hospitalist consulted to admit.    Review of systems:   A full 10 point Review of Systems was done, except as stated above, all other Review of Systems were negative.   With Past History of the following :    Past Medical History:  Diagnosis Date   Anxiety    Asthma    COPD (chronic obstructive pulmonary disease) (HCC)    Depression    GERD (gastroesophageal reflux disease)    Stroke (HCC)    left side weakness      Past Surgical History:  Procedure Laterality Date   BLADDER SURGERY     CHOLECYSTECTOMY     COLONOSCOPY N/A 06/07/2024   Procedure: COLONOSCOPY;  Surgeon: Cinderella Deatrice FALCON, MD;  [facility];  Service: Endoscopy;  Laterality: N/A;  1:15 pm, asa 1/2   ESOPHAGOGASTRODUODENOSCOPY N/A  04/10/2014   Procedure: ESOPHAGOGASTRODUODENOSCOPY (EGD);  Surgeon: Claudis RAYMOND Rivet, MD;  Location: [facility];  Service: Endoscopy;  Laterality: N/A;   ESOPHAGOGASTRODUODENOSCOPY N/A 06/07/2024   Procedure: EGD (ESOPHAGOGASTRODUODENOSCOPY);  Surgeon: Cinderella Deatrice FALCON, MD;  Location: [facility];  Service: Endoscopy;  Laterality: N/A;      Social History:     Social History   Tobacco Use   Smoking status: Every Day    Current packs/day: 1.00    Average packs/day: 1 pack/day for 13.0 years (13.0 ttl pk-yrs)    Types: Cigarettes   Smokeless tobacco: Never  Substance Use Topics   Alcohol use: No       Family History :     Family History  Problem Relation Age of Onset   Cancer Mother    Pancreatic cancer Mother    Stroke Mother    Seizures Mother    Diabetes  Father    Hypertension Father       Home Medications:   Prior to Admission medications   Medication Sig Start Date End Date Taking? Authorizing Provider  albuterol  (VENTOLIN  HFA) 108 (90 Base) MCG/ACT inhaler Inhale 2 puffs into the lungs every 6 (six) hours as needed for wheezing or shortness of breath. 09/29/21  Yes Comer Kirsch, PA-C  ALPRAZolam  (XANAX ) 0.5 MG tablet Take 0.5 mg by mouth 2 (two) times daily.   Yes [provider]  dicyclomine  (BENTYL ) 20 MG tablet Take 0.5 tablets (10 mg total) by mouth 3 (three) times daily before meals. 06/07/24  Yes Ahmed, Deatrice FALCON, MD  DULoxetine  (CYMBALTA ) 60 MG capsule Take 60 mg by mouth daily.   Yes [provider]  linaclotide  (LINZESS ) 145 MCG CAPS capsule Take 1 capsule (145 mcg total) by mouth daily. 07/17/24 01/13/25 Yes Ahmed, Deatrice FALCON, MD  pantoprazole  (PROTONIX ) 40 MG tablet Take 1 tablet (40 mg total) by mouth daily. 05/09/24  Yes Ahmed, Muhammad F, MD  SUBOXONE 8-2 MG FILM Place 1 Film under the tongue every 8 (eight) hours. 08/29/24  Yes [provider]     Allergies:    No Known Allergies   Physical Exam:   Vitals  Blood  pressure (!) 142/71, pulse (!) 56, temperature 98.6 F (37 C), temperature source Oral, resp. rate 18, height 4' 9 (1.448 m), weight 54 kg, SpO2 100%.   1. General Well-developed female, laying in bed, in no apparent distress  2. Normal affect and insight, Not Suicidal or Homicidal, Awake Alert, Oriented X 3.  3. No F.N deficits, ALL C.Nerves Intact, Strength 5/5 all 4 extremities, Sensation intact all 4 extremities, Plantars down going.  4. Ears and Eyes appear Normal, Conjunctivae clear, PERRLA. Moist Oral Mucosa.  5. Supple Neck, No JVD, No cervical lymphadenopathy appriciated, No Carotid Bruits.  6. Symmetrical Chest wall movement, Good air movement bilaterally, CTAB.  7. RRR, No Gallops, Rubs or Murmurs, No Parasternal Heave.  8. Positive Bowel Sounds, Abdomen Soft, mild epigastric tenderness, No organomegaly appriciated,No rebound -guarding or rigidity.  9.  No Cyanosis, Normal Skin Turgor, No Skin Rash or Bruise.  10. Good muscle tone,  joints appear normal , no effusions, Normal ROM.    Data Review:    CBC Recent Labs  Lab 09/08/24 1538  WBC 11.9*  HGB 12.6  HCT 39.9  PLT 472*  MCV 63.7*  MCH 20.1*  MCHC 31.6  RDW 17.5*   ------------------------------------------------------------------------------------------------------------------  Chemistries  Recent Labs  Lab 09/08/24 1538  NA 141  K 4.3  CL 102  CO2 25  GLUCOSE 124*  BUN 11  CREATININE 0.70  CALCIUM 10.0  AST 23  ALT 14  ALKPHOS 81  BILITOT 0.6   ------------------------------------------------------------------------------------------------------------------ estimated creatinine clearance is 60.2 mL/min (by C-G formula based on SCr of 0.7 mg/dL). ------------------------------------------------------------------------------------------------------------------ No results for input(s): TSH, T4TOTAL, T3FREE, THYROIDAB in the last 72 hours.  Invalid input(s): FREET3  Coagulation  profile No results for input(s): INR, PROTIME in the last 168 hours. ------------------------------------------------------------------------------------------------------------------- No results for input(s): DDIMER in the last 72 hours. -------------------------------------------------------------------------------------------------------------------  Cardiac Enzymes No results for input(s): CKMB, TROPONINI, MYOGLOBIN in the last 168 hours.  Invalid input(s): CK ------------------------------------------------------------------------------------------------------------------ No results found for: BNP   ---------------------------------------------------------------------------------------------------------------  Urinalysis    Component Value Date/Time   COLORURINE YELLOW 09/04/2021 1446   APPEARANCEUR HAZY (A) 09/04/2021 1446   LABSPEC 1.009 09/04/2021 1446   PHURINE 6.0 09/04/2021 1446   GLUCOSEU NEGATIVE  09/04/2021 1446   HGBUR MODERATE (A) 09/04/2021 1446   BILIRUBINUR NEGATIVE 09/04/2021 1446   BILIRUBINUR small (A) 09/04/2021 1053   KETONESUR 5 (A) 09/04/2021 1446   KETONESUR small (15) (A) 09/04/2021 1053   PROTEINUR NEGATIVE 09/04/2021 1446   PROTEINUR =30 (A) 09/04/2021 1053   UROBILINOGEN 0.2 09/04/2021 1053   UROBILINOGEN 0.2 06/10/2015 1640   NITRITE NEGATIVE 09/04/2021 1446   NITRITE Positive (A) 09/04/2021 1053   LEUKOCYTESUR NEGATIVE 09/04/2021 1446   LEUKOCYTESUR Trace (A) 09/04/2021 1053    ----------------------------------------------------------------------------------------------------------------   Imaging Results:    CT ABDOMEN PELVIS W CONTRAST Result Date: 09/08/2024 EXAM: CT ABDOMEN AND PELVIS WITH CONTRAST 09/08/2024 05:49:04 PM TECHNIQUE: CT of the abdomen and pelvis was performed with the administration of 100 mL of iohexol  (OMNIPAQUE ) 300 MG/ML solution. Multiplanar reformatted images are provided for review. Automated  exposure control, iterative reconstruction, and/or weight-based adjustment of the mA/kV was utilized to reduce the radiation dose to as low as reasonably achievable. COMPARISON: CT 09/04/2021 and MRI abdomen 08/31/2024. CLINICAL HISTORY: Abdominal pain, acute, nonlocalized; Abdominal pain, post-op; Pancreatitis, acute, severe. Abd pain, acute, nonlocalized; Post-op, pancreatitis, acute, severe; Omnipaque  300; 100 cc ; No PO per ordering MD; Pt called EMS because she was having severe abd pain. States that she can't stop throwing up. VS within normal limits. Rebound tenderness RUQ. FINDINGS: LOWER CHEST: No acute abnormality. LIVER: Pneumobilia. GALLBLADDER AND BILE DUCTS: Cholecystectomy. Similar dilation of the common bile duct measuring up to 17 mm in diameter. Interval placement of a biliary stent with pigtails in the common bile duct and distal duodenum. Mucosal Hyperenhancement and wall thickening of the common bile duct may be related to recent stent placement or cholangitis. SPLEEN: No acute abnormality. PANCREAS: No pancreatic ductal dilation. No peripancreatic inflammatory stranding or fluid. ADRENAL GLANDS: No acute abnormality. KIDNEYS, URETERS AND BLADDER: No stones in the kidneys or ureters. No hydronephrosis. No perinephric or periureteral stranding. Urinary bladder is unremarkable. GI AND BOWEL: Stomach demonstrates no acute abnormality. 5.8 cm pigtail stent in the descending colon. Question translocation of a previous biliary stent. Correlate with history. History. There is no bowel obstruction. PERITONEUM AND RETROPERITONEUM: Small volume free fluid in the pelvis. No intraabdominal or pelvic abscess. No free intraperitoneal air. VASCULATURE: Aorta is normal in caliber. LYMPH NODES: No lymphadenopathy. REPRODUCTIVE ORGANS: No acute abnormality. BONES AND SOFT TISSUES: No acute osseous abnormality. No focal soft tissue abnormality. IMPRESSION: 1. Interval placement of a biliary stent with pigtails in  the common bile duct and distal duodenum; mucosal hyperenhancement and wall thickening of the dilated common bile duct may reflect post-stent changes or cholangitis. 2. 5.8 cm pigtail stent in the descending colon, which may represent migration of a prior biliary stent; correlate with history. Electronically signed by: Norman Gatlin MD 09/08/2024 06:23 PM EDT RP Workstation: [id number]       Assessment & Plan:    Principal Problem:   Acute pancreatitis Active Problems:   Abdominal pain   Nausea and vomiting in adult   Abdominal pain/nausea/vomiting Acute pancreatitis -Significant for acute pancreatitis with elevated lipase level, recent pancreatic duct stent has migrated currently appears to be in the colon, as discussed with GI, pancreatic duct stent migration is expected post this procedure. - Appears to be with mild pancreatitis, recommendation for as needed pain medicine, nausea medicine, IV fluids and has been n.p.o. - GI to see in a.m.  History of bile duct stenosis/stricture - Post recent ERCP/EUS, with pigtail bile duct stent placement/pancreatic duct placement -  EUS biopsy with no evidence of malignancy - Bile duct stent appears to be with no issues on imaging, LFTs within normal limit, does not appear to be with any acute issues - Patient scheduled for follow-up at [facility] in October - Further management per GI  Anxiety - Continue with home dose Xanax , and Cymbalta . - she appears to be on Suboxone as an outpatient and currently being tapered off  GERD -Continue with PPIDVT Prophylaxis Heparin  AM Labs Ordered, also please review Full Orders  Family Communication: Admission, patients condition and plan of care including tests being ordered have been discussed with the patient who indicate understanding and agree with the plan and Code Status.  Code Status full code  Likely DC to home  Consults called: GI  Admission status: Observation  Time spent in minutes : 60  minutes   Brayton Lye M.D on 09/08/2024 at 7:52 PM   [facility] - Office  [phone]

## 2024-09-08 NOTE — ED Triage Notes (Signed)
 Pt arrived via [facility]. Pt called EMS because she was having severe abd pain. States that she can't stop throwing up. VS within normal limits. Rebound tenderness RUQ. CBG 143. Pt dry heaving in bed while putting finger down throat trying to induce vomiting.

## 2024-09-08 NOTE — ED Provider Notes (Signed)
 [facility] EMERGENCY DEPARTMENT AT [facility] Provider Note  CSN: [id number] Arrival date & time: 09/08/24 1514  Chief Complaint(s) Abdominal Pain  HPI [name] is a 48 y.o. female history of COPD, GERD, biliary strictures status post recent ERCP/stent beginning of this month presenting with abdominal pain.  Reports intermittent abdominal pain since procedure worse today.  Reports associated vomiting.  Pain is mainly in the upper abdomen.  No fevers or chills.  No chest pain or shortness of breath.  No radiation.  No urinary symptoms.  No diarrhea.  No black or bloody stools.   Past Medical History Past Medical History:  Diagnosis Date   Anxiety    Asthma    COPD (chronic obstructive pulmonary disease) (HCC)    Depression    GERD (gastroesophageal reflux disease)    Stroke (HCC)    left side weakness   Patient Active Problem List   Diagnosis Date Noted   Common bile duct dilatation 07/17/2024   Gastritis and gastroduodenitis 06/07/2024   Esophageal stricture 06/07/2024   Adenomatous polyp of ascending colon 06/07/2024   Irritable bowel syndrome 05/09/2024   Family history of pancreatic cancer 05/09/2024   Colon cancer screening 05/09/2024   Esophageal dysphagia 05/09/2024   Macrocytic anemia 04/12/2014   Abdominal pain 04/07/2014   Nausea and vomiting in adult 04/07/2014   Acute diarrhea 04/07/2014   Acute pancreatitis 04/07/2014   Asthma 04/07/2014   Home Medication(s) Prior to Admission medications   Medication Sig Start Date End Date Taking? Authorizing Provider  albuterol  (VENTOLIN  HFA) 108 (90 Base) MCG/ACT inhaler Inhale 2 puffs into the lungs every 6 (six) hours as needed for wheezing or shortness of breath. 09/29/21  Yes Comer Kirsch, PA-C  ALPRAZolam  (XANAX ) 0.5 MG tablet Take 0.5 mg by mouth 2 (two) times daily.   Yes [provider]  dicyclomine  (BENTYL ) 20 MG tablet Take 0.5 tablets (10 mg total) by mouth 3 (three) times daily  before meals. 06/07/24  Yes Ahmed, Deatrice FALCON, MD  DULoxetine  (CYMBALTA ) 60 MG capsule Take 60 mg by mouth daily.   Yes [provider]  linaclotide  (LINZESS ) 145 MCG CAPS capsule Take 1 capsule (145 mcg total) by mouth daily. 07/17/24 01/13/25 Yes Ahmed, Deatrice FALCON, MD  pantoprazole  (PROTONIX ) 40 MG tablet Take 1 tablet (40 mg total) by mouth daily. 05/09/24  Yes Ahmed, Muhammad F, MD  SUBOXONE 8-2 MG FILM Place 1 Film under the tongue every 8 (eight) hours. 08/29/24  Yes [provider]  cyclobenzaprine  (FLEXERIL ) 5 MG tablet Take 1 tablet (5 mg total) by mouth 2 (two) times daily as needed for muscle spasms. 03/09/23   Small, Brooke L, PA  ondansetron  (ZOFRAN ) 4 MG tablet Take 1 tablet (4 mg total) by mouth every 8 (eight) hours as needed for nausea or vomiting. 08/16/24   Eartha Angelia Sieving, MD  polyethylene glycol powder (GLYCOLAX /MIRALAX ) 17 GM/SCOOP powder Take 8.5 g by mouth daily. 06/07/24   Cinderella Deatrice FALCON, MD  Past Surgical History Past Surgical History:  Procedure Laterality Date   BLADDER SURGERY     CHOLECYSTECTOMY     COLONOSCOPY N/A 06/07/2024   Procedure: COLONOSCOPY;  Surgeon: Cinderella Deatrice FALCON, MD;  Location: [facility];  Service: Endoscopy;  Laterality: N/A;  1:15 pm, asa 1/2   ESOPHAGOGASTRODUODENOSCOPY N/A 04/10/2014   Procedure: ESOPHAGOGASTRODUODENOSCOPY (EGD);  Surgeon: Claudis RAYMOND Rivet, MD;  Location: [facility];  Service: Endoscopy;  Laterality: N/A;   ESOPHAGOGASTRODUODENOSCOPY N/A 06/07/2024   Procedure: EGD (ESOPHAGOGASTRODUODENOSCOPY);  Surgeon: Cinderella Deatrice FALCON, MD;  Location: [facility];  Service: Endoscopy;  Laterality: N/A;   Family History Family History  Problem Relation Age of Onset   Cancer Mother    Pancreatic cancer Mother    Stroke Mother    Seizures Mother    Diabetes Father    Hypertension  Father     Social History Social History   Tobacco Use   Smoking status: Every Day    Current packs/day: 1.00    Average packs/day: 1 pack/day for 13.0 years (13.0 ttl pk-yrs)    Types: Cigarettes   Smokeless tobacco: Never  Vaping Use   Vaping status: Never Used  Substance Use Topics   Alcohol use: No   Drug use: Not Currently    Comment: no MJ since age 87 per pt but + UDS 2008   Allergies Patient has no known allergies.  Review of Systems Review of Systems  All other systems reviewed and are negative.   Physical Exam Vital Signs  I have reviewed the triage vital signs BP (!) 142/71 (BP Location: Left Arm)   Pulse (!) 56   Temp 98.6 F (37 C) (Oral)   Resp 18   Ht 4' 9 (1.448 m)   Wt 54 kg   SpO2 100%   BMI 25.76 kg/m  Physical Exam Vitals and nursing note reviewed.  Constitutional:      General: She is not in acute distress.    Appearance: She is well-developed.  HENT:     Head: Normocephalic and atraumatic.     Mouth/Throat:     Mouth: Mucous membranes are moist.  Eyes:     Pupils: Pupils are equal, round, and reactive to light.  Cardiovascular:     Rate and Rhythm: Normal rate and regular rhythm.     Heart sounds: No murmur heard. Pulmonary:     Effort: Pulmonary effort is normal. No respiratory distress.     Breath sounds: Normal breath sounds.  Abdominal:     General: Abdomen is flat.     Palpations: Abdomen is soft.     Tenderness: There is abdominal tenderness in the right upper quadrant and epigastric area.  Musculoskeletal:        General: No tenderness.     Right lower leg: No edema.     Left lower leg: No edema.  Skin:    General: Skin is warm and dry.  Neurological:     General: No focal deficit present.     Mental Status: She is alert. Mental status is at baseline.  Psychiatric:        Mood and Affect: Mood normal.        Behavior: Behavior normal.     ED Results and Treatments Labs (all labs ordered are listed, but only  abnormal results are displayed) Labs Reviewed  LIPASE, BLOOD - Abnormal; Notable for the following components:      Result Value   Lipase 348 (*)  All other components within normal limits  COMPREHENSIVE METABOLIC PANEL WITH GFR - Abnormal; Notable for the following components:   Glucose, Bld 124 (*)    All other components within normal limits  CBC - Abnormal; Notable for the following components:   WBC 11.9 (*)    RBC 6.26 (*)    MCV 63.7 (*)    MCH 20.1 (*)    RDW 17.5 (*)    Platelets 472 (*)    All other components within normal limits  HCG, SERUM, QUALITATIVE  URINALYSIS, ROUTINE W REFLEX MICROSCOPIC  POC URINE PREG, ED                                                                                                                          Radiology CT ABDOMEN PELVIS W CONTRAST Result Date: 09/08/2024 EXAM: CT ABDOMEN AND PELVIS WITH CONTRAST 09/08/2024 05:49:04 PM TECHNIQUE: CT of the abdomen and pelvis was performed with the administration of 100 mL of iohexol  (OMNIPAQUE ) 300 MG/ML solution. Multiplanar reformatted images are provided for review. Automated exposure control, iterative reconstruction, and/or weight-based adjustment of the mA/kV was utilized to reduce the radiation dose to as low as reasonably achievable. COMPARISON: CT 09/04/2021 and MRI abdomen 08/31/2024. CLINICAL HISTORY: Abdominal pain, acute, nonlocalized; Abdominal pain, post-op; Pancreatitis, acute, severe. Abd pain, acute, nonlocalized; Post-op, pancreatitis, acute, severe; Omnipaque  300; 100 cc ; No PO per ordering MD; Pt called EMS because she was having severe abd pain. States that she can't stop throwing up. VS within normal limits. Rebound tenderness RUQ. FINDINGS: LOWER CHEST: No acute abnormality. LIVER: Pneumobilia. GALLBLADDER AND BILE DUCTS: Cholecystectomy. Similar dilation of the common bile duct measuring up to 17 mm in diameter. Interval placement of a biliary stent with pigtails in the common  bile duct and distal duodenum. Mucosal Hyperenhancement and wall thickening of the common bile duct may be related to recent stent placement or cholangitis. SPLEEN: No acute abnormality. PANCREAS: No pancreatic ductal dilation. No peripancreatic inflammatory stranding or fluid. ADRENAL GLANDS: No acute abnormality. KIDNEYS, URETERS AND BLADDER: No stones in the kidneys or ureters. No hydronephrosis. No perinephric or periureteral stranding. Urinary bladder is unremarkable. GI AND BOWEL: Stomach demonstrates no acute abnormality. 5.8 cm pigtail stent in the descending colon. Question translocation of a previous biliary stent. Correlate with history. History. There is no bowel obstruction. PERITONEUM AND RETROPERITONEUM: Small volume free fluid in the pelvis. No intraabdominal or pelvic abscess. No free intraperitoneal air. VASCULATURE: Aorta is normal in caliber. LYMPH NODES: No lymphadenopathy. REPRODUCTIVE ORGANS: No acute abnormality. BONES AND SOFT TISSUES: No acute osseous abnormality. No focal soft tissue abnormality. IMPRESSION: 1. Interval placement of a biliary stent with pigtails in the common bile duct and distal duodenum; mucosal hyperenhancement and wall thickening of the dilated common bile duct may reflect post-stent changes or cholangitis. 2. 5.8 cm pigtail stent in the descending colon, which may represent migration of a prior biliary stent; correlate with history. Electronically signed by: Norman Gatlin  MD 09/08/2024 06:23 PM EDT RP Workstation: [id number]    Pertinent labs & imaging results that were available during my care of the patient were reviewed by me and considered in my medical decision making (see MDM for details).  Medications Ordered in ED Medications  sodium chloride  flush (NS) 0.9 % injection 3 mL (3 mLs Intravenous Given 09/08/24 1628)  ondansetron  (ZOFRAN ) injection 4 mg (4 mg Intravenous Given 09/08/24 1626)  sodium chloride  0.9 % bolus 1,000 mL (1,000 mLs Intravenous New  Bag/Given 09/08/24 1628)  HYDROmorphone  (DILAUDID ) injection 0.5 mg (0.5 mg Intravenous Given 09/08/24 1628)  ondansetron  (ZOFRAN ) injection 4 mg (4 mg Intravenous Given 09/08/24 1717)  iohexol  (OMNIPAQUE ) 300 MG/ML solution 100 mL (100 mLs Intravenous Contrast Given 09/08/24 1742)  HYDROmorphone  (DILAUDID ) injection 0.5 mg (0.5 mg Intravenous Given 09/08/24 1843)                                                                                                                                     Procedures Procedures  (including critical care time)  Medical Decision Making / ED Course   MDM:  48 year old presenting to the emergency department for abdominal pain.  Differential includes biliary process such as obstruction, cholangitis, choledocholithiasis, stent migration, pancreatitis,.  Patient overall well-appearing and afebrile.  Will obtain labs including LFTs and lipase.  Will check CT abdomen pelvis.  Will reassess.  Clinical Course as of 09/08/24 1926  Sat Sep 08, 2024  8075 Workup notable for elevated lipase.  Suspect pancreatitis.  CT scan without inflammation of the pancreas but given clinical picture seems most consistent.  Pancreatic duct stent seems to have migrated to colon but CBD stent is in place.  Unchanged dilation of biliary system.  LFTs normal, normal bilirubin, patient not jaundiced, doubt cholangitis or acute infectious process.  Discussed with Dr. Shaaron with GI, feels patient should be stable to stay at [facility] for pain control and they will consult.  Discussed with Dr. Sherlon with hospital service who will see patient. [WS]    Clinical Course User Index [WS] Francesca Elsie CROME, MD     Additional history obtained: -Additional history obtained from spouse -External records from outside source obtained and reviewed including: Chart review including previous notes, labs, imaging, consultation notes including prior [facility]    Lab Tests: -I  ordered, reviewed, and interpreted labs.   The pertinent results include:   Labs Reviewed  LIPASE, BLOOD - Abnormal; Notable for the following components:      Result Value   Lipase 348 (*)    All other components within normal limits  COMPREHENSIVE METABOLIC PANEL WITH GFR - Abnormal; Notable for the following components:   Glucose, Bld 124 (*)    All other components within normal limits  CBC - Abnormal; Notable for the following components:   WBC 11.9 (*)    RBC 6.26 (*)    MCV 63.7 (*)  MCH 20.1 (*)    RDW 17.5 (*)    Platelets 472 (*)    All other components within normal limits  HCG, SERUM, QUALITATIVE  URINALYSIS, ROUTINE W REFLEX MICROSCOPIC  POC URINE PREG, ED    Notable for elevated lipase   EKG   EKG Interpretation Date/Time:    Ventricular Rate:    PR Interval:    QRS Duration:    QT Interval:    QTC Calculation:   R Axis:      Text Interpretation:           Imaging Studies ordered: I ordered imaging studies including CT abdomen On my interpretation imaging demonstrates see mDM  I independently visualized and interpreted imaging. I agree with the radiologist interpretation   Medicines ordered and prescription drug management: Meds ordered this encounter  Medications   sodium chloride  flush (NS) 0.9 % injection 3 mL   ondansetron  (ZOFRAN ) injection 4 mg   sodium chloride  0.9 % bolus 1,000 mL   HYDROmorphone  (DILAUDID ) injection 0.5 mg   ondansetron  (ZOFRAN ) injection 4 mg   iohexol  (OMNIPAQUE ) 300 MG/ML solution 100 mL   HYDROmorphone  (DILAUDID ) injection 0.5 mg    -I have reviewed the patients home medicines and have made adjustments as needed   Consultations Obtained: I requested consultation with the GI,  and discussed lab and imaging findings as well as pertinent plan - they recommend: admission   Cardiac Monitoring: The patient was maintained on a cardiac monitor.  I personally viewed and interpreted the cardiac monitored which  showed an underlying rhythm of: NSR   Reevaluation: After the interventions noted above, I reevaluated the patient and found that their symptoms have improved  Co morbidities that complicate the patient evaluation  Past Medical History:  Diagnosis Date   Anxiety    Asthma    COPD (chronic obstructive pulmonary disease) (HCC)    Depression    GERD (gastroesophageal reflux disease)    Stroke (HCC)    left side weakness      Dispostion: Disposition decision including need for hospitalization was considered, and patient admitted to the hospital.    Final Clinical Impression(s) / ED Diagnoses Final diagnoses:  Acute pancreatitis without infection or necrosis, unspecified pancreatitis type     This chart was dictated using voice recognition software.  Despite best efforts to proofread,  errors can occur which can change the documentation meaning.    Francesca Elsie CROME, MD 09/08/24 [phone]

## 2024-09-09 DIAGNOSIS — K8500 Idiopathic acute pancreatitis without necrosis or infection: Secondary | ICD-10-CM | POA: Diagnosis not present

## 2024-09-09 DIAGNOSIS — G8929 Other chronic pain: Secondary | ICD-10-CM | POA: Diagnosis present

## 2024-09-09 DIAGNOSIS — R1010 Upper abdominal pain, unspecified: Secondary | ICD-10-CM | POA: Diagnosis not present

## 2024-09-09 DIAGNOSIS — K315 Obstruction of duodenum: Secondary | ICD-10-CM

## 2024-09-09 DIAGNOSIS — K581 Irritable bowel syndrome with constipation: Secondary | ICD-10-CM | POA: Diagnosis present

## 2024-09-09 DIAGNOSIS — F17210 Nicotine dependence, cigarettes, uncomplicated: Secondary | ICD-10-CM | POA: Diagnosis present

## 2024-09-09 DIAGNOSIS — Z9689 Presence of other specified functional implants: Secondary | ICD-10-CM | POA: Diagnosis present

## 2024-09-09 DIAGNOSIS — Z833 Family history of diabetes mellitus: Secondary | ICD-10-CM | POA: Diagnosis not present

## 2024-09-09 DIAGNOSIS — Z8249 Family history of ischemic heart disease and other diseases of the circulatory system: Secondary | ICD-10-CM | POA: Diagnosis not present

## 2024-09-09 DIAGNOSIS — Z818 Family history of other mental and behavioral disorders: Secondary | ICD-10-CM | POA: Diagnosis not present

## 2024-09-09 DIAGNOSIS — K838 Other specified diseases of biliary tract: Secondary | ICD-10-CM | POA: Diagnosis present

## 2024-09-09 DIAGNOSIS — K858 Other acute pancreatitis without necrosis or infection: Secondary | ICD-10-CM | POA: Diagnosis not present

## 2024-09-09 DIAGNOSIS — R1013 Epigastric pain: Secondary | ICD-10-CM | POA: Diagnosis not present

## 2024-09-09 DIAGNOSIS — Z8673 Personal history of transient ischemic attack (TIA), and cerebral infarction without residual deficits: Secondary | ICD-10-CM | POA: Diagnosis not present

## 2024-09-09 DIAGNOSIS — Z823 Family history of stroke: Secondary | ICD-10-CM | POA: Diagnosis not present

## 2024-09-09 DIAGNOSIS — E876 Hypokalemia: Secondary | ICD-10-CM | POA: Diagnosis not present

## 2024-09-09 DIAGNOSIS — R198 Other specified symptoms and signs involving the digestive system and abdomen: Secondary | ICD-10-CM | POA: Diagnosis not present

## 2024-09-09 DIAGNOSIS — D509 Iron deficiency anemia, unspecified: Secondary | ICD-10-CM | POA: Diagnosis present

## 2024-09-09 DIAGNOSIS — K219 Gastro-esophageal reflux disease without esophagitis: Secondary | ICD-10-CM | POA: Diagnosis present

## 2024-09-09 DIAGNOSIS — Z79891 Long term (current) use of opiate analgesic: Secondary | ICD-10-CM | POA: Diagnosis not present

## 2024-09-09 DIAGNOSIS — Z79899 Other long term (current) drug therapy: Secondary | ICD-10-CM | POA: Diagnosis not present

## 2024-09-09 DIAGNOSIS — J4489 Other specified chronic obstructive pulmonary disease: Secondary | ICD-10-CM | POA: Diagnosis present

## 2024-09-09 DIAGNOSIS — R112 Nausea with vomiting, unspecified: Secondary | ICD-10-CM

## 2024-09-09 DIAGNOSIS — K859 Acute pancreatitis without necrosis or infection, unspecified: Secondary | ICD-10-CM | POA: Diagnosis present

## 2024-09-09 DIAGNOSIS — Z825 Family history of asthma and other chronic lower respiratory diseases: Secondary | ICD-10-CM | POA: Diagnosis not present

## 2024-09-09 DIAGNOSIS — F419 Anxiety disorder, unspecified: Secondary | ICD-10-CM | POA: Diagnosis present

## 2024-09-09 DIAGNOSIS — Z800 Family history of malignant neoplasm of digestive organs: Secondary | ICD-10-CM | POA: Diagnosis not present

## 2024-09-09 LAB — BASIC METABOLIC PANEL WITH GFR
Anion gap: 10 (ref 5–15)
BUN: 11 mg/dL (ref 6–20)
CO2: 24 mmol/L (ref 22–32)
Calcium: 8.6 mg/dL — ABNORMAL LOW (ref 8.9–10.3)
Chloride: 106 mmol/L (ref 98–111)
Creatinine, Ser: 0.72 mg/dL (ref 0.44–1.00)
GFR, Estimated: >60 mL/min (ref >60)
Glucose, Bld: 108 mg/dL — ABNORMAL HIGH (ref 70–99)
Potassium: 3.5 mmol/L (ref 3.5–5.1)
Sodium: 140 mmol/L (ref 135–145)

## 2024-09-09 LAB — HEPATIC FUNCTION PANEL
ALT: 11 U/L (ref 0–44)
AST: 17 U/L (ref 15–41)
Albumin: 3.6 g/dL (ref 3.5–5.0)
Alkaline Phosphatase: 68 U/L (ref 38–126)
Bilirubin, Direct: <0.1 mg/dL (ref 0.0–0.2)
Total Bilirubin: 0.9 mg/dL (ref 0.0–1.2)
Total Protein: 6.8 g/dL (ref 6.5–8.1)

## 2024-09-09 LAB — CBC
HCT: 34.3 % — ABNORMAL LOW (ref 36.0–46.0)
Hemoglobin: 11 g/dL — ABNORMAL LOW (ref 12.0–15.0)
MCH: 20.4 pg — ABNORMAL LOW (ref 26.0–34.0)
MCHC: 32.1 g/dL (ref 30.0–36.0)
MCV: 63.8 fL — ABNORMAL LOW (ref 80.0–100.0)
Platelets: 408 K/uL — ABNORMAL HIGH (ref 150–400)
RBC: 5.38 MIL/uL — ABNORMAL HIGH (ref 3.87–5.11)
RDW: 16.4 % — ABNORMAL HIGH (ref 11.5–15.5)
WBC: 14.1 K/uL — ABNORMAL HIGH (ref 4.0–10.5)
nRBC: 0 % (ref 0.0–0.2)

## 2024-09-09 LAB — AMYLASE: Amylase: 319 U/L — ABNORMAL HIGH (ref 28–100)

## 2024-09-09 LAB — HIV ANTIBODY (ROUTINE TESTING W REFLEX): HIV Screen 4th Generation wRfx: NONREACTIVE

## 2024-09-09 LAB — LIPASE, BLOOD: Lipase: 311 U/L — ABNORMAL HIGH (ref 11–51)

## 2024-09-09 MED ORDER — HYDROMORPHONE HCL 1 MG/ML IJ SOLN
1.0000 mg | INTRAMUSCULAR | Status: DC | PRN
  Administered 2024-09-09 – 2024-09-11 (×10): 1 mg via INTRAVENOUS
  Filled 2024-09-09 (×10): qty 1

## 2024-09-09 MED ORDER — LACTATED RINGERS IV SOLN: INTRAVENOUS | Status: DC

## 2024-09-09 MED ORDER — METOCLOPRAMIDE HCL 5 MG/ML IJ SOLN
5.0000 mg | Freq: Three times a day (TID) | INTRAMUSCULAR | Status: DC
  Administered 2024-09-09 – 2024-09-11 (×7): 5 mg via INTRAVENOUS
  Filled 2024-09-09 (×7): qty 2

## 2024-09-09 MED ORDER — LACTATED RINGERS IV SOLN: INTRAVENOUS | Status: AC

## 2024-09-09 NOTE — Consult Note (Signed)
 Referring Provider: No ref. provider found Primary Care Physician:  Orpha Yancey LABOR, MD Primary Gastroenterologist:  Dr. Sean  Reason for Consultation:  abdominal pain  HPI: 48 year old lady admitted to the hospital with a 2-week history of severe epigastric right upper quadrant abdominal pain; lipase elevated at 348 in the ED last night; LFTs normal. CT demonstrated a chronically dilated bile duct 17mm with acute taper distally - biliary stent in place with pneumobilia;  pancreas notably appeared normal.  There was also another stent floating in the descending colon.   She has been admitted to for acute management of pancreatitis.  This lady's recent GI history is significant for a several month history of vague epigastric right upper quadrant abdominal pain with a mixed cholestatic/ hepatocellular enzyme pattern.  Prior ultrasound and subsequent MRI confirmed a markedly dilated biliary tree;  status postcholecystectomy (years ago) with acute tapering toward the ampulla.  She was sent to [facility];  seen by Dr. Alois, Biliary endoscopist.  He performed an EGD, EUS and therapeutic ERCP 9/3.  Cursory EGD was normal.  No obvious tumor on EUS.  Prominent appearing ampulla markedly dilated biliary tree with acute tapering at the ampulla confirmed.  Biliary sphincterotomy with biliary sphincterotomy dilation performed  -  plastic stents placed in both the pancreatic and bile ducts.  Biopsies of the ampulla negative for malignancy CA 19-9  (3) points above normal.  Patient states the above interventions at [facility] on 9/3 did not help with her pain which has been persistent.  In fact, 1 to 2 weeks ago she states pain,  nausea and vomiting became worse (8/10) to the point she could not stand it and presented to the ED last night.    She is scheduled to see Dr. Alois back on October 1.  Mother's history of pancreatic cancer noted.  Additional GI history significant for  colonoscopy earlier this year with adenoma removed-7-year interval recommended  EGD also on 6/26 demonstrated esophageal stricture which was dilated-dysphagia resolved.  GERD controlled with pantoprazole   Constipation controlled with Linzess     Past Medical History:  Diagnosis Date   Anxiety    Asthma    COPD (chronic obstructive pulmonary disease) (HCC)    Depression    GERD (gastroesophageal reflux disease)    Stroke (HCC)    left side weakness    Past Surgical History:  Procedure Laterality Date   BLADDER SURGERY     CHOLECYSTECTOMY     COLONOSCOPY N/A 06/07/2024   Procedure: COLONOSCOPY;  Surgeon: Cinderella Deatrice FALCON, MD;  Location: [facility];  Service: Endoscopy;  Laterality: N/A;  1:15 pm, asa 1/2   ESOPHAGOGASTRODUODENOSCOPY N/A 04/10/2014   Procedure: ESOPHAGOGASTRODUODENOSCOPY (EGD);  Surgeon: Claudis RAYMOND Rivet, MD;  Location: [facility];  Service: Endoscopy;  Laterality: N/A;   ESOPHAGOGASTRODUODENOSCOPY N/A 06/07/2024   Procedure: EGD (ESOPHAGOGASTRODUODENOSCOPY);  Surgeon: Cinderella Deatrice FALCON, MD;  Location: [facility];  Service: Endoscopy;  Laterality: N/A;    Prior to Admission medications   Medication Sig Start Date End Date Taking? Authorizing Provider  albuterol  (VENTOLIN  HFA) 108 (90 Base) MCG/ACT inhaler Inhale 2 puffs into the lungs every 6 (six) hours as needed for wheezing or shortness of breath. 09/29/21  Yes Comer Kirsch, PA-C  ALPRAZolam  (XANAX ) 0.5 MG tablet Take 0.5 mg by mouth 2 (two) times daily.   Yes [provider]  dicyclomine  (BENTYL ) 20 MG tablet Take 0.5 tablets (10 mg total) by mouth 3 (three) times daily before  meals. 06/07/24  Yes Ahmed, Deatrice FALCON, MD  DULoxetine  (CYMBALTA ) 60 MG capsule Take 60 mg by mouth daily.   Yes [provider]  linaclotide  (LINZESS ) 145 MCG CAPS capsule Take 1 capsule (145 mcg total) by mouth daily. 07/17/24 01/13/25 Yes Ahmed, Deatrice FALCON, MD  pantoprazole  (PROTONIX ) 40 MG tablet Take 1 tablet  (40 mg total) by mouth daily. 05/09/24  Yes Ahmed, Muhammad F, MD  SUBOXONE 8-2 MG FILM Place 1 Film under the tongue every 8 (eight) hours. 08/29/24  Yes [provider]    Current Facility-Administered Medications  Medication Dose Route Frequency Provider Last Rate Last Admin   albuterol  (PROVENTIL ) (2.5 MG/3ML) 0.083% nebulizer solution 2.5 mg  2.5 mg Inhalation Q6H PRN Elgergawy, Dawood S, MD       ALPRAZolam  (XANAX ) tablet 0.5 mg  0.5 mg Oral BID Elgergawy, Dawood S, MD   0.5 mg at 09/08/24 2110   buprenorphine-naloxone (SUBOXONE) 8-2 mg per SL tablet 1 tablet  1 tablet Sublingual Daily Elgergawy, Brayton RAMAN, MD       dicyclomine  (BENTYL ) capsule 10 mg  10 mg Oral TID AC Elgergawy, Dawood S, MD   10 mg at 09/09/24 9170   DULoxetine  (CYMBALTA ) DR capsule 60 mg  60 mg Oral Daily Elgergawy, Dawood S, MD   60 mg at 09/08/24 2110   heparin injection 5,000 Units  5,000 Units Subcutaneous Q8H Elgergawy, Brayton RAMAN, MD       HYDROmorphone  (DILAUDID ) injection 1 mg  1 mg Intravenous Q4H PRN Shahmehdi, Seyed A, MD       lactated ringers  infusion   Intravenous Continuous Shahmehdi, Seyed A, MD 150 mL/hr at 09/09/24 0830 Rate Change at 09/09/24 0830   linaclotide  (LINZESS ) capsule 145 mcg  145 mcg Oral Daily Elgergawy, Dawood S, MD   145 mcg at 09/08/24 2110   metoCLOPramide (REGLAN) injection 5 mg  5 mg Intravenous Q8H Shahmehdi, Seyed A, MD       ondansetron  (ZOFRAN ) tablet 4 mg  4 mg Oral Q6H PRN Elgergawy, Dawood S, MD       Or   ondansetron  (ZOFRAN ) injection 4 mg  4 mg Intravenous Q6H PRN Elgergawy, Dawood S, MD   4 mg at 09/09/24 9170   oxyCODONE  (Oxy IR/ROXICODONE ) immediate release tablet 5 mg  5 mg Oral Q4H PRN Elgergawy, Dawood S, MD   5 mg at 09/09/24 0515   pantoprazole  (PROTONIX ) EC tablet 40 mg  40 mg Oral Daily Elgergawy, Dawood S, MD   40 mg at 09/08/24 2110    Allergies as of 09/08/2024   (No Known Allergies)    Family History  Problem Relation Age of Onset   Cancer Mother     Pancreatic cancer Mother    Stroke Mother    Seizures Mother    Diabetes Father    Hypertension Father     Social History   Socioeconomic History   Marital status: Widowed    Spouse name: Not on file   Number of children: Not on file   Years of education: Not on file   Highest education level: Not on file  Occupational History   Not on file  Tobacco Use   Smoking status: Every Day    Current packs/day: 1.00    Average packs/day: 1 pack/day for 13.0 years (13.0 ttl pk-yrs)    Types: Cigarettes   Smokeless tobacco: Never  Vaping Use   Vaping status: Never Used  Substance and Sexual Activity   Alcohol use: No  Drug use: Not Currently    Comment: no MJ since age 64 per pt but + UDS 2008   Sexual activity: Not Currently    Birth control/protection: Condom  Other Topics Concern   Not on file  Social History Narrative   Not on file   Social Drivers of Health   Financial Resource Strain: Not on file  Food Insecurity: Patient Declined (09/08/2024)   Hunger Vital Sign    Worried About Running Out of Food in the Last Year: Patient declined    Ran Out of Food in the Last Year: Patient declined  Transportation Needs: Patient Declined (09/08/2024)   PRAPARE - Administrator, Civil Service (Medical): Patient declined    Lack of Transportation (Non-Medical): Patient declined  Physical Activity: Not on file  Stress: Not on file  Social Connections: Not on file  Intimate Partner Violence: Patient Declined (09/08/2024)   Humiliation, Afraid, Rape, and Kick questionnaire    Fear of Current or Ex-Partner: Patient declined    Emotionally Abused: Patient declined    Physically Abused: Patient declined    Sexually Abused: Patient declined    Review of Systems: As in HPI  Physical Exam: Vital signs in last 24 hours: Temp:  [98.2 F (36.8 C)-99.4 F (37.4 C)] 98.2 F (36.8 C) (09/28 0412) Pulse Rate:  [56-63] 56 (09/28 0412) Resp:  [10-18] 10 (09/28 0412) BP:  (125-142)/(63-78) 141/78 (09/28 0412) SpO2:  [95 %-100 %] 96 % (09/28 0412) Weight:  [50.3 kg-54 kg] 50.3 kg (09/27 2103) Last BM Date : 09/09/24 General:   Alert, uncomfortable and anxious appearing.  Pleasant and cooperative; accompanied by her fianc  eyes:  Sclera clear, no icterus.   Conjunctiva pink. Lungs:  Clear throughout to auscultation.   No wheezes, crackles, or rhonchi. No acute distress. Heart:  Regular rate and rhythm; no murmurs, clicks, rubs,  or gallops. Abdomen: Nondistended.  Bowel sounds somewhat and active notable epigastric and medial right upper quadrant tenderness to palpation with guarding.  No obvious mass  Intake/Output from previous day: 09/27 0701 - 09/28 0700 In: 872.8 [I.V.:872.8] Out: -  Intake/Output this shift: No intake/output data recorded.  Lab Results: Recent Labs    09/08/24 1538 09/09/24 0441  WBC 11.9* 14.1*  HGB 12.6 11.0*  HCT 39.9 34.3*  PLT 472* 408*   BMET Recent Labs    09/08/24 1538 09/09/24 0441  NA 141 140  K 4.3 3.5  CL 102 106  CO2 25 24  GLUCOSE 124* 108*  BUN 11 11  CREATININE 0.70 0.72  CALCIUM 10.0 8.6*   LFT Recent Labs    09/09/24 0441  PROT 6.8  ALBUMIN 3.6  AST 17  ALT 11  ALKPHOS 68  BILITOT 0.9  BILIDIR <0.1  IBILI NOT CALCULATED   PT/INR No results for input(s): LABPROT, INR in the last 72 hours. Hepatitis Panel No results for input(s): HEPBSAG, HCVAB, HEPAIGM, HEPBIGM in the last 72 hours. C-Diff No results for input(s): CDIFFTOX in the last 72 hours.  Studies/Results: CT ABDOMEN PELVIS W CONTRAST Result Date: 09/08/2024 EXAM: CT ABDOMEN AND PELVIS WITH CONTRAST 09/08/2024 05:49:04 PM TECHNIQUE: CT of the abdomen and pelvis was performed with the administration of 100 mL of iohexol  (OMNIPAQUE ) 300 MG/ML solution. Multiplanar reformatted images are provided for review. Automated exposure control, iterative reconstruction, and/or weight-based adjustment of the mA/kV was utilized  to reduce the radiation dose to as low as reasonably achievable. COMPARISON: CT 09/04/2021 and MRI abdomen 08/31/2024. CLINICAL  HISTORY: Abdominal pain, acute, nonlocalized; Abdominal pain, post-op; Pancreatitis, acute, severe. Abd pain, acute, nonlocalized; Post-op, pancreatitis, acute, severe; Omnipaque  300; 100 cc ; No PO per ordering MD; Pt called EMS because she was having severe abd pain. States that she can't stop throwing up. VS within normal limits. Rebound tenderness RUQ. FINDINGS: LOWER CHEST: No acute abnormality. LIVER: Pneumobilia. GALLBLADDER AND BILE DUCTS: Cholecystectomy. Similar dilation of the common bile duct measuring up to 17 mm in diameter. Interval placement of a biliary stent with pigtails in the common bile duct and distal duodenum. Mucosal Hyperenhancement and wall thickening of the common bile duct may be related to recent stent placement or cholangitis. SPLEEN: No acute abnormality. PANCREAS: No pancreatic ductal dilation. No peripancreatic inflammatory stranding or fluid. ADRENAL GLANDS: No acute abnormality. KIDNEYS, URETERS AND BLADDER: No stones in the kidneys or ureters. No hydronephrosis. No perinephric or periureteral stranding. Urinary bladder is unremarkable. GI AND BOWEL: Stomach demonstrates no acute abnormality. 5.8 cm pigtail stent in the descending colon. Question translocation of a previous biliary stent. Correlate with history. History. There is no bowel obstruction. PERITONEUM AND RETROPERITONEUM: Small volume free fluid in the pelvis. No intraabdominal or pelvic abscess. No free intraperitoneal air. VASCULATURE: Aorta is normal in caliber. LYMPH NODES: No lymphadenopathy. REPRODUCTIVE ORGANS: No acute abnormality. BONES AND SOFT TISSUES: No acute osseous abnormality. No focal soft tissue abnormality. IMPRESSION: 1. Interval placement of a biliary stent with pigtails in the common bile duct and distal duodenum; mucosal hyperenhancement and wall thickening of the dilated  common bile duct may reflect post-stent changes or cholangitis. 2. 5.8 cm pigtail stent in the descending colon, which may represent migration of a prior biliary stent; correlate with history. Electronically signed by: Norman Gatlin MD 09/08/2024 06:23 PM EDT RP Workstation: [id number]   Impression: Pleasant 48 year old lady admitted to the hospital with what appears to be acute pancreatitis.  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  She now has normal LFTs and pneumobilia which makes associated cholangitis much, much less likely.  Leukocytosis likely reactive to pancreatitis.  This lady has abdominal pain which have been present for couple of months; did not improve immediately following biliary intervention.  In fact, she states it persisted and got much worse in the last 1 to 2 weeks.  Interestingly, the pancreatic duct stent has migrated out of the  PD and now is in the colon.  Prominent biliary sphincter noted, biopsies negative.  CA 19-9 about 3 points above normal.  No obvious malignancy with the EUS/ERCP findings.  Worsening of her abdominal pain seems to be possibly temporally related to migration of the pancreatic stent.  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  At this time, her pancreatitis appears to be mild without complication.  Given slightly elevated CA 19-9 and a positive family history of pancreatic cancer continued vigilance is warranted.   Recommendations:  - Continue traditional treatment for pancreatitis i.e. n.p.o., IV fluids antiemetics and pain management  -Reassess tomorrow morning.  Update Dr. Alois with clinical status.  She has an appoint with October 1.  -Further recommendations to  follow.        Notice:  This dictation was prepared with Dragon dictation along with smaller phrase technology. Any transcriptional errors that result from this process are unintentional and may not be  corrected upon review.

## 2024-09-09 NOTE — Progress Notes (Signed)
   09/09/24 0920  TOC Brief Assessment  Insurance and Status Reviewed  Patient has primary care physician Yes DAVID, XAJE A)  Home environment has been reviewed From home.  Prior level of function: Independent, chronic medical management.  Prior/Current Home Services No current home services  Social Drivers of Health Review SDOH reviewed interventions complete;SDOH reviewed needs interventions  Readmission risk has been reviewed Yes  Transition of care needs no transition of care needs at this time

## 2024-09-09 NOTE — Plan of Care (Signed)

## 2024-09-09 NOTE — Progress Notes (Signed)
 PROGRESS NOTE    Patient: [name]                            PCP: Orpha Yancey LABOR, MD                    DOB: Apr 15, 1976            DOA: 09/08/2024 FMW:[id number]             DOS: 09/09/2024, 1:26 PM   LOS: 0 days   Date of Service: The patient was seen and examined on 09/09/2024  Subjective:   The patient was seen and examined this morning. Hemodynamically stable. Continue complain of epigastric and bilateral upper quadrant tenderness Improved nausea vomiting   Brief Narrative:   [name]  is a 48 y.o. female, with past medical history of COPD, GERD,.  Stricture, status post recent ERCP/EUS with biliary duct stent and pancreatic duct stent at [facility], EUS biopsy negative for malignancy, patient with intermittent symptoms of abdominal pain and nausea, but over the last 48 hours she has been having significant abdominal pain, nausea, vomiting, no fever, no chills, which prompted her to come to ED ED:   LFTs abnormalities, workup significant for elevated lipase, CT abdomen pelvis with patent biliary stent, and pancreatic duct stent appears to be migrated in the colon, she is with significant pain, nausea.     Assessment & Plan:   Principal Problem:   Acute pancreatitis Active Problems:   Abdominal pain   Nausea and vomiting in adult   Duodenal papillary stenosis  Abdominal pain/nausea/vomiting w  Acute pancreatitis -Still complaining abdominal pain but improved with pain medication, nausea vomiting improved - Remain n.p.o..SABRA Will start clear as tolerated - IV fluids - As needed IV analgesics, antiemetics - Labs; lipase 348, 311,   -Significant for acute pancreatitis with elevated lipase level, recent pancreatic duct stent has migrated currently appears to be in the colon, as discussed with GI, pancreatic duct stent migration is expected post this procedure.  -Gastroenterologist consulted Dr. Shaaron recommending continue supportive measures No intervention at this  point  Patient is to follow-up with her primary gastroenterologist Dr. Marella October 1 after discharge   History of bile duct stenosis/stricture - Post recent ERCP/EUS, with pigtail bile duct stent placement/pancreatic duct placement - EUS biopsy with no evidence of malignancy - Bile duct stent appears to be with no issues on imaging, LFTs within normal limit, does not appear to be with any acute issues - Patient scheduled for follow-up at [facility] in October 1st     Latest Ref Rng & Units 09/09/2024    4:41 AM 09/08/2024    3:38 PM 09/30/2021    1:01 PM  Hepatic Function  Total Protein 6.5 - 8.1 g/dL 6.8  7.8  7.7   Albumin 3.5 - 5.0 g/dL 3.6  4.3  4.6   AST 15 - 41 U/L 17  23  17    ALT 0 - 44 U/L 11  14  13    Alk Phosphatase 38 - 126 U/L 68  81  60   Total Bilirubin 0.0 - 1.2 mg/dL 0.9  0.6  0.7   Bilirubin, Direct 0.0 - 0.2 mg/dL <9.8          Anxiety - Continue with home dose Xanax , and Cymbalta . - she appears to be on Suboxone as an outpatient and currently being tapered off   GERD -Continue with  PPIDVT Prophylaxis Heparin        ----------------------------------------------------------------------------------------------------------------------------------------------- Nutritional status:  The patient's BMI is: Body mass index is 24.02 kg/m. I agree with the assessment and plan as outlined  Nutrition Status:         ----------------------------------------------------------------------------------------------------------------------------  DVT prophylaxis:  heparin injection 5,000 Units Start: 09/09/24 0600   Code Status:   Code Status: Full Code  Family Communication: No family member present at bedside-  -Advance care planning has been discussed.   Admission status:   Status is: Observation The patient remains OBS appropriate and will d/c before 2 midnights.   Disposition: From  - home             Planning for discharge in 1-2 days    Procedures:   No admission procedures for hospital encounter.   Antimicrobials:  Anti-infectives (From admission, onward)    None        Medication:   ALPRAZolam   0.5 mg Oral BID   buprenorphine-naloxone  1 tablet Sublingual Daily   dicyclomine   10 mg Oral TID AC   DULoxetine   60 mg Oral Daily   heparin  5,000 Units Subcutaneous Q8H   linaclotide   145 mcg Oral Daily   metoCLOPramide (REGLAN) injection  5 mg Intravenous Q8H   pantoprazole   40 mg Oral Daily    albuterol , HYDROmorphone  (DILAUDID ) injection, ondansetron  **OR** ondansetron  (ZOFRAN ) IV, oxyCODONE    Objective:   Vitals:   09/08/24 2004 09/08/24 2103 09/09/24 0043 09/09/24 0412  BP: 127/63 129/72 125/68 (!) 141/78  Pulse: 62 63 63 (!) 56  Resp: 18 11 16 10   Temp: 99.4 F (37.4 C) 98.9 F (37.2 C) 98.3 F (36.8 C) 98.2 F (36.8 C)  TempSrc: Oral Oral Oral Oral  SpO2: 98% 97% 95% 96%  Weight:  50.3 kg    Height:        Intake/Output Summary (Last 24 hours) at 09/09/2024 1326 Last data filed at 09/09/2024 0600 Gross per 24 hour  Intake 872.76 ml  Output --  Net 872.76 ml   Filed Weights   09/08/24 1524 09/08/24 2103  Weight: 54 kg 50.3 kg     Physical examination:   General:  AAO x 3,  cooperative, no distress;   HEENT:  Normocephalic, PERRL, otherwise with in Normal limits   Neuro:  CNII-XII intact. , normal motor and sensation, reflexes intact   Lungs:   Clear to auscultation BL, Respirations unlabored,  No wheezes / crackles  Cardio:    S1/S2, RRR, No murmure, No Rubs or Gallops   Abdomen:  Soft, bilateral upper quadrant, epigastric tenderness, bowel sounds active all four quadrants, no guarding or peritoneal signs.  Muscular  skeletal:  Limited exam -global generalized weaknesses - in bed, able to move all 4 extremities,   2+ pulses,  symmetric, No pitting edema  Skin:  Dry, warm to touch, negative for any Rashes,  Wounds: Please see nursing documentation        ------------------------------------------------------------------------------------------------------------------------------------------    LABs:     Latest Ref Rng & Units 09/09/2024    4:41 AM 09/08/2024    3:38 PM 09/30/2021    1:01 PM  CBC  WBC 4.0 - 10.5 K/uL 14.1  11.9  7.4   Hemoglobin 12.0 - 15.0 g/dL 88.9  87.3  88.1   Hematocrit 36.0 - 46.0 % 34.3  39.9  39.2   Platelets 150 - 400 K/uL 408  472  403       Latest Ref Rng &  Units 09/09/2024    4:41 AM 09/08/2024    3:38 PM 09/30/2021    1:01 PM  CMP  Glucose 70 - 99 mg/dL 891  875  82   BUN 6 - 20 mg/dL 11  11  13    Creatinine 0.44 - 1.00 mg/dL 9.27  9.29  9.02   Sodium 135 - 145 mmol/L 140  141  136   Potassium 3.5 - 5.1 mmol/L 3.5  4.3  3.9   Chloride 98 - 111 mmol/L 106  102  98   CO2 22 - 32 mmol/L 24  25  30    Calcium 8.9 - 10.3 mg/dL 8.6  89.9  9.1   Total Protein 6.5 - 8.1 g/dL 6.8  7.8  7.7   Total Bilirubin 0.0 - 1.2 mg/dL 0.9  0.6  0.7   Alkaline Phos 38 - 126 U/L 68  81  60   AST 15 - 41 U/L 17  23  17    ALT 0 - 44 U/L 11  14  13         Micro Results No results found for this or any previous visit (from the past 240 hours).  Radiology Reports CT ABDOMEN PELVIS W CONTRAST Result Date: 09/08/2024 EXAM: CT ABDOMEN AND PELVIS WITH CONTRAST 09/08/2024 05:49:04 PM TECHNIQUE: CT of the abdomen and pelvis was performed with the administration of 100 mL of iohexol  (OMNIPAQUE ) 300 MG/ML solution. Multiplanar reformatted images are provided for review. Automated exposure control, iterative reconstruction, and/or weight-based adjustment of the mA/kV was utilized to reduce the radiation dose to as low as reasonably achievable. COMPARISON: CT 09/04/2021 and MRI abdomen 08/31/2024. CLINICAL HISTORY: Abdominal pain, acute, nonlocalized; Abdominal pain, post-op; Pancreatitis, acute, severe. Abd pain, acute, nonlocalized; Post-op, pancreatitis, acute, severe; Omnipaque  300; 100 cc ; No PO per ordering MD; Pt called  EMS because she was having severe abd pain. States that she can't stop throwing up. VS within normal limits. Rebound tenderness RUQ. FINDINGS: LOWER CHEST: No acute abnormality. LIVER: Pneumobilia. GALLBLADDER AND BILE DUCTS: Cholecystectomy. Similar dilation of the common bile duct measuring up to 17 mm in diameter. Interval placement of a biliary stent with pigtails in the common bile duct and distal duodenum. Mucosal Hyperenhancement and wall thickening of the common bile duct may be related to recent stent placement or cholangitis. SPLEEN: No acute abnormality. PANCREAS: No pancreatic ductal dilation. No peripancreatic inflammatory stranding or fluid. ADRENAL GLANDS: No acute abnormality. KIDNEYS, URETERS AND BLADDER: No stones in the kidneys or ureters. No hydronephrosis. No perinephric or periureteral stranding. Urinary bladder is unremarkable. GI AND BOWEL: Stomach demonstrates no acute abnormality. 5.8 cm pigtail stent in the descending colon. Question translocation of a previous biliary stent. Correlate with history. History. There is no bowel obstruction. PERITONEUM AND RETROPERITONEUM: Small volume free fluid in the pelvis. No intraabdominal or pelvic abscess. No free intraperitoneal air. VASCULATURE: Aorta is normal in caliber. LYMPH NODES: No lymphadenopathy. REPRODUCTIVE ORGANS: No acute abnormality. BONES AND SOFT TISSUES: No acute osseous abnormality. No focal soft tissue abnormality. IMPRESSION: 1. Interval placement of a biliary stent with pigtails in the common bile duct and distal duodenum; mucosal hyperenhancement and wall thickening of the dilated common bile duct may reflect post-stent changes or cholangitis. 2. 5.8 cm pigtail stent in the descending colon, which may represent migration of a prior biliary stent; correlate with history. Electronically signed by: Norman Gatlin MD 09/08/2024 06:23 PM EDT RP Workstation: [id number]    SIGNED: Adriana DELENA Grams, MD, FHM. FAAFP. [facility] -  [facility] Time spent - 55 min.  In seeing, evaluating and examining the patient. Reviewing medical records, labs, drawn plan of care. [facility],  Pager (please use [email or web address] to page/ text) Please use Epic Secure Chat for non-urgent communication (7AM-7PM)  If 7PM-7AM, please contact night-coverage [email or web address], 09/09/2024, 1:26 PM

## 2024-09-10 DIAGNOSIS — R112 Nausea with vomiting, unspecified: Secondary | ICD-10-CM | POA: Diagnosis not present

## 2024-09-10 DIAGNOSIS — R1013 Epigastric pain: Secondary | ICD-10-CM

## 2024-09-10 DIAGNOSIS — K315 Obstruction of duodenum: Secondary | ICD-10-CM | POA: Diagnosis not present

## 2024-09-10 DIAGNOSIS — R198 Other specified symptoms and signs involving the digestive system and abdomen: Secondary | ICD-10-CM | POA: Diagnosis not present

## 2024-09-10 DIAGNOSIS — K858 Other acute pancreatitis without necrosis or infection: Secondary | ICD-10-CM | POA: Diagnosis not present

## 2024-09-10 LAB — COMPREHENSIVE METABOLIC PANEL WITH GFR
ALT: 11 U/L (ref 0–44)
AST: 18 U/L (ref 15–41)
Albumin: 3.7 g/dL (ref 3.5–5.0)
Alkaline Phosphatase: 73 U/L (ref 38–126)
Anion gap: 10 (ref 5–15)
BUN: 13 mg/dL (ref 6–20)
CO2: 26 mmol/L (ref 22–32)
Calcium: 8.7 mg/dL — ABNORMAL LOW (ref 8.9–10.3)
Chloride: 102 mmol/L (ref 98–111)
Creatinine, Ser: 0.63 mg/dL (ref 0.44–1.00)
GFR, Estimated: >60 mL/min (ref >60)
Glucose, Bld: 73 mg/dL (ref 70–99)
Potassium: 3.2 mmol/L — ABNORMAL LOW (ref 3.5–5.1)
Sodium: 138 mmol/L (ref 135–145)
Total Bilirubin: 1.2 mg/dL (ref 0.0–1.2)
Total Protein: 6.7 g/dL (ref 6.5–8.1)

## 2024-09-10 LAB — CBC
HCT: 33.3 % — ABNORMAL LOW (ref 36.0–46.0)
Hemoglobin: 10.6 g/dL — ABNORMAL LOW (ref 12.0–15.0)
MCH: 20.3 pg — ABNORMAL LOW (ref 26.0–34.0)
MCHC: 31.8 g/dL (ref 30.0–36.0)
MCV: 63.9 fL — ABNORMAL LOW (ref 80.0–100.0)
Platelets: 335 K/uL (ref 150–400)
RBC: 5.21 MIL/uL — ABNORMAL HIGH (ref 3.87–5.11)
RDW: 16.1 % — ABNORMAL HIGH (ref 11.5–15.5)
WBC: 11.4 K/uL — ABNORMAL HIGH (ref 4.0–10.5)
nRBC: 0 % (ref 0.0–0.2)

## 2024-09-10 LAB — LIPID PANEL
Cholesterol: 209 mg/dL — ABNORMAL HIGH (ref 0–200)
HDL: 42 mg/dL (ref >40)
LDL Cholesterol: 139 mg/dL — ABNORMAL HIGH (ref 0–99)
Total CHOL/HDL Ratio: 5 ratio
Triglycerides: 138 mg/dL (ref <150)
VLDL: 28 mg/dL (ref 0–40)

## 2024-09-10 LAB — LIPASE, BLOOD: Lipase: 164 U/L — ABNORMAL HIGH (ref 11–51)

## 2024-09-10 NOTE — Progress Notes (Cosign Needed Addendum)
 Gastroenterology Progress Note   Primary Gastroenterologist:  Dr. Cinderella  Patient ID: [name] Staff; [id number]; 12-30-1975    Subjective   Notes abdominal pain in RUQ and epigastric, which she states is chronic and was present prior to EUS/ERCP. Only change in her symptoms was refractory N/V, leading her to present to the ED. N/V has resolved. Pain unchanged from prior. She would like to advance diet.   Notes history of constipation recently and was given Linzess . Feels this is too strong as has multiple stools per day. Sometimes has alternating constipation and diarrhea. Stools are greasy-appearing at times.Rare ibuprofen.   Denies any fever/chills.    Objective   Vital signs in last 24 hours Temp:  [98.3 F (36.8 C)-99.2 F (37.3 C)] 99.2 F (37.3 C) (09/29 0427) Pulse Rate:  [56-58] 58 (09/29 0427) Resp:  [18-20] 18 (09/29 0427) BP: (116-127)/(58-59) 127/59 (09/29 0427) SpO2:  [97 %-99 %] 97 % (09/29 0427) Last BM Date : 09/09/24  Physical Exam General:   Alert and oriented, pleasant Head:  Normocephalic and atraumatic. Abdomen:  Bowel sounds present, soft, TTP RUQ and epigastric without rebound or guarding Neurologic:  Alert and  oriented x4 Intake/Output from previous day: 09/28 0701 - 09/29 0700 In: 974.9 [I.V.:974.9] Out: -  Intake/Output this shift: No intake/output data recorded.  Lab Results  Recent Labs    09/08/24 1538 09/09/24 0441 09/10/24 0456  WBC 11.9* 14.1* 11.4*  HGB 12.6 11.0* 10.6*  HCT 39.9 34.3* 33.3*  PLT 472* 408* 335   BMET Recent Labs    09/08/24 1538 09/09/24 0441 09/10/24 0456  NA 141 140 138  K 4.3 3.5 3.2*  CL 102 106 102  CO2 25 24 26   GLUCOSE 124* 108* 73  BUN 11 11 13   CREATININE 0.70 0.72 0.63  CALCIUM 10.0 8.6* 8.7*   LFT Recent Labs    09/08/24 1538 09/09/24 0441 09/10/24 0456  PROT 7.8 6.8 6.7  ALBUMIN 4.3 3.6 3.7  AST 23 17 18   ALT 14 11 11   ALKPHOS 81 68 73  BILITOT 0.6 0.9 1.2  BILIDIR  --   <0.1  --   IBILI  --  NOT CALCULATED  --      Studies/Results CT ABDOMEN PELVIS W CONTRAST Result Date: 09/08/2024 EXAM: CT ABDOMEN AND PELVIS WITH CONTRAST 09/08/2024 05:49:04 PM TECHNIQUE: CT of the abdomen and pelvis was performed with the administration of 100 mL of iohexol  (OMNIPAQUE ) 300 MG/ML solution. Multiplanar reformatted images are provided for review. Automated exposure control, iterative reconstruction, and/or weight-based adjustment of the mA/kV was utilized to reduce the radiation dose to as low as reasonably achievable. COMPARISON: CT 09/04/2021 and MRI abdomen 08/31/2024. CLINICAL HISTORY: Abdominal pain, acute, nonlocalized; Abdominal pain, post-op; Pancreatitis, acute, severe. Abd pain, acute, nonlocalized; Post-op, pancreatitis, acute, severe; Omnipaque  300; 100 cc ; No PO per ordering MD; Pt called EMS because she was having severe abd pain. States that she can't stop throwing up. VS within normal limits. Rebound tenderness RUQ. FINDINGS: LOWER CHEST: No acute abnormality. LIVER: Pneumobilia. GALLBLADDER AND BILE DUCTS: Cholecystectomy. Similar dilation of the common bile duct measuring up to 17 mm in diameter. Interval placement of a biliary stent with pigtails in the common bile duct and distal duodenum. Mucosal Hyperenhancement and wall thickening of the common bile duct may be related to recent stent placement or cholangitis. SPLEEN: No acute abnormality. PANCREAS: No pancreatic ductal dilation. No peripancreatic inflammatory stranding or fluid. ADRENAL GLANDS: No acute abnormality. KIDNEYS, URETERS  AND BLADDER: No stones in the kidneys or ureters. No hydronephrosis. No perinephric or periureteral stranding. Urinary bladder is unremarkable. GI AND BOWEL: Stomach demonstrates no acute abnormality. 5.8 cm pigtail stent in the descending colon. Question translocation of a previous biliary stent. Correlate with history. History. There is no bowel obstruction. PERITONEUM AND  RETROPERITONEUM: Small volume free fluid in the pelvis. No intraabdominal or pelvic abscess. No free intraperitoneal air. VASCULATURE: Aorta is normal in caliber. LYMPH NODES: No lymphadenopathy. REPRODUCTIVE ORGANS: No acute abnormality. BONES AND SOFT TISSUES: No acute osseous abnormality. No focal soft tissue abnormality. IMPRESSION: 1. Interval placement of a biliary stent with pigtails in the common bile duct and distal duodenum; mucosal hyperenhancement and wall thickening of the dilated common bile duct may reflect post-stent changes or cholangitis. 2. 5.8 cm pigtail stent in the descending colon, which may represent migration of a prior biliary stent; correlate with history. Electronically signed by: Norman Gatlin MD 09/08/2024 06:23 PM EDT RP Workstation: [id number]    Assessment  48 y.o. female with a history of GERD, esophageal stricture s/p dilation, constipation, depression, COPD, prior stroke, FH pancreatic cancer in mother, abdominal pain, found to have dilated CBD with MRI revealing abrupt termination at ampulla, s/p ERCP/EUS with sphincterotomy, sphincteroplasty, ampullary biopsies, CBD and PD stent placement Sept 3, 2025 by Dr. Alois at [facility] and benign biopsy, presenting this admission with acute pancreatitis. CA 19-9 mildly elevated at 39 early Sept 2025.   Acute pancreatitis: CT imaging with mucosal hyper-enhancement and wall thickening of dilated CBD with differentials including post-stent effect or cholangitis, migration of PD stent to the colon.WBC count 11.9 on admission, max of 14.1, back down to 11.4 today. LFTs remaining normal. Lipase improved , [phone]. Less likely dealing with cholangitis at this time and more sequela of prior stenting. Remains afebrile, and her chronic abdominal pain is at baseline. Interestingly, N/V was only new symptom for her this admission, and this is improved with Reglan scheduled. Will advance diet to clear liquids. Will also order IgG 4 and  lipid panel.   Microcytic anemia: chronic. No overt GI bleeding. Colonoscopy/EGD on file.   Intermittent constipation and loose stools: suspect component of IBS. Numerous loose stools on Linzess  145 as outpatient. Will hold this and resume at lower dose tomorrow. Notably, she reports chronic history of looser stool but most recently has dealt with constipation. Difficult historian.    Plan / Recommendations  Clear liquid diet, advance as tolerated to soft foods.  Could consider pancreatic enzymes with diet advancement Hold Linzess , likely resume tomorrow lower dosing with Linzess  72 mcg  Reglan scheduled every 8 hours.  PPI daily I have reached out to Dr. Alois with update.     LOS: 1 day    09/10/2024, 9:13 AM  Nicole MICAEL Stager, PhD, ANP-BC [facility] G[facility]enter[facility]   Addendum: spoke with Dr. Alois. As 10/1 was repeat ERCP date, he will discuss with the nurse navigators and cancel 10/1 appt with date pushed out about 2-3 weeks. Patient to be informed on new date by [facility].  I have also discussed with patient's daughter. Patient aware.

## 2024-09-10 NOTE — Progress Notes (Signed)
 PROGRESS NOTE    Patient: [name] Staff                            PCP: Orpha Yancey LABOR, MD                    DOB: January 26, 1976            DOA: 09/08/2024 FMW:[id number]             DOS: 09/10/2024, 6:54 PM   LOS: 1 day   Date of Service: The patient was seen and examined on 09/10/2024  Subjective:  No nausea, no vomiting reported improvement in her abdominal pain.  Patient is afebrile and in no acute distress.   Brief Narrative:   Graceanne Guin  is a 48 y.o. female, with past medical history of COPD, GERD,.  Stricture, status post recent ERCP/EUS with biliary duct stent and pancreatic duct stent at [facility], EUS biopsy negative for malignancy, patient with intermittent symptoms of abdominal pain and nausea, but over the last 48 hours she has been having significant abdominal pain, nausea, vomiting, no fever, no chills, which prompted her to come to ED ED:   LFTs abnormalities, workup significant for elevated lipase, CT abdomen pelvis with patent biliary stent, and pancreatic duct stent appears to be migrated in the colon, she is with significant pain, nausea.     Assessment & Plan:   Principal Problem:   Acute pancreatitis Active Problems:   Abdominal pain   Nausea and vomiting in adult   Duodenal papillary stenosis  Abdominal pain/nausea/vomiting w  Acute pancreatitis -Still complaining abdominal pain but improved with pain medication, nausea vomiting improved - Planning to advance to clear liquid diet - Continue as needed analgesics and antiemetics; maintain adequate hydration. - Labs; lipase 348, 311, 164 - Patient reports no nausea, no vomiting and at the moment improvement in overall abdominal pain. - Appreciate assistance and recommendation by gastroenterology service. -Patient is to follow-up with her primary gastroenterologist Dr. Marella October 1 after discharge. -Patient might benefit of pancreatic enzymes   History of bile duct stenosis/stricture - Post recent  ERCP/EUS, with pigtail bile duct stent placement/pancreatic duct placement - EUS biopsy with no evidence of malignancy - Bile duct stent appears to be with no issues on imaging, LFTs within normal limit, does not appear to be with any acute issues - Patient scheduled for follow-up at [facility] in October 1st     Latest Ref Rng & Units 09/10/2024    4:56 AM 09/09/2024    4:41 AM 09/08/2024    3:38 PM  Hepatic Function  Total Protein 6.5 - 8.1 g/dL 6.7  6.8  7.8   Albumin 3.5 - 5.0 g/dL 3.7  3.6  4.3   AST 15 - 41 U/L 18  17  23    ALT 0 - 44 U/L 11  11  14    Alk Phosphatase 38 - 126 U/L 73  68  81   Total Bilirubin 0.0 - 1.2 mg/dL 1.2  0.9  0.6   Bilirubin, Direct 0.0 - 0.2 mg/dL  <9.8      Anxiety - Continue with home dose Xanax , and Cymbalta . - she appears to be on Suboxone as an outpatient and currently being tapered off   GERD - Continue PPI.     ----------------------------------------------------------------------------------------------------------------------------------------------- Nutritional status:  The patient's BMI is: Body mass index is 24.02 kg/m. I agree with the assessment and plan  as outlined  Nutrition Status: Diet has been Advanced to clear liquid        ----------------------------------------------------------------------------------------------------------------------------  DVT prophylaxis:  heparin injection 5,000 Units Start: 09/09/24 0600   Code Status:   Code Status: Full Code  Family Communication: No family member present at bedside-  -Advance care planning has been discussed.   Admission status:   Status is: Observation The patient remains OBS appropriate and will d/c before 2 midnights.   Disposition: From  - home             Planning for discharge in 1-2 days   Procedures:   No admission procedures for hospital encounter.   Antimicrobials:  Anti-infectives (From admission, onward)    None        Medication:    ALPRAZolam   0.5 mg Oral BID   buprenorphine-naloxone  1 tablet Sublingual Daily   dicyclomine   10 mg Oral TID AC   DULoxetine   60 mg Oral Daily   heparin  5,000 Units Subcutaneous Q8H   metoCLOPramide (REGLAN) injection  5 mg Intravenous Q8H   pantoprazole   40 mg Oral Daily    albuterol , HYDROmorphone  (DILAUDID ) injection, ondansetron  **OR** ondansetron  (ZOFRAN ) IV, oxyCODONE    Objective:   Vitals:   09/09/24 1413 09/09/24 2104 09/10/24 0427 09/10/24 1321  BP: (!) 116/59 (!) 121/58 (!) 127/59 136/65  Pulse: (!) 56 (!) 58 (!) 58 (!) 57  Resp: 20 20 18    Temp: 98.3 F (36.8 C) 98.4 F (36.9 C) 99.2 F (37.3 C) 98.2 F (36.8 C)  TempSrc: Oral Oral Oral Oral  SpO2: 97% 99% 97% 97%  Weight:      Height:        Intake/Output Summary (Last 24 hours) at 09/10/2024 1854 Last data filed at 09/10/2024 0400 Gross per 24 hour  Intake 0 ml  Output --  Net 0 ml   Filed Weights   09/08/24 1524 09/08/24 2103  Weight: 54 kg 50.3 kg     Physical examination:   General exam: Alert, awake, oriented x 3, no fever, no chest pain, no nausea, no vomiting.  Still reporting intermittent mid abdominal discomfort. Respiratory system: Clear to auscultation. Respiratory effort normal.  Good saturation on room air. Cardiovascular system:RRR. No murmurs, rubs, gallops. Gastrointestinal system: Abdomen is nondistended, without guarding demonstrating tenderness on palpation.  Positive bowel sounds appreciated. Central nervous system: No focal neurological deficits. Extremities: No cyanosis, clubbing or edema. Skin: No petechiae. Psychiatry: Judgement and insight appear normal. Mood & affect appropriate.   -----------------------------------------------------------------------------------------------------------------------------------------    LABs:     Latest Ref Rng & Units 09/10/2024    4:56 AM 09/09/2024    4:41 AM 09/08/2024    3:38 PM  CBC  WBC 4.0 - 10.5 K/uL 11.4  14.1  11.9    Hemoglobin 12.0 - 15.0 g/dL 89.3  88.9  87.3   Hematocrit 36.0 - 46.0 % 33.3  34.3  39.9   Platelets 150 - 400 K/uL 335  408  472       Latest Ref Rng & Units 09/10/2024    4:56 AM 09/09/2024    4:41 AM 09/08/2024    3:38 PM  CMP  Glucose 70 - 99 mg/dL 73  891  875   BUN 6 - 20 mg/dL 13  11  11    Creatinine 0.44 - 1.00 mg/dL 9.36  9.27  9.29   Sodium 135 - 145 mmol/L 138  140  141   Potassium 3.5 - 5.1 mmol/L 3.2  3.5  4.3   Chloride 98 - 111 mmol/L 102  106  102   CO2 22 - 32 mmol/L 26  24  25    Calcium 8.9 - 10.3 mg/dL 8.7  8.6  89.9   Total Protein 6.5 - 8.1 g/dL 6.7  6.8  7.8   Total Bilirubin 0.0 - 1.2 mg/dL 1.2  0.9  0.6   Alkaline Phos 38 - 126 U/L 73  68  81   AST 15 - 41 U/L 18  17  23    ALT 0 - 44 U/L 11  11  14      Micro Results No results found for this or any previous visit (from the past 240 hours).  Radiology Reports No results found.   SIGNED: Eric Nunnery, MD [facility] - [facility] Time spent - 55 min.  In seeing, evaluating and examining the patient. Reviewing medical records, labs, drawn plan of care. [facility],  Pager (please use [email or web address] to page/ text) Please use Epic Secure Chat for non-urgent communication (7AM-7PM)  If 7PM-7AM, please contact night-coverage [email or web address], 09/10/2024, 11:00am

## 2024-09-10 NOTE — Plan of Care (Signed)

## 2024-09-11 ENCOUNTER — Encounter (INDEPENDENT_AMBULATORY_CARE_PROVIDER_SITE_OTHER): Admitting: Gastroenterology

## 2024-09-11 ENCOUNTER — Telehealth: Payer: Self-pay | Admitting: Gastroenterology

## 2024-09-11 DIAGNOSIS — K859 Acute pancreatitis without necrosis or infection, unspecified: Principal | ICD-10-CM

## 2024-09-11 DIAGNOSIS — K315 Obstruction of duodenum: Secondary | ICD-10-CM | POA: Diagnosis not present

## 2024-09-11 DIAGNOSIS — R1013 Epigastric pain: Secondary | ICD-10-CM | POA: Diagnosis not present

## 2024-09-11 DIAGNOSIS — K858 Other acute pancreatitis without necrosis or infection: Secondary | ICD-10-CM | POA: Diagnosis not present

## 2024-09-11 DIAGNOSIS — R112 Nausea with vomiting, unspecified: Secondary | ICD-10-CM | POA: Diagnosis not present

## 2024-09-11 LAB — COMPREHENSIVE METABOLIC PANEL WITH GFR
ALT: 12 U/L (ref 0–44)
AST: 19 U/L (ref 15–41)
Albumin: 3.7 g/dL (ref 3.5–5.0)
Alkaline Phosphatase: 71 U/L (ref 38–126)
Anion gap: 9 (ref 5–15)
BUN: 11 mg/dL (ref 6–20)
CO2: 28 mmol/L (ref 22–32)
Calcium: 8.7 mg/dL — ABNORMAL LOW (ref 8.9–10.3)
Chloride: 100 mmol/L (ref 98–111)
Creatinine, Ser: 0.77 mg/dL (ref 0.44–1.00)
GFR, Estimated: >60 mL/min (ref >60)
Glucose, Bld: 104 mg/dL — ABNORMAL HIGH (ref 70–99)
Potassium: 3.1 mmol/L — ABNORMAL LOW (ref 3.5–5.1)
Sodium: 137 mmol/L (ref 135–145)
Total Bilirubin: 0.9 mg/dL (ref 0.0–1.2)
Total Protein: 6.7 g/dL (ref 6.5–8.1)

## 2024-09-11 LAB — CBC
HCT: 32.8 % — ABNORMAL LOW (ref 36.0–46.0)
Hemoglobin: 10.4 g/dL — ABNORMAL LOW (ref 12.0–15.0)
MCH: 20.2 pg — ABNORMAL LOW (ref 26.0–34.0)
MCHC: 31.7 g/dL (ref 30.0–36.0)
MCV: 63.7 fL — ABNORMAL LOW (ref 80.0–100.0)
Platelets: 317 K/uL (ref 150–400)
RBC: 5.15 MIL/uL — ABNORMAL HIGH (ref 3.87–5.11)
RDW: 15.9 % — ABNORMAL HIGH (ref 11.5–15.5)
WBC: 8.8 K/uL (ref 4.0–10.5)
nRBC: 0 % (ref 0.0–0.2)

## 2024-09-11 LAB — LIPASE, BLOOD: Lipase: 109 U/L — ABNORMAL HIGH (ref 11–51)

## 2024-09-11 MED ORDER — PANCRELIPASE (LIP-PROT-AMYL) 24000-76000 UNITS PO CPEP: 36000.0000 [IU] | ORAL_CAPSULE | Freq: Three times a day (TID) | ORAL | 2 refills | Status: DC

## 2024-09-11 MED ORDER — LINACLOTIDE 72 MCG PO CAPS
72.0000 ug | ORAL_CAPSULE | Freq: Every day | ORAL | Status: DC
  Administered 2024-09-11: 72 ug via ORAL
  Filled 2024-09-11 (×2): qty 1

## 2024-09-11 MED ORDER — LINACLOTIDE 72 MCG PO CAPS: 72.0000 ug | ORAL_CAPSULE | Freq: Every day | ORAL | 2 refills | Status: AC

## 2024-09-11 MED ORDER — BOOST HIGH PROTEIN PO LIQD: 1.0000 | Freq: Two times a day (BID) | ORAL | 2 refills | Status: AC

## 2024-09-11 MED ORDER — PANCRELIPASE (LIP-PROT-AMYL) 12000-38000 UNITS PO CPEP
24000.0000 [IU] | ORAL_CAPSULE | Freq: Three times a day (TID) | ORAL | Status: DC
  Administered 2024-09-11: 24000 [IU] via ORAL
  Filled 2024-09-11: qty 2

## 2024-09-11 MED ORDER — POTASSIUM CHLORIDE CRYS ER 20 MEQ PO TBCR
40.0000 meq | EXTENDED_RELEASE_TABLET | Freq: Once | ORAL | Status: AC
  Administered 2024-09-11: 40 meq via ORAL
  Filled 2024-09-11: qty 2

## 2024-09-11 NOTE — Progress Notes (Signed)
Patient discharged home with instructions given on medications and follow up visits,patient verbalized understanding. Prescriptions sent to Pharmacy of choice documented on AVS. IV discontinued, catheter intact. Accompanied by staff to an awaiting vehicle.

## 2024-09-11 NOTE — Telephone Encounter (Signed)
 Devere:  Please arrange hospital follow-up in 4 weeks with Dr. Cinderella or myself.   I also sent in boost prescription to [facility] pharmacy at patient/family request. Likely will not be covered by insurance as this is OTC, but we can try.

## 2024-09-11 NOTE — Plan of Care (Signed)
  Problem: Health Behavior/Discharge Planning: Goal: Ability to manage health-related needs will improve Outcome: Progressing   Problem: Clinical Measurements: Goal: Will remain free from infection Outcome: Progressing   Problem: Activity: Goal: Risk for activity intolerance will decrease Outcome: Progressing   Problem: Coping: Goal: Level of anxiety will decrease Outcome: Progressing   Problem: Elimination: Goal: Will not experience complications related to urinary retention Outcome: Progressing   Problem: Pain Managment: Goal: General experience of comfort will improve and/or be controlled Outcome: Progressing   Problem: Safety: Goal: Ability to remain free from injury will improve Outcome: Progressing   Problem: Skin Integrity: Goal: Risk for impaired skin integrity will decrease Outcome: Progressing

## 2024-09-11 NOTE — Progress Notes (Signed)
 Gastroenterology Progress Note   Primary Gastroenterologist:  Dr. Cinderella  Patient ID: [name] Staff; [id number]; 1976/06/27    Subjective   Abdominal pain improved and now at baseline. No N/V. Ate pancakes for breakfast. Eager to go home. ERCP is now rescheduled for 10/15. No diarrhea. No BM.    Objective   Vital signs in last 24 hours Temp:  [98.2 F (36.8 C)-98.7 F (37.1 C)] 98.7 F (37.1 C) (09/30 0938) Pulse Rate:  [55-58] 58 (09/30 0938) Resp:  [18-19] 19 (09/30 0938) BP: (112-136)/(62-76) 121/76 (09/30 0938) SpO2:  [96 %-97 %] 97 % (09/30 0938) Last BM Date : 09/09/24  Physical Exam General:   Alert and oriented, pleasant Head:  Normocephalic and atraumatic. Eyes:  No icterus, sclera clear. Conjuctiva pink.  Abdomen:  Bowel sounds present, soft, non-tender, non-distended. Neurologic:  Alert and  oriented x4;   Intake/Output from previous day: No intake/output data recorded. Intake/Output this shift: No intake/output data recorded.  Lab Results  Recent Labs    09/09/24 0441 09/10/24 0456 09/11/24 0435  WBC 14.1* 11.4* 8.8  HGB 11.0* 10.6* 10.4*  HCT 34.3* 33.3* 32.8*  PLT 408* 335 317   BMET Recent Labs    09/09/24 0441 09/10/24 0456 09/11/24 0435  NA 140 138 137  K 3.5 3.2* 3.1*  CL 106 102 100  CO2 24 26 28   GLUCOSE 108* 73 104*  BUN 11 13 11   CREATININE 0.72 0.63 0.77  CALCIUM 8.6* 8.7* 8.7*   LFT Recent Labs    09/09/24 0441 09/10/24 0456 09/11/24 0435  PROT 6.8 6.7 6.7  ALBUMIN 3.6 3.7 3.7  AST 17 18 19   ALT 11 11 12   ALKPHOS 68 73 71  BILITOT 0.9 1.2 0.9  BILIDIR <0.1  --   --   IBILI NOT CALCULATED  --   --      Studies/Results CT ABDOMEN PELVIS W CONTRAST Result Date: 09/08/2024 EXAM: CT ABDOMEN AND PELVIS WITH CONTRAST 09/08/2024 05:49:04 PM TECHNIQUE: CT of the abdomen and pelvis was performed with the administration of 100 mL of iohexol  (OMNIPAQUE ) 300 MG/ML solution. Multiplanar reformatted images are provided for  review. Automated exposure control, iterative reconstruction, and/or weight-based adjustment of the mA/kV was utilized to reduce the radiation dose to as low as reasonably achievable. COMPARISON: CT 09/04/2021 and MRI abdomen 08/31/2024. CLINICAL HISTORY: Abdominal pain, acute, nonlocalized; Abdominal pain, post-op; Pancreatitis, acute, severe. Abd pain, acute, nonlocalized; Post-op, pancreatitis, acute, severe; Omnipaque  300; 100 cc ; No PO per ordering MD; Pt called EMS because she was having severe abd pain. States that she can't stop throwing up. VS within normal limits. Rebound tenderness RUQ. FINDINGS: LOWER CHEST: No acute abnormality. LIVER: Pneumobilia. GALLBLADDER AND BILE DUCTS: Cholecystectomy. Similar dilation of the common bile duct measuring up to 17 mm in diameter. Interval placement of a biliary stent with pigtails in the common bile duct and distal duodenum. Mucosal Hyperenhancement and wall thickening of the common bile duct may be related to recent stent placement or cholangitis. SPLEEN: No acute abnormality. PANCREAS: No pancreatic ductal dilation. No peripancreatic inflammatory stranding or fluid. ADRENAL GLANDS: No acute abnormality. KIDNEYS, URETERS AND BLADDER: No stones in the kidneys or ureters. No hydronephrosis. No perinephric or periureteral stranding. Urinary bladder is unremarkable. GI AND BOWEL: Stomach demonstrates no acute abnormality. 5.8 cm pigtail stent in the descending colon. Question translocation of a previous biliary stent. Correlate with history. History. There is no bowel obstruction. PERITONEUM AND RETROPERITONEUM: Small volume free fluid in  the pelvis. No intraabdominal or pelvic abscess. No free intraperitoneal air. VASCULATURE: Aorta is normal in caliber. LYMPH NODES: No lymphadenopathy. REPRODUCTIVE ORGANS: No acute abnormality. BONES AND SOFT TISSUES: No acute osseous abnormality. No focal soft tissue abnormality. IMPRESSION: 1. Interval placement of a biliary stent  with pigtails in the common bile duct and distal duodenum; mucosal hyperenhancement and wall thickening of the dilated common bile duct may reflect post-stent changes or cholangitis. 2. 5.8 cm pigtail stent in the descending colon, which may represent migration of a prior biliary stent; correlate with history. Electronically signed by: Norman Gatlin MD 09/08/2024 06:23 PM EDT RP Workstation: [id number]    Assessment  48 y.o. female with a history of  GERD, esophageal stricture s/p dilation, constipation, depression, COPD, prior stroke, FH pancreatic cancer in mother, abdominal pain, found to have dilated CBD with MRI revealing abrupt termination at ampulla, s/p ERCP/EUS with sphincterotomy, sphincteroplasty, ampullary biopsies, CBD and PD stent placement Sept 3, 2025 by Dr. Alois at [facility] and benign biopsy, presenting this admission with acute pancreatitis. CA 19-9 mildly elevated at 39 early Sept 2025.    Acute pancreatitis: CT imaging with mucosal hyper-enhancement and wall thickening of dilated CBD with differentials including post-stent effect or cholangitis, migration of PD stent to the colon.WBC count 11.9 on admission, max of 14.1, now normalized.  LFTs remaining normal. Lipase improved , 348>311>164, pending lipase today. Clinically, she has improved from pancreatitis standpoint with supportive measures and back to her baseline. Tolerating diet as well. Does not appear to have any signs of cholangitis. IgG4 is pending and lipid panel overall unimpressive. Clinically stable for discharge home today.    Microcytic anemia: chronic. No overt GI bleeding. Colonoscopy/EGD on file. Hgb stable over past 24 hours.    Intermittent constipation and loose stools: suspect component of IBS. Numerous loose stools on Linzess  145 as outpatient. No BM today. Will resume Linzess  but at lower dose of 72 mcg daily.  With chronic abdominal pain and family noted prior pancreatitis, can also empirically add  pancreatic enzymes. Monitor for any constipation. Weight based would be Creon 36k capsules, taking 2 with meals and 1 with snacks. Zenpep dosing would be best at 60k.   Hypokalemia: replaced per hospitalist.  Family requesting boost prescription sent to pharmacy, and I will send to [facility].    Plan / Recommendations  Appropriate for discharge home Can start Creon 36k units, take 2 capsules with food and 1 with snacks, monitor for an constipation.  Boost/ensure prescription sent to [facility] per family request Linzess  72 mcg daily Keep upcoming appt with Dr Jahann for repeat ERCP; patient states this has been rescheduled to 10/15 Will arrange outpatient GI follow-up    LOS: 2 days    09/11/2024, 9:53 AM  Therisa MICAEL Stager, PhD, ANP-BC [facility]   ;

## 2024-09-11 NOTE — Discharge Summary (Signed)
 Physician Discharge Summary   Patient: [name] MRN: [id number] DOB: Aug 13, 1976  Admit date:     09/08/2024  Discharge date: 09/11/24  Discharge Physician: Eric Nunnery   PCP: Orpha Yancey LABOR, MD   Recommendations at discharge:  Repeat complete metabolic panel to follow electrolytes, renal function and LFTs Repeat CBC to follow hemoglobin trend/stability. Make sure patient follow-up with gastroenterology service as instructed. Reassess response to the initiation of creon and improvement of her GI  symptoms.  Discharge Diagnoses: Principal Problem:   Acute pancreatitis Active Problems:   Abdominal pain   Nausea and vomiting in adult   Duodenal papillary stenosis  Brief Hospital admission narrative: [name]  is a 48 y.o. female, with past medical history of COPD, GERD,.  Stricture, status post recent ERCP/EUS with biliary duct stent and pancreatic duct stent at [facility], EUS biopsy negative for malignancy, patient with intermittent symptoms of abdominal pain and nausea, but over the last 48 hours she has been having significant abdominal pain, nausea, vomiting, no fever, no chills, which prompted her to come to ED ED:   LFTs abnormalities, workup significant for elevated lipase, CT abdomen pelvis with patent biliary stent, and pancreatic duct stent appears to be migrated in the colon, she is with significant pain, nausea.   Assessment and Plan: Abdominal pain/nausea/vomiting w  Acute pancreatitis -Still complaining abdominal pain but improved with pain medication, nausea vomiting improved - Diet advance and tolerated at discharge. - Continue as needed analgesics and antiemetics; maintain adequate hydration. - Labs; lipase 348, 311, 164, 109 - Patient reports no nausea, no vomiting and at the moment reporting significant improvement in overall abdominal pain. - Appreciate assistance and recommendation by gastroenterology service. -Patient is to follow-up with her primary  gastroenterologist Dr. Marella October 1 after discharge. -Patient will benefit of pancreatic enzymes (Creon 3 times a day started).   History of bile duct stenosis/stricture - Post recent ERCP/EUS, with pigtail bile duct stent placement/pancreatic duct placement - EUS biopsy with no evidence of malignancy - Bile duct stent appears to be with no issues on imaging, LFTs within normal limit, does not appear to be with any acute issues. -Continue patient with gastroenterology after discharge - Continue to follow LFTs trend - After discussing with GI plan will be to start patient on Creon.  Anxiety - Continue with home dose Xanax , and Cymbalta . - she appears to be on Suboxone as an outpatient and currently being tapered off   GERD - Continue PPI.   Consultants: Gastroenterology service Procedures performed: See below for x-ray reports. Disposition: Home Diet recommendation: Low-fat diet.  DISCHARGE MEDICATION: Allergies as of 09/11/2024   No Known Allergies      Medication List     TAKE these medications    albuterol  108 (90 Base) MCG/ACT inhaler Commonly known as: VENTOLIN  HFA Inhale 2 puffs into the lungs every 6 (six) hours as needed for wheezing or shortness of breath.   ALPRAZolam  0.5 MG tablet Commonly known as: XANAX  Take 0.5 mg by mouth 2 (two) times daily.   dicyclomine  20 MG tablet Commonly known as: BENTYL  Take 0.5 tablets (10 mg total) by mouth 3 (three) times daily before meals.   DULoxetine  60 MG capsule Commonly known as: CYMBALTA  Take 60 mg by mouth daily.   feeding supplement Liqd Take 237 mLs by mouth 2 (two) times daily between meals.   linaclotide  72 MCG capsule Commonly known as: LINZESS  Take 1 capsule (72 mcg total) by mouth daily before breakfast.  Start taking on: September 12, 2024 What changed:  medication strength how much to take when to take this   Pancrelipase (Lip-Prot-Amyl) 24000-76000 units Cpep Take 1.5 capsules (36,000 Units  total) by mouth 3 (three) times daily before meals.   pantoprazole  40 MG tablet Commonly known as: PROTONIX  Take 1 tablet (40 mg total) by mouth daily.   Suboxone 8-2 MG Film Generic drug: Buprenorphine HCl-Naloxone HCl Place 1 Film under the tongue every 8 (eight) hours.        Follow-up Information     Orpha Yancey LABOR, MD. Schedule an appointment as soon as possible for a visit in 2 week(s).   Specialty: Internal Medicine Contact information: [address] [place] [address] [phone]                Discharge Exam: Filed Weights   09/08/24 1524 09/08/24 2103  Weight: 54 kg 50.3 kg   General exam: Alert, awake, oriented x 3, no fever, no chest pain, no nausea, no vomiting.  Still reporting intermittent mid abdominal discomfort but significantly improved.  Tolerating diet and feeling ready to go home.SABRA Respiratory system: Clear to auscultation. Respiratory effort normal.  Good saturation on room air. Cardiovascular system:RRR. No murmurs, rubs, gallops. Gastrointestinal system: Abdomen is nondistended, without guarding demonstrating tenderness on palpation.  Positive bowel sounds appreciated. Central nervous system: No focal neurological deficits. Extremities: No cyanosis, clubbing or edema. Skin: No petechiae. Psychiatry: Judgement and insight appear normal. Mood & affect appropriate.   Condition at discharge: Stable and improved.  The results of significant diagnostics from this hospitalization (including imaging, microbiology, ancillary and laboratory) are listed below for reference.   Imaging Studies: CT ABDOMEN PELVIS W CONTRAST Result Date: 09/08/2024 EXAM: CT ABDOMEN AND PELVIS WITH CONTRAST 09/08/2024 05:49:04 PM TECHNIQUE: CT of the abdomen and pelvis was performed with the administration of 100 mL of iohexol  (OMNIPAQUE ) 300 MG/ML solution. Multiplanar reformatted images are provided for review. Automated exposure control, iterative reconstruction,  and/or weight-based adjustment of the mA/kV was utilized to reduce the radiation dose to as low as reasonably achievable. COMPARISON: CT 09/04/2021 and MRI abdomen 08/31/2024. CLINICAL HISTORY: Abdominal pain, acute, nonlocalized; Abdominal pain, post-op; Pancreatitis, acute, severe. Abd pain, acute, nonlocalized; Post-op, pancreatitis, acute, severe; Omnipaque  300; 100 cc ; No PO per ordering MD; Pt called EMS because she was having severe abd pain. States that she can't stop throwing up. VS within normal limits. Rebound tenderness RUQ. FINDINGS: LOWER CHEST: No acute abnormality. LIVER: Pneumobilia. GALLBLADDER AND BILE DUCTS: Cholecystectomy. Similar dilation of the common bile duct measuring up to 17 mm in diameter. Interval placement of a biliary stent with pigtails in the common bile duct and distal duodenum. Mucosal Hyperenhancement and wall thickening of the common bile duct may be related to recent stent placement or cholangitis. SPLEEN: No acute abnormality. PANCREAS: No pancreatic ductal dilation. No peripancreatic inflammatory stranding or fluid. ADRENAL GLANDS: No acute abnormality. KIDNEYS, URETERS AND BLADDER: No stones in the kidneys or ureters. No hydronephrosis. No perinephric or periureteral stranding. Urinary bladder is unremarkable. GI AND BOWEL: Stomach demonstrates no acute abnormality. 5.8 cm pigtail stent in the descending colon. Question translocation of a previous biliary stent. Correlate with history. History. There is no bowel obstruction. PERITONEUM AND RETROPERITONEUM: Small volume free fluid in the pelvis. No intraabdominal or pelvic abscess. No free intraperitoneal air. VASCULATURE: Aorta is normal in caliber. LYMPH NODES: No lymphadenopathy. REPRODUCTIVE ORGANS: No acute abnormality. BONES AND SOFT TISSUES: No acute osseous abnormality. No  focal soft tissue abnormality. IMPRESSION: 1. Interval placement of a biliary stent with pigtails in the common bile duct and distal duodenum;  mucosal hyperenhancement and wall thickening of the dilated common bile duct may reflect post-stent changes or cholangitis. 2. 5.8 cm pigtail stent in the descending colon, which may represent migration of a prior biliary stent; correlate with history. Electronically signed by: Norman Gatlin MD 09/08/2024 06:23 PM EDT RP Workstation: [id number]    Microbiology: Results for orders placed or performed during the hospital encounter of 09/04/21  Urine Culture     Status: Abnormal   Collection Time: 09/04/21  2:46 PM   Specimen: In/Out Cath Urine  Result Value Ref Range Status   Specimen Description   Final    IN/OUT CATH URINE Performed at [facility], [address] [place], [place] [address]    Special Requests   Final    NONE Performed at [facility], [address] [place], [place] [address]    Culture (A)  Final    >=100,000 COLONIES/mL LACTOBACILLUS SPECIES Standardized susceptibility testing for this organism is not available. Performed at [facility], [address] [place], [place] [address]    Report Status 09/06/2021 FINAL  Final    Labs: CBC: Recent Labs  Lab 09/08/24 1538 09/09/24 0441 09/10/24 0456 09/11/24 0435  WBC 11.9* 14.1* 11.4* 8.8  HGB 12.6 11.0* 10.6* 10.4*  HCT 39.9 34.3* 33.3* 32.8*  MCV 63.7* 63.8* 63.9* 63.7*  PLT 472* 408* 335 317   Basic Metabolic Panel: Recent Labs  Lab 09/08/24 1538 09/09/24 0441 09/10/24 0456 09/11/24 0435  NA 141 140 138 137  K 4.3 3.5 3.2* 3.1*  CL 102 106 102 100  CO2 25 24 26 28   GLUCOSE 124* 108* 73 104*  BUN 11 11 13 11   CREATININE 0.70 0.72 0.63 0.77  CALCIUM 10.0 8.6* 8.7* 8.7*   Liver Function Tests: Recent Labs  Lab 09/08/24 1538 09/09/24 0441 09/10/24 0456 09/11/24 0435  AST 23 17 18 19   ALT 14 11 11 12   ALKPHOS 81 68 73 71  BILITOT 0.6 0.9 1.2 0.9  PROT 7.8 6.8 6.7 6.7  ALBUMIN 4.3 3.6 3.7 3.7   CBG: No results for input(s): GLUCAP in the last 168 hours.  Discharge time spent:   35 minutes.  Signed: Eric Nunnery, MD [facility] 09/11/2024

## 2024-09-12 ENCOUNTER — Encounter (INDEPENDENT_AMBULATORY_CARE_PROVIDER_SITE_OTHER): Payer: Self-pay

## 2024-09-12 ENCOUNTER — Telehealth (INDEPENDENT_AMBULATORY_CARE_PROVIDER_SITE_OTHER): Payer: Self-pay | Admitting: Gastroenterology

## 2024-09-12 DIAGNOSIS — K8681 Exocrine pancreatic insufficiency: Secondary | ICD-10-CM

## 2024-09-12 LAB — IGG 4: IgG, Subclass 4: 25 mg/dL (ref 2–96)

## 2024-09-12 NOTE — Telephone Encounter (Signed)
 Pt left voicemail returning call. Returned call. Spoke with pt and pts daughter. Pt daughter states that the pharmacy is trying to get in touch with us  because we sent in Creon 1.5 capsules and pharmacy states they can not cut tablets in half. Advised daughter that we did not send in Creon, that the ER did and they will have to call the ER and let them know what's going on. Pt daughter verbalized understanding.

## 2024-09-12 NOTE — Telephone Encounter (Signed)
 Left message to return call. Sent my chart message to patient also.

## 2024-09-12 NOTE — Telephone Encounter (Signed)
 Pt is aware of her FU OV with Dr Cinderella, but has questions about a medication. [phone]

## 2024-09-12 NOTE — Telephone Encounter (Signed)
 I spoke with the patient and her daughter they say the Ed doctor had sent in creon 24000-76000/ doctor had wrote for the patient to take 1.5 capsules (36,000 units total po TID before meals). They can not half a capsule, and Tanya spoke with the patient and her daughter earlier and told them to call the Ed to have them resubmit to the [facility] in [place]. The Ed told them they can not send in the script now as the patient is no longer at the Ed, that they would have to reach out to the GI office to re submit the script. Please advise.

## 2024-09-12 NOTE — Telephone Encounter (Signed)
 Contacted pt and spoke with her and her daughter

## 2024-09-13 ENCOUNTER — Telehealth (INDEPENDENT_AMBULATORY_CARE_PROVIDER_SITE_OTHER): Payer: Self-pay | Admitting: Gastroenterology

## 2024-09-13 MED ORDER — PANCRELIPASE (LIP-PROT-AMYL) 36000-114000 UNITS PO CPEP: ORAL_CAPSULE | ORAL | 11 refills | Status: AC

## 2024-09-13 NOTE — Telephone Encounter (Signed)
 I spoke with the patient and made her aware per Dr. Cinderella,  Please advice ,if she has any fever/chills or worsening pain to come to the ED as she may need assessment       Patient states understanding and says she currently does not have any fever, chills,but if pain worsens and does develop a fever, chills or worsening pain she will go to the ED.

## 2024-09-13 NOTE — Addendum Note (Signed)
 Addended by: CINDERELLA DEATRICE SMILES on: 09/13/2024 11:09 AM   Modules accepted: Orders

## 2024-09-13 NOTE — Telephone Encounter (Signed)
 Please advice ,if she has any fever/chills or worsening pain to come to the ED as she may need assessment

## 2024-09-13 NOTE — Telephone Encounter (Signed)
 Pt left voicemail that she is needing nausea medication sent in that the hospital was suppose to send in. Returned call to pt and she states the hospital was suppose to call in nausea med but they did not and can we do it for her. Pt states I like the one that dissolves under your tongue. Please advise. Thank you [employer]

## 2024-09-13 NOTE — Telephone Encounter (Signed)
 I spoke with the patient and made her aware the Medication has been sent to the pharmacy as requested. Patient made aware to contact the Doctor at [facility] and says she has an up coming appointment with him on 09/26/2024. She also wanted me to mention she is still having the stabbing sensation under her RUQ.

## 2024-09-13 NOTE — Telephone Encounter (Signed)
 Patient is calling back today to follow up on the Creon being sent in to her drug store [facility].

## 2024-09-18 ENCOUNTER — Ambulatory Visit: Payer: Self-pay | Admitting: Gastroenterology

## 2024-09-26 ENCOUNTER — Telehealth: Payer: Self-pay | Admitting: Gastroenterology

## 2024-09-26 ENCOUNTER — Encounter (INDEPENDENT_AMBULATORY_CARE_PROVIDER_SITE_OTHER): Payer: Self-pay | Admitting: Gastroenterology

## 2024-09-26 NOTE — Telephone Encounter (Signed)
 Patient's OV was moved to sooner and she is aware. Daughter said that patient continues to have bad abdominal pain with nausea and the medication isn't helping. [phone]

## 2024-09-26 NOTE — Telephone Encounter (Signed)
 Pt daughter Philippe contacted. Pt went to [facility] today and had the stint removed, [facility] stated bile duct was back to normal. Pt is still having abdominal pain in top area of abdomen that stretches across entire abdomen along with nausea. [facility] advised them to follow up with Dr.Ahmed to pinpoint why pt is still having pain and nausea. Pt daughter states sometimes pt is unable to eat. Pt app is next Wednesday 10/03/24. Pt daughter pt is taking all prescribed medications. Please advise. Thank you.

## 2024-09-27 NOTE — Telephone Encounter (Signed)
 Pt daughter Philippe contacted and verbalized understanding.

## 2024-10-03 ENCOUNTER — Telehealth (INDEPENDENT_AMBULATORY_CARE_PROVIDER_SITE_OTHER): Payer: Self-pay

## 2024-10-03 ENCOUNTER — Encounter (INDEPENDENT_AMBULATORY_CARE_PROVIDER_SITE_OTHER): Payer: Self-pay | Admitting: Gastroenterology

## 2024-10-03 ENCOUNTER — Ambulatory Visit (INDEPENDENT_AMBULATORY_CARE_PROVIDER_SITE_OTHER): Admitting: Gastroenterology

## 2024-10-03 VITALS — BP 100/65 | HR 81 | Temp 97.2°F | Ht <= 58 in | Wt 109.7 lb

## 2024-10-03 DIAGNOSIS — Z860101 Personal history of adenomatous and serrated colon polyps: Secondary | ICD-10-CM

## 2024-10-03 DIAGNOSIS — R112 Nausea with vomiting, unspecified: Secondary | ICD-10-CM | POA: Insufficient documentation

## 2024-10-03 DIAGNOSIS — Z800 Family history of malignant neoplasm of digestive organs: Secondary | ICD-10-CM

## 2024-10-03 DIAGNOSIS — R197 Diarrhea, unspecified: Secondary | ICD-10-CM | POA: Diagnosis not present

## 2024-10-03 DIAGNOSIS — K838 Other specified diseases of biliary tract: Secondary | ICD-10-CM | POA: Diagnosis not present

## 2024-10-03 DIAGNOSIS — K909 Intestinal malabsorption, unspecified: Secondary | ICD-10-CM | POA: Diagnosis not present

## 2024-10-03 DIAGNOSIS — R1013 Epigastric pain: Secondary | ICD-10-CM

## 2024-10-03 MED ORDER — OMEPRAZOLE 40 MG PO CPDR
40.0000 mg | DELAYED_RELEASE_CAPSULE | Freq: Every day | ORAL | 3 refills | Status: AC
Start: 2024-10-03 — End: ?

## 2024-10-03 NOTE — Patient Instructions (Addendum)
 It was very nice to meet you today, as dicussed with will plan for the following :  1) CT HEAD and CT ABDOMEN AND PELVIS  2) LABS  3) Omperazole 40mg  , 30 min before breakfast  4) Keep diary of foods which may trigger it   5) Hematology referral

## 2024-10-03 NOTE — Telephone Encounter (Signed)
 PA on RADMD for CT Angio:  Pending  Tracking :  [id number]

## 2024-10-03 NOTE — Progress Notes (Signed)
 Patient [name] , M.D. Gastroenterology & Hepatology [facility] Gastroenterology [address] [place], [place] [address] Primary Care Physician: Orpha Yancey LABOR, MD [address] [place] [address]  Chief Complaint: abdominal pain, post prandial diarrhea, nausea and vomiting   History of Present Illness: [name] is a 48 y.o. female asthma who presents for evaluation of abdominal pain, post prandial diarrhea, nausea and vomiting   Patient is coming in with daughter.  Reports severe symptoms with mostly postprandial diarrhea and nausea.  Patient reports persistent right upper quadrant pain.  Reports symptoms have been going on for a while but worsened after recent EUS ERCP. SABRA  Patient has been taking Protonix  30 minutes before breakfast.  Patient diarrhea and nausea and vomiting is mostly postprandial but abdominal pain persists throughout the day .  Reports that all food is going  through her body  The patient denies having any nausea, vomiting, fever, chills, hematochezia, melena, hematemesis, abdominal distention, abdominal pain, diarrhea, jaundice, pruritus or weight loss.   [facility];  seen by Dr. Alois, Biliary endoscopist.  He performed an EGD, EUS and therapeutic ERCP 9/3 followed by stent removal 09/26/2024 .   Labs from 09/2024 ALP 105 ALT 11 AST 60 Hemoglobin 10.5 09/26/2024  IgG4 normal CA 19-9: 39 ( 3 points above normal)   ERCP with stent removal  No strictures/masses/stones  Persistent CBD dilation without underlying pathology   EUS  08/2024   Endoscopic Ultrasound:  A linear echoendoscope was used for the examination.  Biliary system: CBD ~18 mm. It measured 13 mm in the distal CBD and  terminated abruptly at the ampulla.  Ampulla: There was a slight hypoechogenicity of the ampullary region  without obvious underlying mass.  Pancreas: Normal parenchyma in the head, uncinate, neck, body, and  tail.  No masses or cysts visualized.  Pancreas duct: No dilation in the head, neck, body, or tail.  Liver: Visualized portions of the right/left liver were normal.  Esophagus: Normal wall layers.  Stomach: Normal wall layers.  Duodenum: Normal wall layers.  Lymphadenopathy: None visualized.  Vasculature: Normal   Impression  EUS demonstrates CBD dilation and ampullary fullness without obvious mass    Prominent appearing ampulla markedly dilated biliary tree with acute tapering at the ampulla confirmed.  Biliary sphincterotomy with biliary sphincterotomy dilation performed  -  plastic stents placed in both the pancreatic and bile ducts.   Biopsies of the ampulla negative for malignancy CA 19-9  (3) points above normal A.  AMPULLA, BIOPSY:              Benign epithelium with reactive changes in an inflamed background.              Negative for dysplasia or carcinoma   Last EGD:05/2024   - Benign- appearing esophageal stenosis. Dilated. Biopsied. - Erythematous mucosa in the antrum. Biopsied. Outpatient - Normal duodenal bulb and second portion of the duodenum.   Last Colonoscopy:05/2024   - One 5 mm polyp in the ascending colon, removed with a cold snare. Resected and retrieved. - Tortuous colon. - Non- bleeding internal hemorrhoids.   A. STOMACH BIOPSY:  - Mild reactive gastropathy.  - Negative for H. pylori on HE stain  - No intestinal metaplasia, dysplasia, or malignancy.   B. ESOPHAGEAL BIOPSY:  - Benign squamous mucosa with mild chronic inflammation  - Negative for increased intraepithelial eosinophils  - Negative for dysplasia or malignancy   C. COLON  ASCENDING POLYPECTOMY:  - Tubular adenoma.  - No high grade dysplasia or malignancy.    FHx: Mother pancreatic cancer- 61 Surgical: Cholecystectomy  Past Medical History: Past Medical History:  Diagnosis Date   Anxiety    Asthma    COPD (chronic obstructive pulmonary disease) (HCC)    Depression    GERD  (gastroesophageal reflux disease)    Stroke (HCC)    left side weakness    Past Surgical History: Past Surgical History:  Procedure Laterality Date   BLADDER SURGERY     CHOLECYSTECTOMY     COLONOSCOPY N/A 06/07/2024   Procedure: COLONOSCOPY;  Surgeon: Cinderella Deatrice FALCON, MD;  [facility]: [facility];  Service: Endoscopy;  Laterality: N/A;  1:15 pm, asa 1/2   ESOPHAGOGASTRODUODENOSCOPY N/A 04/10/2014   Procedure: ESOPHAGOGASTRODUODENOSCOPY (EGD);  Surgeon: Claudis RAYMOND Rivet, MD;  [facility]: [facility];  Service: Endoscopy;  Laterality: N/A;   ESOPHAGOGASTRODUODENOSCOPY N/A 06/07/2024   Procedure: EGD (ESOPHAGOGASTRODUODENOSCOPY);  Surgeon: Cinderella Deatrice FALCON, MD;  Location: [facility];  Service: Endoscopy;  Laterality: N/A;    Family History: Family History  Problem Relation Age of Onset   Cancer Mother    Pancreatic cancer Mother    Stroke Mother    Seizures Mother    Diabetes Father    Hypertension Father     Social History: Social History   Tobacco Use  Smoking Status Every Day   Current packs/day: 1.00   Average packs/day: 1 pack/day for 13.0 years (13.0 ttl pk-yrs)   Types: Cigarettes  Smokeless Tobacco Never   Social History   Substance and Sexual Activity  Alcohol Use No   Social History   Substance and Sexual Activity  Drug Use Not Currently   Comment: no MJ since age 71 per pt but + UDS 2008    Allergies: No Known Allergies  Medications: Current Outpatient Medications  Medication Sig Dispense Refill   albuterol  (VENTOLIN  HFA) 108 (90 Base) MCG/ACT inhaler Inhale 2 puffs into the lungs every 6 (six) hours as needed for wheezing or shortness of breath. 3 each 0   ALPRAZolam  (XANAX ) 0.5 MG tablet Take 0.5 mg by mouth 2 (two) times daily.     dicyclomine  (BENTYL ) 20 MG tablet Take 0.5 tablets (10 mg total) by mouth 3 (three) times daily before meals. 45 tablet 3   DULoxetine  (CYMBALTA ) 60 MG capsule Take 60 mg by mouth daily.     feeding supplement  (BOOST HIGH PROTEIN) LIQD Take 237 mLs by mouth 2 (two) times daily between meals. 60 mL 2   linaclotide  (LINZESS ) 72 MCG capsule Take 1 capsule (72 mcg total) by mouth daily before breakfast. 30 capsule 2   lipase/protease/amylase (CREON) 36000 UNITS CPEP capsule Take 2 capsules (72,000 Units total) by mouth 3 (three) times daily with meals. May also take 1 capsule (36,000 Units total) as needed (with snacks - up to 4 snacks daily). 300 capsule 11   pantoprazole  (PROTONIX ) 40 MG tablet Take 1 tablet (40 mg total) by mouth daily. 60 tablet 3   SUBOXONE 8-2 MG FILM Place 1 Film under the tongue every 8 (eight) hours.     No current facility-administered medications for this visit.    Review of Systems: GENERAL: negative for malaise, night sweats HEENT: No changes in hearing or vision, no nose bleeds or other nasal problems. NECK: Negative for lumps, goiter, pain and significant neck swelling RESPIRATORY: Negative for cough, wheezing CARDIOVASCULAR: Negative for chest pain, leg swelling, palpitations, orthopnea GI: SEE  HPI MUSCULOSKELETAL: Negative for joint pain or swelling, back pain, and muscle pain. SKIN: Negative for lesions, rash HEMATOLOGY Negative for prolonged bleeding, bruising easily, and swollen nodes. ENDOCRINE: Negative for cold or heat intolerance, polyuria, polydipsia and goiter. NEURO: negative for tremor, gait imbalance, syncope and seizures. The remainder of the review of systems is noncontributory.   Physical Exam: BP 100/65   Pulse 81   Temp (!) 97.2 F (36.2 C)   Ht 4' 9 (1.448 m)   Wt 109 lb 11.2 oz (49.8 kg)   BMI 23.74 kg/m  GENERAL: The patient is AO x3, in no acute distress. HEENT: Head is normocephalic and atraumatic. EOMI are intact. Mouth is well hydrated and without lesions. NECK: Supple. No masses LUNGS: Clear to auscultation. No presence of rhonchi/wheezing/rales. Adequate chest expansion HEART: RRR, normal s1 and s2. ABDOMEN: Soft,diffuse  abdominal tenderness, no guarding, no peritoneal signs, and nondistended. BS +. No masses.  Imaging/Labs: as above     Latest Ref Rng & Units 09/11/2024    4:35 AM 09/10/2024    4:56 AM 09/09/2024    4:41 AM  CBC  WBC 4.0 - 10.5 K/uL 8.8  11.4  14.1   Hemoglobin 12.0 - 15.0 g/dL 89.5  89.3  88.9   Hematocrit 36.0 - 46.0 % 32.8  33.3  34.3   Platelets 150 - 400 K/uL 317  335  408    Lab Results  Component Value Date   IRON 77 04/08/2014   TIBC 299 04/08/2014   FERRITIN 26 04/08/2014    I personally reviewed and interpreted the available labs, imaging and endoscopic files.  CT ABDOMEN AND PELVIS 08/2024   IMPRESSION: 1. Interval placement of a biliary stent with pigtails in the common bile duct and distal duodenum; mucosal hyperenhancement and wall thickening of the dilated common bile duct may reflect post-stent changes or cholangitis. 2. 5.8 cm pigtail stent in the descending colon, which may represent migration of a prior biliary stent; correlate with history.  MRI ABDOMEN 07/2024   IMPRESSION: Biliary ductal dilatation, with abrupt termination at the ampulla, and possible ampullary mass/carcinoma. No evidence of choledocholithiasis. ERCP/EUS is recommended for further evaluation.   No evidence of pancreatic mass or pancreatic ductal dilatation.  Impression and Plan:  [name] is a 48 y.o. female asthma , family history of pancreatic cancer in mother who presents for evaluation of abdominal pain, post prandial diarrhea, nausea and vomiting   #Persistent nausea vomiting and diarrhea #CBD dilation #Abdominal pain  #Family history of pancreatic cancer  Patient had bidirectional endoscopy with dilation of esophagus leading to resolution of dysphagia and colonoscopy with removal of diminutive tubular adenoma polyp.  Given family history of pancreatic cancer had MRI done demonstrating biliary duct dilation followed by EUS and ERCP with stent placement (08/2024) ,  negative biopsies and no overt pathology was found at [facility]. Last endoscopic evaluation with Dr Alois 09/2024  ERCP with stent removal  No strictures/masses/stones  Persistent CBD dilation without underlying pathology   At this time severe persistent symptoms of nausea vomiting and diarrhea; etiology remains unknown   Recs  -CT angio abdomen pelvis -CT head for persistent nausea -Will change Protonix  to omeprazole  -Labs including cortisol level, alpha gal celiac and CMP -Zofran    #Microcytic anemia  No vitamin B12 folate or iron deficiency identified,  Up to date to upper endoscopy colonoscopy -Hematology referral  #Dysphagia   Significant improvement in dysphagia after dilation, intermittent dysphagia currently because patient dentation is  not adequate.  Will follow-up with dentist   # Colon polyp.   Colonoscopy  in 7 years.   All questions were answered.      Nicole Krapf Faizan Dasan Hardman, MD Gastroenterology and Hepatology [facility] Gastroenterology   This chart has been completed using [facility] Dictation software, and while attempts have been made to ensure accuracy , certain words and phrases may not be transcribed as intended

## 2024-10-08 ENCOUNTER — Ambulatory Visit (INDEPENDENT_AMBULATORY_CARE_PROVIDER_SITE_OTHER): Admitting: Gastroenterology

## 2024-10-16 ENCOUNTER — Telehealth: Payer: Self-pay | Admitting: *Deleted

## 2024-10-16 DIAGNOSIS — R1013 Epigastric pain: Secondary | ICD-10-CM

## 2024-10-16 DIAGNOSIS — Z800 Family history of malignant neoplasm of digestive organs: Secondary | ICD-10-CM

## 2024-10-16 DIAGNOSIS — R112 Nausea with vomiting, unspecified: Secondary | ICD-10-CM

## 2024-10-16 DIAGNOSIS — K838 Other specified diseases of biliary tract: Secondary | ICD-10-CM

## 2024-10-16 DIAGNOSIS — K909 Intestinal malabsorption, unspecified: Secondary | ICD-10-CM

## 2024-10-16 NOTE — Telephone Encounter (Signed)
 PA pending review via [employer] Tracking# [id number]

## 2024-10-16 NOTE — Telephone Encounter (Signed)
 PA for CTA abd/pelvis was denied.  Per notes Denial Rationale:  On 10/08/24, we asked your provider for the following important facts or documents: notes from your doctor explaining why you need this test. The notes we received didn't tell us  enough about your problem or why this test is needed. We want to know more about the problem you are having and how this test will help your doctor decide the best way to treat you.  Without this additional information, your request did not meet criteria for approval found in [employer] Clinical Guideline 069 for Abdomen Pelvis CTA. COMMENTS:  Your provider has 5 business days to request a peer to peer review by calling: [phone]  Called for peer to peer and was advised the ordering provider will need to call to discuss at [phone]. Tracking# [id number]  Please advise Dr. Cinderella, thanks!

## 2024-10-18 NOTE — Telephone Encounter (Signed)
 Patient insurance is denying CT head: Notes does not document any problems (such as a problem with the nerves to your eyes).  We also need notes from your doctor that say why you cannot have an MRI (Magnetic Resonance Imaging). MRI  (Magnetic Resonance Imaging) is better because it makes clearer pictures, does not use x-rays and shows nerves and  muscles better.  Without this additional information, your request did not meet criteria for approval found in [employer] Clinical Guideline

## 2024-10-24 NOTE — Telephone Encounter (Signed)
 Tracking #[id number] Request ID: [id number] DOS: 10/24/2024-11/23/2024  CTA scheduled for 12/3, arrival 10am at [facility].  Called pt, LMOVM to call back. Will also send a mychart message

## 2024-10-24 NOTE — Addendum Note (Signed)
 Addended by: JEANELL GRAEME RAMAN on: 10/24/2024 04:11 PM   Modules accepted: Orders

## 2024-10-24 NOTE — Telephone Encounter (Signed)
 Hello I did Peer to Peer and CTA ABDOMEN AND PELVIS was approved but they denied CT head saying there is no other neurological deficits documented . Fax will be received at   [phone]

## 2024-10-26 ENCOUNTER — Inpatient Hospital Stay: Attending: Hematology | Admitting: Hematology

## 2024-10-26 ENCOUNTER — Other Ambulatory Visit (HOSPITAL_COMMUNITY): Payer: Self-pay | Admitting: Gastroenterology

## 2024-10-26 ENCOUNTER — Encounter: Payer: Self-pay | Admitting: Hematology

## 2024-10-26 ENCOUNTER — Other Ambulatory Visit (HOSPITAL_COMMUNITY): Payer: Self-pay | Admitting: Hematology

## 2024-10-26 ENCOUNTER — Inpatient Hospital Stay

## 2024-10-26 VITALS — BP 125/71 | HR 16 | Temp 98.0°F | Resp 16 | Ht <= 58 in | Wt 117.3 lb

## 2024-10-26 DIAGNOSIS — Z716 Tobacco abuse counseling: Secondary | ICD-10-CM | POA: Insufficient documentation

## 2024-10-26 DIAGNOSIS — K909 Intestinal malabsorption, unspecified: Secondary | ICD-10-CM

## 2024-10-26 DIAGNOSIS — D509 Iron deficiency anemia, unspecified: Secondary | ICD-10-CM | POA: Insufficient documentation

## 2024-10-26 DIAGNOSIS — K838 Other specified diseases of biliary tract: Secondary | ICD-10-CM

## 2024-10-26 DIAGNOSIS — R1013 Epigastric pain: Secondary | ICD-10-CM

## 2024-10-26 DIAGNOSIS — F17210 Nicotine dependence, cigarettes, uncomplicated: Secondary | ICD-10-CM | POA: Insufficient documentation

## 2024-10-26 DIAGNOSIS — Z1231 Encounter for screening mammogram for malignant neoplasm of breast: Secondary | ICD-10-CM

## 2024-10-26 DIAGNOSIS — R112 Nausea with vomiting, unspecified: Secondary | ICD-10-CM

## 2024-10-26 LAB — IRON AND TIBC
Iron: 63 ug/dL (ref 28–170)
Saturation Ratios: 17 % (ref 10.4–31.8)
TIBC: 365 ug/dL (ref 250–450)
UIBC: 303 ug/dL

## 2024-10-26 LAB — RETIC PANEL
Immature Retic Fract: 17.5 % — ABNORMAL HIGH (ref 2.3–15.9)
RBC.: 4.74 MIL/uL (ref 3.87–5.11)
Retic Count, Absolute: 78.7 K/uL (ref 19.0–186.0)
Retic Ct Pct: 1.7 % (ref 0.4–3.1)
Reticulocyte Hemoglobin: 21.9 pg — ABNORMAL LOW (ref >27.9)

## 2024-10-26 LAB — HM COLONOSCOPY

## 2024-10-26 LAB — FERRITIN: Ferritin: 25 ng/mL (ref 11–307)

## 2024-10-26 NOTE — Progress Notes (Signed)
 [facility]   [phone] [phone]   Clinic New Consult Note   Patient Care Team: Orpha Yancey LABOR, MD as PCP - General (Internal Medicine) 10/26/2024  CHIEF COMPLAINTS/PURPOSE OF CONSULTATION:  Microcytic anemia   REFERRING PHYSICIAN: Cinderella Deatrice FALCON, MD   Discussed the use of AI scribe software for clinical note transcription with the patient, who gave verbal consent to proceed.  History of Present Illness [name] is a 48 year old female who presents for a new consult for microcytic anemia. She was referred by her GI Dr. Cinderella for anemia.  She has microcytic anemia first noted in 2015, with hemoglobin levels between 10.6 and 12 and a consistently low MCV in the 60 range. Despite occasional normalization of hemoglobin levels, her MCV remains low. She recalls being anemic as a teenager. Her mother and her daughter also have anemia, and there is a family history of pancreatic cancer in her mother.  She denies any current bleeding, such as blood in stool or hematemesis. She has undergone endoscopy and CT scans, which have not revealed any cancer. She has a bile duct stent and a stent in the descending colon.  Her past medical history includes COPD, asthma, anxiety, depression, and a stroke in 2010 with residual left-sided weakness. She has had a cholecystectomy. She has been smoking since age 69, currently about a pack a day, and is attempting to reduce smoking. She does not consume alcohol. She is retired and has two children.     MEDICAL HISTORY:  Past Medical History:  Diagnosis Date   Anxiety    Asthma    COPD (chronic obstructive pulmonary disease) (HCC)    Depression    GERD (gastroesophageal reflux disease)    Stroke (HCC)    left side weakness    SURGICAL HISTORY: Past Surgical History:  Procedure Laterality Date   BLADDER SURGERY     CHOLECYSTECTOMY     COLONOSCOPY N/A 06/07/2024   Procedure: COLONOSCOPY;  Surgeon:  Cinderella Deatrice FALCON, MD;  [facility]: [facility];  Service: Endoscopy;  Laterality: N/A;  1:15 pm, asa 1/2   ESOPHAGOGASTRODUODENOSCOPY N/A 04/10/2014   Procedure: ESOPHAGOGASTRODUODENOSCOPY (EGD);  Surgeon: Claudis RAYMOND Rivet, MD;  Location: [facility];  Service: Endoscopy;  Laterality: N/A;   ESOPHAGOGASTRODUODENOSCOPY N/A 06/07/2024   Procedure: EGD (ESOPHAGOGASTRODUODENOSCOPY);  Surgeon: Cinderella Deatrice FALCON, MD;  Location: [facility];  Service: Endoscopy;  Laterality: N/A;    SOCIAL HISTORY: Social History   Socioeconomic History   Marital status: Widowed    Spouse name: Not on file   Number of children: 2   Years of education: Not on file   Highest education level: Not on file  Occupational History   Not on file  Tobacco Use   Smoking status: Every Day    Current packs/day: 1.00    Average packs/day: 1 pack/day for 34.9 years (34.9 ttl pk-yrs)    Types: Cigarettes    Start date: 34   Smokeless tobacco: Never  Vaping Use   Vaping status: Never Used  Substance and Sexual Activity   Alcohol use: No   Drug use: Not Currently    Comment: no MJ since age 44 per pt but + UDS 2008   Sexual activity: Not Currently    Birth control/protection: Condom  Other Topics Concern   Not on file  Social History Narrative   Not on file   Social Drivers of Health   Financial Resource Strain: Not on  file  Food Insecurity: Patient Declined (09/08/2024)   Hunger Vital Sign    Worried About Running Out of Food in the Last Year: Patient declined    Ran Out of Food in the Last Year: Patient declined  Transportation Needs: Patient Declined (09/08/2024)   PRAPARE - Administrator, Civil Service (Medical): Patient declined    Lack of Transportation (Non-Medical): Patient declined  Physical Activity: Not on file  Stress: Not on file  Social Connections: Not on file  Intimate Partner Violence: Patient Declined (09/08/2024)   Humiliation, Afraid, Rape, and Kick questionnaire    Fear  of Current or Ex-Partner: Patient declined    Emotionally Abused: Patient declined    Physically Abused: Patient declined    Sexually Abused: Patient declined    FAMILY HISTORY: Family History  Problem Relation Age of Onset   Cancer Mother 4       pancreatic cancer   Pancreatic cancer Mother    Stroke Mother    Seizures Mother    Diabetes Father    Hypertension Father     ALLERGIES:  has no known allergies.  MEDICATIONS:  Current Outpatient Medications  Medication Sig Dispense Refill   albuterol  (VENTOLIN  HFA) 108 (90 Base) MCG/ACT inhaler Inhale 2 puffs into the lungs every 6 (six) hours as needed for wheezing or shortness of breath. 3 each 0   ALPRAZolam  (XANAX ) 0.5 MG tablet Take 0.5 mg by mouth 2 (two) times daily.     dicyclomine  (BENTYL ) 20 MG tablet Take 0.5 tablets (10 mg total) by mouth 3 (three) times daily before meals. 45 tablet 3   DULoxetine  (CYMBALTA ) 60 MG capsule Take 60 mg by mouth daily.     feeding supplement (BOOST HIGH PROTEIN) LIQD Take 237 mLs by mouth 2 (two) times daily between meals. 60 mL 2   linaclotide  (LINZESS ) 72 MCG capsule Take 1 capsule (72 mcg total) by mouth daily before breakfast. 30 capsule 2   lipase/protease/amylase (CREON) 36000 UNITS CPEP capsule Take 2 capsules (72,000 Units total) by mouth 3 (three) times daily with meals. May also take 1 capsule (36,000 Units total) as needed (with snacks - up to 4 snacks daily). 300 capsule 11   omeprazole  (PRILOSEC) 40 MG capsule Take 1 capsule (40 mg total) by mouth daily. 90 capsule 3   SUBOXONE 8-2 MG FILM Place 1 Film under the tongue every 8 (eight) hours.     No current facility-administered medications for this visit.    REVIEW OF SYSTEMS:   Constitutional: Denies fevers, chills or abnormal night sweats Eyes: Denies blurriness of vision, double vision or watery eyes Ears, nose, mouth, throat, and face: Denies mucositis or sore throat Respiratory: Denies cough, dyspnea or  wheezes Cardiovascular: Denies palpitation, chest discomfort or lower extremity swelling Gastrointestinal:  Denies nausea, heartburn or change in bowel habits Skin: Denies abnormal skin rashes Lymphatics: Denies new lymphadenopathy or easy bruising Neurological:Denies numbness, tingling or new weaknesses Behavioral/Psych: Mood is stable, no new changes  All other systems were reviewed with the patient and are negative.  PHYSICAL EXAMINATION: ECOG PERFORMANCE STATUS: 1 - Symptomatic but completely ambulatory  Vitals:   10/26/24 1356 10/26/24 1401  BP: (!) 150/79 125/71  Pulse: (!) 16   Resp: 16   Temp: 98 F (36.7 C)   SpO2: 98%    Filed Weights   10/26/24 1356  Weight: 117 lb 4.6 oz (53.2 kg)    GENERAL:alert, no distress and comfortable SKIN: skin color, texture, turgor are  normal, no rashes or significant lesions EYES: normal, conjunctiva are pink and non-injected, sclera clear OROPHARYNX:no exudate, no erythema and lips, buccal mucosa, and tongue normal  NECK: supple, thyroid normal size, non-tender, without nodularity LYMPH:  no palpable lymphadenopathy in the cervical, axillary or inguinal LUNGS: clear to auscultation and percussion with normal breathing effort HEART: regular rate & rhythm and no murmurs and no lower extremity edema ABDOMEN:abdomen soft, non-tender and normal bowel sounds Musculoskeletal:no cyanosis of digits and no clubbing  PSYCH: alert & oriented x 3 with fluent speech NEURO: no focal motor/sensory deficits  Physical Exam   LABORATORY DATA:  I have reviewed the data as listed    Latest Ref Rng & Units 09/11/2024    4:35 AM 09/10/2024    4:56 AM 09/09/2024    4:41 AM  CBC  WBC 4.0 - 10.5 K/uL 8.8  11.4  14.1   Hemoglobin 12.0 - 15.0 g/dL 89.5  89.3  88.9   Hematocrit 36.0 - 46.0 % 32.8  33.3  34.3   Platelets 150 - 400 K/uL 317  335  408        Latest Ref Rng & Units 09/11/2024    4:35 AM 09/10/2024    4:56 AM 09/09/2024    4:41 AM  CMP   Glucose 70 - 99 mg/dL 895  73  891   BUN 6 - 20 mg/dL 11  13  11    Creatinine 0.44 - 1.00 mg/dL 9.22  9.36  9.27   Sodium 135 - 145 mmol/L 137  138  140   Potassium 3.5 - 5.1 mmol/L 3.1  3.2  3.5   Chloride 98 - 111 mmol/L 100  102  106   CO2 22 - 32 mmol/L 28  26  24    Calcium 8.9 - 10.3 mg/dL 8.7  8.7  8.6   Total Protein 6.5 - 8.1 g/dL 6.7  6.7  6.8   Total Bilirubin 0.0 - 1.2 mg/dL 0.9  1.2  0.9   Alkaline Phos 38 - 126 U/L 71  73  68   AST 15 - 41 U/L 19  18  17    ALT 0 - 44 U/L 12  11  11       RADIOGRAPHIC STUDIES: I have personally reviewed the radiological images as listed and agreed with the findings in the report. No results found.   Assessment & Plan Microcytic anemia with evaluation for suspected thalassemia Chronic microcytic anemia with low MCV, likely due to thalassemia or iron deficiency.  Given the very low normal MCV, suspect that she has thalassemia.  She has family history of anemia but has not been tested about. Previous labs show hemoglobin levels between 10.6 and 11.8 with consistently low MCV. No significant bleeding or gastrointestinal cancer. - Ordered hemoglobin electrophoresis to evaluate for beta thalassemia. - If beta thalassemia test is negative, will order alpha thalassemia genetic screening.  Active smoker Long-standing nicotine dependence with smoking since age 71, currently averaging one pack per day. Previous attempts to quit have been unsuccessful. Discussed the risks of continued smoking, including lung cancer, and the benefits of cessation. - Ordered low-dose CT chest for lung cancer screening. - Discussed smoking cessation options, including medications and patches.  Screening for breast cancer (mammogram) Due for breast cancer screening. No recent mammogram performed. - Ordered mammogram for breast cancer screening.  Plan -lab today, if hemoglobin electrophoresis is normal, will order alpha thalassemia genetic testing - Low-dose CT  chest for lung cancer screening -  Ordered screening mammogram for breast cancer screening - Will call her 3 to 4 weeks with the above test results   Orders Placed This Encounter  Procedures   CT CHEST LUNG CA SCREEN LOW DOSE W/O CM    Standing Status:   Future    Expected Date:   11/09/2024    Expiration Date:   10/26/2025    Preferred Imaging Location?:   [facility]    Release to patient:   Immediate [1]    Is patient pregnant?:   No   MM Digital Screening    Standing Status:   Future    Expected Date:   11/25/2024    Expiration Date:   10/26/2025    Scheduling Instructions:     Or [facility] if available    Reason for Exam (SYMPTOM  OR DIAGNOSIS REQUIRED):   screening    Is the patient pregnant?:   No    Preferred Imaging Location?:   [facility]             other   Iron and TIBC ([facility] ONLY)    Standing Status:   Future    Expected Date:   10/26/2024    Expiration Date:   10/26/2025   Retic Panel    Standing Status:   Future    Expected Date:   10/26/2024    Expiration Date:   10/26/2025   Hgb Fractionation Cascade    Standing Status:   Future    Expiration Date:   10/26/2025   Ferritin    Standing Status:   Future    Expected Date:   10/26/2024    Expiration Date:   10/26/2025    All questions were answered. The patient knows to call the clinic with any problems, questions or concerns. I spent 25 minutes counseling the patient face to face. The total time spent in the appointment was 30 minutes including review of chart and various tests results, discussions about plan of care and coordination of care plan.     Onita Mattock, MD 10/26/2024 2:28 PM

## 2024-10-28 LAB — HGB FRACTIONATION CASCADE
Hgb A2: 4.7 % — ABNORMAL HIGH (ref 1.8–3.2)
Hgb A: 95.3 % — ABNORMAL LOW (ref 96.4–98.8)
Hgb F: 0 % (ref 0.0–2.0)
Hgb S: 0 %

## 2024-10-29 ENCOUNTER — Encounter (INDEPENDENT_AMBULATORY_CARE_PROVIDER_SITE_OTHER): Payer: Self-pay | Admitting: *Deleted

## 2024-10-29 ENCOUNTER — Ambulatory Visit (INDEPENDENT_AMBULATORY_CARE_PROVIDER_SITE_OTHER): Payer: Self-pay | Admitting: Gastroenterology

## 2024-10-29 ENCOUNTER — Encounter (INDEPENDENT_AMBULATORY_CARE_PROVIDER_SITE_OTHER): Payer: Self-pay

## 2024-10-29 NOTE — Progress Notes (Signed)
 Referral sent, they will contact patient with apt

## 2024-10-29 NOTE — Progress Notes (Signed)
 Hi Ann ,  Can you please arrange a referral for this patient to Dr. Lenis Frames   Diagnosis: Low cortisol, persistent nausea, concerning for adrenal insufficiency   ================== Hi Tanya ,  Can you please call the patient and tell the patient the lab work  shows low cortisol which is seen patient whose adrenal gland is not working well , and can cause nausea and vomiting  I recommend seeing endocrinologist , I will refer you to one .  Thanks,  Rickeya Manus Faizan Letti Towell, MD Gastroenterology and Hepatology [facility] =======================  Cortisol 2.4 (low) Normal liver enzymes Hemoglobin 9.8 MCV 67 CRP negative Celiac and alpha gal pending ( in process)

## 2024-10-30 LAB — CELIAC AB TTG DGP TIGA
Deamidated Gliadin Abs, IgA: 3 U (ref 0–19)
Deamidated Gliadin Abs, IgG: 2 U (ref 0–19)
Immunoglobulin A, (IgA) QN, Serum: 295 mg/dL (ref 87–352)
t-Transglutaminase (tTG) IgA: <2 U/mL (ref 0–3)
t-Transglutaminase (tTG) IgG: <2 U/mL (ref 0–5)

## 2024-10-30 LAB — COMPREHENSIVE METABOLIC PANEL WITH GFR
ALT: 16 IU/L (ref 0–32)
AST: 22 IU/L (ref 0–40)
Albumin: 4.8 g/dL (ref 3.9–4.9)
Alkaline Phosphatase: 84 IU/L (ref 41–116)
BUN/Creatinine Ratio: 12 (ref 9–23)
BUN: 9 mg/dL (ref 6–24)
Bilirubin Total: 0.3 mg/dL (ref 0.0–1.2)
CO2: 29 mmol/L (ref 20–29)
Calcium: 9.6 mg/dL (ref 8.7–10.2)
Chloride: 98 mmol/L (ref 96–106)
Creatinine, Ser: 0.78 mg/dL (ref 0.57–1.00)
Globulin, Total: 2.3 g/dL (ref 1.5–4.5)
Glucose: 145 mg/dL — ABNORMAL HIGH (ref 70–99)
Potassium: 3.5 mmol/L (ref 3.5–5.2)
Sodium: 142 mmol/L (ref 134–144)
Total Protein: 7.1 g/dL (ref 6.0–8.5)
eGFR: 93 mL/min/1.73 (ref >59)

## 2024-10-30 LAB — CBC
Hematocrit: 32.1 % — ABNORMAL LOW (ref 34.0–46.6)
Hemoglobin: 9.8 g/dL — ABNORMAL LOW (ref 11.1–15.9)
MCH: 20.4 pg — ABNORMAL LOW (ref 26.6–33.0)
MCHC: 30.5 g/dL — ABNORMAL LOW (ref 31.5–35.7)
MCV: 67 fL — ABNORMAL LOW (ref 79–97)
Platelets: 276 x10E3/uL (ref 150–450)
RBC: 4.8 x10E6/uL (ref 3.77–5.28)
RDW: 17.7 % — ABNORMAL HIGH (ref 11.7–15.4)
WBC: 9.1 x10E3/uL (ref 3.4–10.8)

## 2024-10-30 LAB — C-REACTIVE PROTEIN: CRP: <1 mg/L (ref 0–10)

## 2024-10-30 LAB — ALPHA-GAL PANEL
Allergen Lamb IgE: <0.1 kU/L
Beef IgE: <0.1 kU/L
IgE (Immunoglobulin E), Serum: 145 [IU]/mL (ref 6–495)
O215-IgE Alpha-Gal: <0.1 kU/L
Pork IgE: <0.1 kU/L

## 2024-10-30 LAB — CORTISOL: Cortisol: 2.4 ug/dL — ABNORMAL LOW (ref 6.2–19.4)

## 2024-11-01 ENCOUNTER — Encounter (HOSPITAL_COMMUNITY): Payer: Self-pay | Admitting: Radiology

## 2024-11-01 ENCOUNTER — Ambulatory Visit (HOSPITAL_COMMUNITY)
Admission: RE | Admit: 2024-11-01 | Discharge: 2024-11-01 | Disposition: A | Discharge status: Home / Self Care | Source: Ambulatory Visit | Attending: Hematology | Admitting: Hematology

## 2024-11-01 DIAGNOSIS — Z1231 Encounter for screening mammogram for malignant neoplasm of breast: Secondary | ICD-10-CM | POA: Diagnosis present

## 2024-11-12 NOTE — Addendum Note (Signed)
 Addended by: JEANELL GRAEME RAMAN on: 11/12/2024 02:20 PM   Modules accepted: Orders

## 2024-11-12 NOTE — Telephone Encounter (Signed)
 Received message from pre-service center that CTA CHEST/ABD/PELVIS was not authorized only abd/pelvis was (not chest). Dr. Cinderella had ordered CTA ABD/PELVIS at OV only. Spoke with Dr. Cinderella and patient oncologist wanted chest added for lung cancer screening. Advised PA was not done for chest and patient appt is 12/3 for CTA. He advised me to change order back to CTA ABD/PELVIS since this is what was approved.

## 2024-11-14 ENCOUNTER — Ambulatory Visit (HOSPITAL_COMMUNITY)

## 2024-11-20 ENCOUNTER — Inpatient Hospital Stay: Attending: Oncology | Admitting: Oncology

## 2024-11-20 DIAGNOSIS — D563 Thalassemia minor: Secondary | ICD-10-CM | POA: Insufficient documentation

## 2024-11-20 DIAGNOSIS — D509 Iron deficiency anemia, unspecified: Secondary | ICD-10-CM | POA: Insufficient documentation

## 2024-11-20 DIAGNOSIS — D508 Other iron deficiency anemias: Secondary | ICD-10-CM

## 2024-11-20 MED ORDER — FERROUS GLUCONATE 324 (38 FE) MG PO TABS: 324.0000 mg | ORAL_TABLET | ORAL | 3 refills | Status: AC

## 2024-11-20 NOTE — Assessment & Plan Note (Signed)
 Hemoglobin electrophoresis shows beta thalassemia minor. Most recent labs show hemoglobin of 9.8 with an MCV of 68. She is also iron deficient with low ferritin and iron saturation. We discussed going ahead and giving her 1 dose of INFeD and she can start taking ferrous gluconate  every other day with a source of vitamin C. Recheck labs in 3 months.

## 2024-11-20 NOTE — Progress Notes (Signed)
 [facility]   [phone] Fax:([phone]   Clinic New Consult Note   Patient Care Team: Orpha Yancey LABOR, MD as PCP - General (Internal Medicine) 11/20/2024  I connected with [name] Staff on 11/20/24 at  2:30 PM EST by telephone visit and verified that I am speaking with the correct person using two identifiers.   I discussed the limitations, risks, security and privacy concerns of performing an evaluation and management service by telemedicine and the availability of in-person appointments. I also discussed with the patient that there may be a patient responsible charge related to this service. The patient expressed understanding and agreed to proceed.   Other persons participating in the visit and their role in the encounter:   Patient's location: Home  Provider's location: Clinic     CHIEF COMPLAINTS/PURPOSE OF CONSULTATION:  Microcytic anemia   REFERRING PHYSICIAN: Cinderella Deatrice FALCON, MD   History of Present Illness [name] is a 48 year old female who presents for a new consult for microcytic anemia. She was referred by her GI Dr. Cinderella for anemia.  She has microcytic anemia first noted in 2015, with hemoglobin levels between 10.6 and 12 and a consistently low MCV in the 60 range. Despite occasional normalization of hemoglobin levels, her MCV remains low. She recalls being anemic as a teenager. Her mother and her daughter also have anemia, and there is a family history of pancreatic cancer in her mother.  She denies any current bleeding, such as blood in stool or hematemesis. She has undergone endoscopy and CT scans, which have not revealed any cancer. She has a bile duct stent and a stent in the descending colon.  Her past medical history includes COPD, asthma, anxiety, depression, and a stroke in 2010 with residual left-sided weakness. She has had a cholecystectomy. She has been smoking since age 16, currently about a pack a day, and is  attempting to reduce smoking. She does not consume alcohol. She is retired and has two children.  Patient presents back today to discuss most recent lab results.  Overall, patient is feeling very tired with low energy levels.  Appetite is at 100%.  She has pain 6 out of 10 in her  abdomen.  Reports she has follow-up with GI tomorrow and has an appoint with endocrinology for low cortisol levels in January.   MEDICAL HISTORY:  Past Medical History:  Diagnosis Date   Anxiety    Asthma    COPD (chronic obstructive pulmonary disease) (HCC)    Depression    GERD (gastroesophageal reflux disease)    Stroke (HCC)    left side weakness    SURGICAL HISTORY: Past Surgical History:  Procedure Laterality Date   BLADDER SURGERY     CHOLECYSTECTOMY     COLONOSCOPY N/A 06/07/2024   Procedure: COLONOSCOPY;  Surgeon: Cinderella Deatrice FALCON, MD;  [facility];  Service: Endoscopy;  Laterality: N/A;  1:15 pm, asa 1/2   ESOPHAGOGASTRODUODENOSCOPY N/A 04/10/2014   Procedure: ESOPHAGOGASTRODUODENOSCOPY (EGD);  Surgeon: Claudis RAYMOND Rivet, MD;  Location: [facility];  Service: Endoscopy;  Laterality: N/A;   ESOPHAGOGASTRODUODENOSCOPY N/A 06/07/2024   Procedure: EGD (ESOPHAGOGASTRODUODENOSCOPY);  Surgeon: Cinderella Deatrice FALCON, MD;  Location: [facility];  Service: Endoscopy;  Laterality: N/A;    SOCIAL HISTORY: Social History   Socioeconomic History   Marital status: Widowed    Spouse name: Not on file   Number of children: 2   Years of education: Not on  file   Highest education level: Not on file  Occupational History   Not on file  Tobacco Use   Smoking status: Every Day    Current packs/day: 1.00    Average packs/day: 1 pack/day for 34.9 years (34.9 ttl pk-yrs)    Types: Cigarettes    Start date: 2   Smokeless tobacco: Never  Vaping Use   Vaping status: Never Used  Substance and Sexual Activity   Alcohol use: No   Drug use: Not Currently    Comment: no MJ since age 12 per pt but +  UDS 2008   Sexual activity: Not Currently    Birth control/protection: Condom  Other Topics Concern   Not on file  Social History Narrative   Not on file   Social Drivers of Health   Financial Resource Strain: Not on file  Food Insecurity: Medium Risk (10/29/2024)   Received from [facility]   Hunger Vital Sign    Within the past 12 months, you worried that your food would run out before you got money to buy more: Never true    Within the past 12 months, the food you bought just didn't last and you didn't have money to get more. : Sometimes true  Transportation Needs: No Transportation Needs (10/29/2024)   Received from [facility]    In the past 12 months, has lack of reliable transportation kept you from medical appointments, meetings, work or from getting things needed for daily living? : No  Physical Activity: Not on file  Stress: Not on file  Social Connections: Not on file  Intimate Partner Violence: Patient Declined (09/08/2024)   Humiliation, Afraid, Rape, and Kick questionnaire    Fear of Current or Ex-Partner: Patient declined    Emotionally Abused: Patient declined    Physically Abused: Patient declined    Sexually Abused: Patient declined    FAMILY HISTORY: Family History  Problem Relation Age of Onset   Cancer Mother 56       pancreatic cancer   Pancreatic cancer Mother    Stroke Mother    Seizures Mother    Diabetes Father    Hypertension Father     ALLERGIES:  has no known allergies.  MEDICATIONS:  Current Outpatient Medications  Medication Sig Dispense Refill   ferrous gluconate  (FERGON) 324 MG tablet Take 1 tablet (324 mg total) by mouth every other day. 90 tablet 3   albuterol  (VENTOLIN  HFA) 108 (90 Base) MCG/ACT inhaler Inhale 2 puffs into the lungs every 6 (six) hours as needed for wheezing or shortness of breath. 3 each 0   ALPRAZolam  (XANAX ) 0.5 MG tablet Take 0.5 mg by mouth 2 (two) times daily.     dicyclomine  (BENTYL ) 20  MG tablet Take 0.5 tablets (10 mg total) by mouth 3 (three) times daily before meals. 45 tablet 3   DULoxetine  (CYMBALTA ) 60 MG capsule Take 60 mg by mouth daily.     feeding supplement (BOOST HIGH PROTEIN) LIQD Take 237 mLs by mouth 2 (two) times daily between meals. 60 mL 2   linaclotide  (LINZESS ) 72 MCG capsule Take 1 capsule (72 mcg total) by mouth daily before breakfast. 30 capsule 2   lipase/protease/amylase (CREON ) 36000 UNITS CPEP capsule Take 2 capsules (72,000 Units total) by mouth 3 (three) times daily with meals. May also take 1 capsule (36,000 Units total) as needed (with snacks - up to 4 snacks daily). 300 capsule 11   omeprazole  (PRILOSEC) 40 MG capsule Take  1 capsule (40 mg total) by mouth daily. 90 capsule 3   SUBOXONE  8-2 MG FILM Place 1 Film under the tongue every 8 (eight) hours.     No current facility-administered medications for this visit.    REVIEW OF SYSTEMS:   Review of Systems  Constitutional:  Positive for malaise/fatigue.  Gastrointestinal:  Positive for abdominal pain.  Neurological:  Positive for weakness.    PHYSICAL EXAMINATION: ECOG PERFORMANCE STATUS: 1 - Symptomatic but completely ambulatory  There were no vitals filed for this visit.  There were no vitals filed for this visit.   Physical Exam Neurological:     Mental Status: She is alert and oriented to person, place, and time.      Physical Exam   LABORATORY DATA:  I have reviewed the data as listed    Latest Ref Rng & Units 10/26/2024    1:39 PM 09/11/2024    4:35 AM 09/10/2024    4:56 AM  CBC  WBC 3.4 - 10.8 x10E3/uL 9.1  8.8  11.4   Hemoglobin 11.1 - 15.9 g/dL 9.8  89.5  89.3   Hematocrit 34.0 - 46.6 % 32.1  32.8  33.3   Platelets 150 - 450 x10E3/uL 276  317  335        Latest Ref Rng & Units 10/26/2024    1:39 PM 09/11/2024    4:35 AM 09/10/2024    4:56 AM  CMP  Glucose 70 - 99 mg/dL 854  895  73   BUN 6 - 24 mg/dL 9  11  13    Creatinine 0.57 - 1.00 mg/dL 9.21  9.22  9.36    Sodium 134 - 144 mmol/L 142  137  138   Potassium 3.5 - 5.2 mmol/L 3.5  3.1  3.2   Chloride 96 - 106 mmol/L 98  100  102   CO2 20 - 29 mmol/L 29  28  26    Calcium 8.7 - 10.2 mg/dL 9.6  8.7  8.7   Total Protein 6.0 - 8.5 g/dL 7.1  6.7  6.7   Total Bilirubin 0.0 - 1.2 mg/dL 0.3  0.9  1.2   Alkaline Phos 41 - 116 IU/L 84  71  73   AST 0 - 40 IU/L 22  19  18    ALT 0 - 32 IU/L 16  12  11       RADIOGRAPHIC STUDIES: I have personally reviewed the radiological images as listed and agreed with the findings in the report. MM 3D SCREENING MAMMOGRAM BILATERAL BREAST Result Date: 11/06/2024 CLINICAL DATA:  Screening. EXAM: DIGITAL SCREENING BILATERAL MAMMOGRAM WITH TOMOSYNTHESIS AND CAD TECHNIQUE: Bilateral screening digital craniocaudal and mediolateral oblique mammograms were obtained. Bilateral screening digital breast tomosynthesis was performed. The images were evaluated with computer-aided detection. COMPARISON:  None available. ACR Breast Density Category b: There are scattered areas of fibroglandular density. FINDINGS: There are no findings suspicious for malignancy. IMPRESSION: No mammographic evidence of malignancy. A result letter of this screening mammogram will be mailed directly to the patient. RECOMMENDATION: Screening mammogram in one year. (Code:SM-B-01Y) BI-RADS CATEGORY  1: Negative. Electronically Signed   By: Dina  Arceo M.D.   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 Most recent labs show hemoglobin of 9.8 with an MCV of 68. She is also iron deficient with low ferritin and iron saturation. We discussed going ahead and giving her 1 dose of  INFeD and she can start taking ferrous gluconate  every other day with a source of vitamin C. No bleeding per patient.  No recent colonoscopy. Beta thalassemia minor (also called beta thalassemia trait) is a carrier condition in which an  individual is heterozygous for a beta+ or beta0 thalassemia mutation. Beta thalassemia minor is often an asymptomatic carrier state. Individuals with beta thalassemia minor may have mild anemia; most are asymptomatic but exhibit marked microcytosis that can be mistaken for iron deficiency. Recheck labs in 3 months.  Active smoker Long-standing nicotine dependence with smoking since age 40, currently averaging one pack per day. Previous attempts to quit have been unsuccessful. Discussed the risks of continued smoking, including lung cancer, and the benefits of cessation. - Ordered low-dose CT chest for lung cancer screening. - Discussed smoking cessation options, including medications and patches.  Screening for breast cancer (mammogram) Due for breast cancer screening.  Most recent mammogram from 11/01/2024 was negative.  Repeat in 1 year.    Orders Placed This Encounter  Procedures   CBC with Differential    Standing Status:   Future    Expected Date:   02/18/2025    Expiration Date:   05/19/2025   Iron and TIBC ([facility] ONLY)    Standing Status:   Future    Expected Date:   02/18/2025    Expiration Date:   05/19/2025   Ferritin    Standing Status:   Future    Expected Date:   02/18/2025    Expiration Date:   05/19/2025   Comprehensive metabolic panel    Standing Status:   Future    Expected Date:   02/18/2025    Expiration Date:   05/19/2025   Methylmalonic acid, serum    Standing Status:   Future    Expected Date:   02/18/2025    Expiration Date:   05/19/2025   I spent 25 minutes dedicated to the care of this patient (face-to-face and non-face-to-face) on the date of the encounter to include what is described in the assessment and plan.   Delon FORBES Hope, NP 11/20/2024 3:00 PM

## 2024-11-21 ENCOUNTER — Ambulatory Visit (INDEPENDENT_AMBULATORY_CARE_PROVIDER_SITE_OTHER): Admitting: Gastroenterology

## 2024-11-23 ENCOUNTER — Inpatient Hospital Stay

## 2024-11-30 ENCOUNTER — Inpatient Hospital Stay

## 2024-12-03 NOTE — Telephone Encounter (Addendum)
 Patient rescheduled CTA to 12/24 outside of valid date of service.  Called [employer] and was advised will have to start a new authorization as they are not able to change the approval dates.   New PA submitted. Pending clinical review. Tracking # [id number]

## 2024-12-04 NOTE — Telephone Encounter (Signed)
 PA still pending.

## 2024-12-05 ENCOUNTER — Ambulatory Visit (HOSPITAL_COMMUNITY)

## 2024-12-05 ENCOUNTER — Ambulatory Visit (INDEPENDENT_AMBULATORY_CARE_PROVIDER_SITE_OTHER): Admitting: Gastroenterology

## 2024-12-10 ENCOUNTER — Encounter: Payer: Self-pay | Admitting: *Deleted

## 2024-12-10 NOTE — Telephone Encounter (Signed)
 PA approved [id number] DOS: 12/03/2024-01/02/2025

## 2024-12-14 ENCOUNTER — Inpatient Hospital Stay: Attending: Oncology

## 2024-12-14 VITALS — BP 100/65 | HR 71 | Temp 96.6°F | Resp 18

## 2024-12-14 DIAGNOSIS — D508 Other iron deficiency anemias: Secondary | ICD-10-CM

## 2024-12-14 DIAGNOSIS — D509 Iron deficiency anemia, unspecified: Secondary | ICD-10-CM | POA: Insufficient documentation

## 2024-12-14 MED ORDER — CETIRIZINE HCL 10 MG/ML IV SOLN
10.0000 mg | Freq: Once | INTRAVENOUS | Status: AC
  Administered 2024-12-14: 10 mg via INTRAVENOUS
  Filled 2024-12-14: qty 1

## 2024-12-14 MED ORDER — SODIUM CHLORIDE 0.9 % IV SOLN
950.0000 mg | Freq: Once | INTRAVENOUS | Status: AC
  Administered 2024-12-14: 950 mg via INTRAVENOUS
  Filled 2024-12-14: qty 19

## 2024-12-14 MED ORDER — SODIUM CHLORIDE 0.9 % IV SOLN
50.0000 mg | Freq: Once | INTRAVENOUS | Status: AC
  Administered 2024-12-14: 50 mg via INTRAVENOUS
  Filled 2024-12-14: qty 1

## 2024-12-14 MED ORDER — FAMOTIDINE IN NACL 20-0.9 MG/50ML-% IV SOLN
20.0000 mg | Freq: Once | INTRAVENOUS | Status: AC
  Administered 2024-12-14: 20 mg via INTRAVENOUS
  Filled 2024-12-14: qty 50

## 2024-12-14 MED ORDER — SODIUM CHLORIDE 0.9 % IV SOLN: INTRAVENOUS | Status: DC

## 2024-12-14 MED ORDER — ACETAMINOPHEN 325 MG PO TABS
650.0000 mg | ORAL_TABLET | Freq: Once | ORAL | Status: AC
  Administered 2024-12-14: 650 mg via ORAL
  Filled 2024-12-14: qty 2

## 2024-12-14 MED ORDER — METHYLPREDNISOLONE SODIUM SUCC 125 MG IJ SOLR
125.0000 mg | Freq: Once | INTRAMUSCULAR | Status: AC
  Administered 2024-12-14: 125 mg via INTRAVENOUS
  Filled 2024-12-14: qty 2

## 2024-12-14 NOTE — Patient Instructions (Signed)
 [facility] [place] - A DEPT OF [facility]  Discharge Instructions: Thank you for choosing [facility] to provide your oncology and hematology care.  If you have a lab appointment with the Cancer Center - please note that after April 8th, 2024, all labs will be drawn in the cancer center.  You do not have to check in or register with the main entrance as you have in the past but will complete your check-in in the cancer center.  Wear comfortable clothing and clothing appropriate for easy access to any Portacath or PICC line.   We strive to give you quality time with your provider. You may need to reschedule your appointment if you arrive late (15 or more minutes).  Arriving late affects you and other patients whose appointments are after yours.  Also, if you miss three or more appointments without notifying the office, you may be dismissed from the clinic at the provider's discretion.      For prescription refill requests, have your pharmacy contact our office and allow 72 hours for refills to be completed.    Today you received the following chemotherapy and/or immunotherapy agents Infed      To help prevent nausea and vomiting after your treatment, we encourage you to take your nausea medication as directed.  BELOW ARE SYMPTOMS THAT SHOULD BE REPORTED IMMEDIATELY: *FEVER GREATER THAN 100.4 F (38 C) OR HIGHER *CHILLS OR SWEATING *NAUSEA AND VOMITING THAT IS NOT CONTROLLED WITH YOUR NAUSEA MEDICATION *UNUSUAL SHORTNESS OF BREATH *UNUSUAL BRUISING OR BLEEDING *URINARY PROBLEMS (pain or burning when urinating, or frequent urination) *BOWEL PROBLEMS (unusual diarrhea, constipation, pain near the anus) TENDERNESS IN MOUTH AND THROAT WITH OR WITHOUT PRESENCE OF ULCERS (sore throat, sores in mouth, or a toothache) UNUSUAL RASH, SWELLING OR PAIN  UNUSUAL VAGINAL DISCHARGE OR ITCHING   Items with * indicate a potential emergency and should be followed up as  soon as possible or go to the Emergency Department if any problems should occur.  Please show the CHEMOTHERAPY ALERT CARD or IMMUNOTHERAPY ALERT CARD at check-in to the Emergency Department and triage nurse.  Should you have questions after your visit or need to cancel or reschedule your appointment, please contact [facility] [place] - A DEPT OF [facility] [phone]  and follow the prompts.  Office hours are 8:00 a.m. to 4:30 p.m. Monday - Friday. Please note that voicemails left after 4:00 p.m. may not be returned until the following business day.  We are closed weekends and major holidays. You have access to a nurse at all times for urgent questions. Please call the main number to the clinic [phone] and follow the prompts.  For any non-urgent questions, you may also contact your provider using MyChart. We now offer e-Visits for anyone 6 and older to request care online for non-urgent symptoms. For details visit [email or web address].   Also download the MyChart app! Go to the app store, search "MyChart", open the app, select [place], and log in with your MyChart username and password.

## 2024-12-14 NOTE — Progress Notes (Signed)
Patient presents today for Infed infusion per providers order.  Vital signs WNL.  Patient has no new complaints at this time.    Peripheral IV started and blood return noted pre and post infusion.    Infed infusion without adverse affects.  Vital signs stable.  No complaints at this time.  Discharge from clinic ambulatory in stable condition.  Alert and oriented X 3.  Follow up with [facility] as scheduled.

## 2024-12-21 ENCOUNTER — Encounter: Payer: Self-pay | Admitting: Oncology

## 2024-12-25 ENCOUNTER — Encounter: Payer: Self-pay | Admitting: "Endocrinology

## 2024-12-25 ENCOUNTER — Ambulatory Visit: Payer: Self-pay | Admitting: "Endocrinology

## 2024-12-25 ENCOUNTER — Ambulatory Visit (HOSPITAL_COMMUNITY)
Admission: RE | Admit: 2024-12-25 | Discharge: 2024-12-25 | Disposition: A | Discharge status: Home / Self Care | Source: Ambulatory Visit | Attending: Family Medicine | Admitting: Family Medicine

## 2024-12-25 ENCOUNTER — Other Ambulatory Visit (HOSPITAL_COMMUNITY): Payer: Self-pay | Admitting: Family Medicine

## 2024-12-25 VITALS — BP 104/78 | HR 72 | Ht <= 58 in | Wt 128.2 lb

## 2024-12-25 DIAGNOSIS — E2749 Other adrenocortical insufficiency: Secondary | ICD-10-CM | POA: Insufficient documentation

## 2024-12-25 DIAGNOSIS — D72825 Bandemia: Secondary | ICD-10-CM

## 2024-12-25 NOTE — Progress Notes (Signed)
 "          Endocrinology Consult Note                                            12/25/2024, 4:42 PM   Subjective:    Patient ID: [name], female    DOB: [birth year], PCP Orpha Yancey LABOR, MD   Past Medical History:  Diagnosis Date   Anxiety    Asthma    COPD (chronic obstructive pulmonary disease) (HCC)    Depression    GERD (gastroesophageal reflux disease)    Stroke (HCC)    left side weakness   Past Surgical History:  Procedure Laterality Date   BLADDER SURGERY     CHOLECYSTECTOMY     COLONOSCOPY N/A 06/07/2024   Procedure: COLONOSCOPY;  Surgeon: Cinderella Deatrice FALCON, MD;  [facility]: [facility];  Service: Endoscopy;  Laterality: N/A;  1:15 pm, asa 1/2   ESOPHAGOGASTRODUODENOSCOPY N/A 04/10/2014   Procedure: ESOPHAGOGASTRODUODENOSCOPY (EGD);  Surgeon: Claudis RAYMOND Rivet, MD;  Location: [facility];  Service: Endoscopy;  Laterality: N/A;   ESOPHAGOGASTRODUODENOSCOPY N/A 06/07/2024   Procedure: EGD (ESOPHAGOGASTRODUODENOSCOPY);  Surgeon: Cinderella Deatrice FALCON, MD;  Location: [facility];  Service: Endoscopy;  Laterality: N/A;   Social History   Socioeconomic History   Marital status: Widowed    Spouse name: Not on file   Number of children: 2   Years of education: Not on file   Highest education level: Not on file  Occupational History   Not on file  Tobacco Use   Smoking status: Every Day    Current packs/day: 1.00    Average packs/day: 1 pack/day for 35.0 years (35.0 ttl pk-yrs)    Types: Cigarettes    Start date: 68   Smokeless tobacco: Never  Vaping Use   Vaping status: Never Used  Substance and Sexual Activity   Alcohol use: No   Drug use: Not Currently    Comment: no MJ since age 4 per pt but + UDS 2008   Sexual activity: Not Currently    Birth control/protection: Condom  Other Topics Concern   Not on file  Social History Narrative   Not on file   Social Drivers of Health   Tobacco Use: High Risk (12/25/2024)   Received from [facility]    Patient History    Smoking Tobacco Use: Every Day    Smokeless Tobacco Use: Never    Passive Exposure: Never  Financial Resource Strain: Not on file  Food Insecurity: Low Risk (12/25/2024)   Received from [facility]   Epic    Within the past 12 months, you worried that your food would run out before you got money to buy more: Never true    Within the past 12 months, the food you bought just didn't last and you didn't have money to get more. : Never true  Recent Concern: Food Insecurity - Medium Risk (10/29/2024)   Received from [facility]   Epic    Within the past 12 months, you worried that your food would run out before you got money to buy more: Never true    Within the past 12 months, the food you bought just didn't last and you didn't have money to get more. : Sometimes true  Transportation Needs: No Transportation Needs (12/25/2024)   Received from [facility]  In the past 12 months, has lack of reliable transportation kept you from medical appointments, meetings, work or from getting things needed for daily living? : No  Physical Activity: Not on file  Stress: Not on file  Social Connections: Not on file  Depression (PHQ2-9): Low Risk (12/14/2024)   Depression (PHQ2-9)    PHQ-2 Score: 0  Alcohol Screen: Not on file  Housing: Low Risk (12/25/2024)   Received from [facility]   Epic    What is your living situation today?: I have a steady place to live    Think about the place you live. Do you have problems with any of the following? Choose all that apply:: None/None on this list  Utilities: Low Risk (12/25/2024)   Received from [facility]   Utilities    In the past 12 months has the electric, gas, oil, or water  company threatened to shut off services in your home? : No  Health Literacy: Not on file   Family History  Problem Relation Age of Onset   Hypertension Mother    Cancer Mother 66       pancreatic cancer   Pancreatic cancer Mother     Stroke Mother    Seizures Mother    Hyperlipidemia Mother    Heart attack Mother    Hyperlipidemia Father    Diabetes Father    Hypertension Father    Heart failure Father    Outpatient Encounter Medications as of 12/25/2024  Medication Sig   albuterol  (VENTOLIN  HFA) 108 (90 Base) MCG/ACT inhaler Inhale 2 puffs into the lungs every 6 (six) hours as needed for wheezing or shortness of breath.   ALPRAZolam  (XANAX ) 0.5 MG tablet Take 0.5 mg by mouth 2 (two) times daily.   dicyclomine  (BENTYL ) 20 MG tablet Take 0.5 tablets (10 mg total) by mouth 3 (three) times daily before meals.   DULoxetine  (CYMBALTA ) 60 MG capsule Take 60 mg by mouth daily.   feeding supplement (BOOST HIGH PROTEIN) LIQD Take 237 mLs by mouth 2 (two) times daily between meals. (Patient not taking: Reported on 12/25/2024)   ferrous gluconate  (FERGON) 324 MG tablet Take 1 tablet (324 mg total) by mouth every other day.   linaclotide  (LINZESS ) 72 MCG capsule Take 1 capsule (72 mcg total) by mouth daily before breakfast.   lipase/protease/amylase (CREON ) 36000 UNITS CPEP capsule Take 2 capsules (72,000 Units total) by mouth 3 (three) times daily with meals. May also take 1 capsule (36,000 Units total) as needed (with snacks - up to 4 snacks daily). (Patient not taking: No sig reported)   omeprazole  (PRILOSEC) 40 MG capsule Take 1 capsule (40 mg total) by mouth daily.   SUBOXONE  8-2 MG FILM Place 1 Film under the tongue every 8 (eight) hours.   No facility-administered encounter medications on file as of 12/25/2024.   ALLERGIES: Allergies[1]  VACCINATION STATUS: Immunization History  Administered Date(s) Administered   Pneumococcal Polysaccharide-23 02/19/2012, 04/12/2014    HPI [name] is 49 y.o. female who presents today with a medical history as above. she is being seen in consultation for hypocortisolism requested by Orpha Yancey LABOR, MD.   Patient is accompanied by her daughter to clinic.  Patient with medical  history of diffuse pain syndrome, chronic smoking with COPD.  She reports exposure to opioids for more than 10 years, currently on Suboxone  8 mg every 8 hours. Due to abdominal pain and diffuse arthralgias, she was offered adrenal assessment with random cortisol.  She did have  a level of 2.4 at 1:40 PM.  This was consistent with hypercortisolism.  She is not on current steroid treatment. She denies weight loss, nausea/vomiting.  She continues to have vague nominal pain for which she is scheduled to see GI. She denies any adrenal surgery, infection nor injury. She denies any family history of adrenal/pituitary syndromes. She is an active smoker.  Her other medications include duloxetine , alprazolam , ferrous Glucodrate , omeprazole , Ventolin  besides Suboxone .  Review of Systems  Constitutional: +mildly fluctuating body weight,  + fatigue, no subjective hyperthermia, no subjective hypothermia Eyes: no blurry vision, no xerophthalmia ENT: no sore throat, no nodules palpated in throat, no dysphagia/odynophagia, no hoarseness, + reported puffy feeling in her face and neck. Cardiovascular: no Chest Pain, no Shortness of Breath, no palpitations, no leg swelling Respiratory: no cough, no shortness of breath Gastrointestinal: no Nausea/Vomiting/Diarhhea Musculoskeletal: no muscle/joint aches Skin: no rashes Neurological: no tremors, no numbness, no tingling, no dizziness Psychiatric: no depression, no anxiety  Objective:       12/25/2024    2:20 PM 12/14/2024   12:45 PM 12/14/2024    8:41 AM  Vitals with BMI  Height 4' 9    Weight 128 lbs 3 oz    BMI 27.73    Systolic 104 100 864  Diastolic 78 65 67  Pulse 72 71 76    BP 104/78   Pulse 72   Ht 4' 9 (1.448 m)   Wt 128 lb 3.2 oz (58.2 kg)   BMI 27.74 kg/m   Wt Readings from Last 3 Encounters:  12/25/24 128 lb 3.2 oz (58.2 kg)  10/26/24 117 lb 4.6 oz (53.2 kg)  10/03/24 109 lb 11.2 oz (49.8 kg)    Physical Exam  Constitutional:  Body  mass index is 27.74 kg/m.,  not in acute distress, normal state of mind Eyes: PERRLA, EOMI, no exophthalmos ENT: moist mucous membranes, no gross thyromegaly, no gross cervical lymphadenopathy Cardiovascular: normal precordial activity, Regular Rate and Rhythm, no Murmur/Rubs/Gallops Respiratory:  adequate breathing efforts, no gross chest deformity, Clear to auscultation bilaterally Gastrointestinal: abdomen soft, Non -tender, No distension, Bowel Sounds present, no gross organomegaly Musculoskeletal: no gross deformities, strength intact in all four extremities, no peripheral edema Skin: moist, warm, no rashes Neurological: no tremor with outstretched hands, Deep tendon reflexes normal in bilateral lower extremities.  CMP ( most recent) CMP     Component Value Date/Time   NA 142 10/26/2024 1339   K 3.5 10/26/2024 1339   CL 98 10/26/2024 1339   CO2 29 10/26/2024 1339   GLUCOSE 145 (H) 10/26/2024 1339   GLUCOSE 104 (H) 09/11/2024 0435   BUN 9 10/26/2024 1339   CREATININE 0.78 10/26/2024 1339   CALCIUM 9.6 10/26/2024 1339   PROT 7.1 10/26/2024 1339   ALBUMIN 4.8 10/26/2024 1339   AST 22 10/26/2024 1339   ALT 16 10/26/2024 1339   ALKPHOS 84 10/26/2024 1339   BILITOT 0.3 10/26/2024 1339   EGFR 93 10/26/2024 1339   GFRNONAA >60 09/11/2024 0435     Diabetic Labs (most recent): Lab Results  Component Value Date   HGBA1C 5.8 (H) 09/30/2021     Lipid Panel ( most recent) Lipid Panel     Component Value Date/Time   CHOL 209 (H) 09/10/2024 1111   TRIG 138 09/10/2024 1111   HDL 42 09/10/2024 1111   CHOLHDL 5.0 09/10/2024 1111   VLDL 28 09/10/2024 1111   LDLCALC 139 (H) 09/10/2024 1111      Lab  Results  Component Value Date   TSH 1.890 04/07/2014     October 26, 2024 labs showed p.m. cortisol 2.4 at 13: 30 9 PM       Assessment & Plan:   1. Hypocortisolemia (Primary)  - TANNAH DREYFUSS  is being seen at a kind request of Hasanaj, Yancey LABOR, MD. - I have reviewed  her available  records and clinically evaluated the patient. - Based on these reviews, she has hypocortisolemia, however not enough information to diagnose adrenal insufficiency.  She has clear risk factor for adrenal insufficiency from her years of exposure to opioids. She will be offered ACTH stimulation test to attempt to assess for adrenal reserve.  If she is confirmed to have adrenal insufficiency, she will be considered for replacement steroids. She is counseled regarding smoking cessation.   - I did not initiate any new prescriptions today. - she is advised to maintain close follow up with Hasanaj, Yancey LABOR, MD for primary care needs.   -Thank you for involving me in the care of this pleasant patient.  Time spent with the patient: 46  minutes spent in  counseling her about hypocortisolism, smoking and the rest in obtaining information about her symptoms, reviewing her previous labs/studies (including abstractions from other facilities),  evaluations, and treatments,  and developing a plan to confirm diagnosis and long term treatment based on the latest standards of care/guidelines; and documenting her care.  [name] participated in the discussions, expressed understanding, and voiced agreement with the above plans.  All questions were answered to her satisfaction. she is encouraged to contact clinic should she have any questions or concerns prior to her return visit.  Follow up plan: Return in about 2 weeks (around 01/08/2025) for F/U with Pre-visit Labs.   Ranny Earl, MD [facility] [address] [place], [place] [address] Phone: [phone]  Fax: [phone]     12/25/2024, 4:42 PM  This note was partially dictated with voice recognition software. Similar sounding words can be transcribed inadequately or may not  be corrected upon review.     [1] No Known Allergies  "

## 2024-12-26 ENCOUNTER — Other Ambulatory Visit (HOSPITAL_COMMUNITY): Payer: Self-pay | Admitting: "Endocrinology

## 2024-12-27 ENCOUNTER — Telehealth: Payer: Self-pay

## 2024-12-27 NOTE — Telephone Encounter (Signed)
 Auth Submission: NO AUTH NEEDED Site of care: Site of care: [facility] Payer: [place] [employer] [place]  Medication & CPT/J Code(s) submitted: cosyntropin [id number] Diagnosis Code:  Route of submission (phone, fax, portal): portal Phone # Fax # Auth type: Buy/Bill HB Units/visits requested: 1 dose Reference number:  Approval from: 12/27/24 to 12/12/25

## 2024-12-31 ENCOUNTER — Telehealth (INDEPENDENT_AMBULATORY_CARE_PROVIDER_SITE_OTHER): Payer: Self-pay | Admitting: Gastroenterology

## 2024-12-31 NOTE — Telephone Encounter (Signed)
 Refill request received from [facility] for Ondansetron  4 mg tablets. Pt last seen 10/03/24 for nausea and vomiting. Ondansetron  not on current med list. Please advise. Thank you.

## 2025-01-01 ENCOUNTER — Ambulatory Visit (HOSPITAL_COMMUNITY)

## 2025-01-02 ENCOUNTER — Ambulatory Visit

## 2025-01-02 MED ORDER — ONDANSETRON HCL 4 MG PO TABS: 4.0000 mg | ORAL_TABLET | Freq: Three times a day (TID) | ORAL | 1 refills | Status: AC | PRN

## 2025-01-02 NOTE — Addendum Note (Signed)
 Addended by: CINDERELLA DEATRICE SMILES on: 01/02/2025 03:07 PM   Modules accepted: Orders

## 2025-01-08 ENCOUNTER — Ambulatory Visit

## 2025-01-08 NOTE — Telephone Encounter (Signed)
 Pt has not been scheduled for her ACTH please advise

## 2025-01-08 NOTE — Telephone Encounter (Signed)
 Re-faxed orders for ACTH stim times 3 to [facility].

## 2025-01-09 ENCOUNTER — Ambulatory Visit (INDEPENDENT_AMBULATORY_CARE_PROVIDER_SITE_OTHER): Admitting: Gastroenterology

## 2025-01-09 ENCOUNTER — Other Ambulatory Visit (HOSPITAL_COMMUNITY): Payer: Self-pay | Admitting: "Endocrinology

## 2025-01-11 ENCOUNTER — Ambulatory Visit

## 2025-01-15 ENCOUNTER — Ambulatory Visit: Admitting: "Endocrinology

## 2025-01-22 ENCOUNTER — Ambulatory Visit

## 2025-02-06 ENCOUNTER — Other Ambulatory Visit (HOSPITAL_COMMUNITY)

## 2025-02-12 ENCOUNTER — Ambulatory Visit: Admitting: "Endocrinology

## 2025-02-18 ENCOUNTER — Inpatient Hospital Stay

## 2025-02-22 ENCOUNTER — Inpatient Hospital Stay: Admitting: Oncology
